# Patient Record
Sex: Female | Born: 1935 | Race: Black or African American | Hispanic: No | Smoking: Never smoker
Health system: Southern US, Community
[De-identification: ages and names within clinical notes are randomized; demographics above are authoritative.]

## PROBLEM LIST (undated history)

## (undated) DIAGNOSIS — N289 Disorder of kidney and ureter, unspecified: Secondary | ICD-10-CM

## (undated) DIAGNOSIS — Z515 Encounter for palliative care: Secondary | ICD-10-CM

## (undated) DIAGNOSIS — F0390 Unspecified dementia without behavioral disturbance: Secondary | ICD-10-CM

## (undated) DIAGNOSIS — E7800 Pure hypercholesterolemia, unspecified: Secondary | ICD-10-CM

## (undated) DIAGNOSIS — I639 Cerebral infarction, unspecified: Secondary | ICD-10-CM

## (undated) DIAGNOSIS — M1990 Unspecified osteoarthritis, unspecified site: Secondary | ICD-10-CM

## (undated) DIAGNOSIS — I10 Essential (primary) hypertension: Secondary | ICD-10-CM

## (undated) DIAGNOSIS — M1711 Unilateral primary osteoarthritis, right knee: Secondary | ICD-10-CM

## (undated) DIAGNOSIS — IMO0002 Reserved for concepts with insufficient information to code with codable children: Secondary | ICD-10-CM

## (undated) HISTORY — PX: ABDOMINAL HYSTERECTOMY: SHX81

## (undated) HISTORY — PX: TOTAL HIP ARTHROPLASTY: SHX124

---

## 2001-12-13 ENCOUNTER — Encounter: Payer: Self-pay | Admitting: Orthopedic Surgery

## 2001-12-19 ENCOUNTER — Encounter: Payer: Self-pay | Admitting: Orthopedic Surgery

## 2001-12-19 ENCOUNTER — Inpatient Hospital Stay (HOSPITAL_COMMUNITY): Admission: RE | Admit: 2001-12-19 | Discharge: 2001-12-23 | Payer: Self-pay | Admitting: Orthopedic Surgery

## 2003-02-22 ENCOUNTER — Emergency Department (HOSPITAL_COMMUNITY): Admission: AD | Admit: 2003-02-22 | Discharge: 2003-02-22 | Payer: Self-pay | Admitting: Emergency Medicine

## 2003-02-22 ENCOUNTER — Emergency Department (HOSPITAL_COMMUNITY): Admission: EM | Admit: 2003-02-22 | Discharge: 2003-02-22 | Payer: Self-pay | Admitting: *Deleted

## 2003-07-31 ENCOUNTER — Ambulatory Visit (HOSPITAL_COMMUNITY): Admission: RE | Admit: 2003-07-31 | Discharge: 2003-07-31 | Payer: Self-pay | Admitting: Gastroenterology

## 2004-02-19 ENCOUNTER — Emergency Department (HOSPITAL_COMMUNITY): Admission: EM | Admit: 2004-02-19 | Discharge: 2004-02-19 | Payer: Self-pay | Admitting: Emergency Medicine

## 2006-12-31 ENCOUNTER — Emergency Department (HOSPITAL_COMMUNITY): Admission: EM | Admit: 2006-12-31 | Discharge: 2007-01-01 | Payer: Self-pay | Admitting: Emergency Medicine

## 2007-05-23 ENCOUNTER — Encounter: Admission: RE | Admit: 2007-05-23 | Discharge: 2007-05-23 | Payer: Self-pay | Admitting: Internal Medicine

## 2009-04-15 ENCOUNTER — Encounter: Admission: RE | Admit: 2009-04-15 | Discharge: 2009-04-15 | Payer: Self-pay | Admitting: Internal Medicine

## 2009-12-24 ENCOUNTER — Emergency Department (HOSPITAL_COMMUNITY): Admission: EM | Admit: 2009-12-24 | Discharge: 2009-12-24 | Payer: Self-pay | Admitting: Emergency Medicine

## 2010-08-14 NOTE — Op Note (Signed)
NAME:  Robin Moreno, Robin Moreno                           ACCOUNT NO.:  [id number]   MEDICAL RECORD NO.:  [id number]                   PATIENT TYPE:  AMB   LOCATION:  ENDO                                 FACILITY:  [facility]   PHYSICIAN:  John C. Madilyn Fireman, M.D.                 DATE OF BIRTH:  1926-01-26   DATE OF PROCEDURE:  07/31/2003  DATE OF DISCHARGE:                                 OPERATIVE REPORT   PROCEDURE:  Colonoscopy.   INDICATION FOR PROCEDURE:  Colon cancer screening in a 77 year old patient  with no prior screening.   DESCRIPTION OF PROCEDURE:  The patient was placed in the left lateral  decubitus position and placed on the pulse monitor with continuous low-flow  oxygen delivered by nasal cannula.  She was sedated with 62.5 mcg IV  fentanyl and 6 mg IV Versed.  The Olympus video colonoscope was inserted  into the rectum and advanced to the cecum, confirmed by transillumination at  McBurney's point and visualization of the ileocecal valve and appendiceal  orifice.  The prep was excellent.  The cecum, ascending, transverse,  descending, and sigmoid colon all appeared normal with no masses, polyps,  diverticula, or other mucosal abnormalities.  The rectum likewise appeared  normal, and retroflexed view of the anus revealed no obvious internal  hemorrhoids.  The scope was then withdrawn and the patient returned to the  recovery room in stable condition.  She tolerated the procedure well, and  there were no immediate complications.   IMPRESSION:  Normal colonoscopy.   PLAN:  Next colon screening by sigmoidoscopy or Hemoccults in 5 years.                                               John C. Madilyn Fireman, M.D.    JCH/MEDQ  D:  07/31/2003  T:  07/31/2003  Job:  [id number]   cc:   Wilson Singer, M.D.  [address]  [place]  [place] [address]  Fax: [phone]

## 2010-08-14 NOTE — Discharge Summary (Signed)
NAME:  Robin Moreno, Robin Moreno NO.:  [id number]   MEDICAL RECORD NO.:  [id number]                   PATIENT TYPE:  INP   LOCATION:  5032                                 FACILITY:  [facility]   PHYSICIAN:  Lenard Galloway. Chaney Malling, M.D.           DATE OF BIRTH:  1971-08-10   DATE OF ADMISSION:  12/19/2001  DATE OF DISCHARGE:  12/23/2001                                 DISCHARGE SUMMARY   ADMISSION DIAGNOSES:  1. End-stage osteoarthritis right hip.  2. Hypertension.  3. History of hemorrhoids.  4. History of degenerative disk disease lumbar and cervical spine.  5. Osteoarthritis right knee.   DISCHARGE DIAGNOSES:  1. End-stage osteoarthritis right hip status post right hip replacement.  2. Acute blood loss anemia secondary to surgery.  3. Urinary tract infection.  4. Mild hypokalemia.  5. Constipation.  6. Hypertension.  7. History of hemorrhoids.  8. Degenerative disk disease lumbar and cervical spine.  9. Osteoarthritis right knee.   SURGICAL PROCEDURE:  On December 19, 2001 the patient underwent a right  total hip arthroplasty by Dr. Rinaldo Ratel assisted by Norlene Campbell,  M.D. and Arnoldo Morale, P.A.-C.   COMPLICATIONS:  None.   CONSULTS:  1. Rehabilitation medicine consult December 20, 2001.  2. Case management consult December 21, 2001.   HISTORY OF PRESENT ILLNESS:  This 77 year old black female patient presented  to Dr. Chaney Malling with a one year history of gradual onset of progressively  worsening right hip pain.  The pain is intermittent, located in the right  groin and trochanter area without radiation.  It increases at night and  decreases with decreasing her activity.  She does have to ambulate with a  cane.  She has failed conservative treatment and because of that she is  presenting for a right hip replacement.   HOSPITAL COURSE:  The patient tolerated her surgical procedure well without  immediate postoperative complications.  She  was transferred to 5000.  On  postoperative day one she was afebrile, vital signs stable.  Pain was well  controlled with PCA.  Hemoglobin was 9.1 with hematocrit of 27.2.  She was  noted on her admission laboratories to have a positive nitrite and leukocyte  esterase on her urinalysis and it is felt she had a UTI and we continued her  Ancef for another 24 hours.  She was started on therapy per protocol.   She continued to do well over the next several days.  She had no symptoms of  urinary tract infection after Foley was discontinued.  Her hemoglobin did  drop to 8.7 with hematocrit of 25.9 on September 25 and she did complain of  some lightheadedness when sitting or quick to move from a sitting to a  standing position.  She was subsequently transfused with 2 units of packed  red blood cells.  She also was noted to have low potassium at the time  with  3.4 and she was given p.o. supplements.   On December 22, 2001 she was doing well and her pain was well controlled  with medications.  Her hemoglobin had improved to 10.7 with a hematocrit of  31.5 and her potassium was 3.5.  She did have some difficulty with  constipation and that was treated with laxative.  It was felt she was ready  for discharge home and she was able to be discharged home subsequently on  September 27.   DISCHARGE INSTRUCTIONS:   DIET:  She can resume her regular pre hospitalization diet.   DISCHARGE MEDICATIONS:  She can resume her pre hospitalization medications  which included:  1. Atenolol/hydrochlorothiazide half tablet p.o. q.d.  2. Bextra 20 mg p.o. q.d.  3. Additional medications include Erixtra 2.5 mg subcutaneously q.d. at 8     p.m. with the last dose on September 29.  4. On September 30 she is to start one baby aspirin a day for a month.  5. OxyContin 10 mg p.o. q.12h. number 24.  6. Percocet 5/325 mg one to two p.o. q.4h. p.r.n. for pain, 50 with no     refill.  7. She is to use a laxative as  needed for constipation.   ACTIVITY:  She is to be out of bed, partial weightbearing 50% on the right  leg with the use of a walker.  She is arranged for home health PT per  [facility].   WOUND CARE:  She needs to keep her incision clean and dry.  May shower after  no drainage from the wound for two days.  She is to notify Dr. Chaney Malling of  temperature greater than or equal to 101.5 degrees Fahrenheit, chills, pain  unrelieved by pain medications, or foul smelling drainage from the wound.   FOLLOW UP:  She needs to follow up with Dr. Chaney Malling in our office on  January 01, 2002.  Needs to call [phone] for that appointment.   RADIOLOGIC FINDINGS:  X-rays taken of her pelvis on December 19, 2001  showed the right hip replacement in good position without evidence of  complication.  A repeat lateral view was recommended just to confirm  anatomic location of the prosthesis.   On September 24 hemoglobin 9.1, hematocrit 27.2.  On September 25 hemoglobin  8.7, hematocrit 25.9.  On September 26 white count 13.9, hemoglobin 10.7,  hematocrit 31.5.   On September 17 BUN 24, creatinine 1.2.  On the 24th glucose 151, calcium  8.1.  On September 25 sodium 138, potassium 3.4, glucose 134.  On September  26 sodium 138, potassium 3.5, chloride 103, CO2 29, glucose 105, BUN 10,  creatinine 1.1, and calcium 8.4.   On September 17 urinalysis showed positive nitrite, trace leukocyte  esterase, few epithelials, 0-2 white cells and red cells and too numerous to  count bacteria.  Urine culture that was obtained on September 26 showed  greater than 100,000 colonies per ml of multiple species and it was felt it  was a contaminate.     Legrand Pitts Duffy, P.A.                      Rodney A. Chaney Malling, M.D.    KED/MEDQ  D:  01/10/2002  T:  01/11/2002  Job:  [id number]

## 2010-08-14 NOTE — Op Note (Signed)
NAME:  Robin Moreno, Robin Moreno                           ACCOUNT NO.:  [id number]   MEDICAL RECORD NO.:  [id number]                   PATIENT TYPE:  INP   LOCATION:  2550                                 FACILITY:  [facility]   PHYSICIAN:  Lenard Galloway. Chaney Malling, M.D.           DATE OF BIRTH:  1925/07/03   DATE OF PROCEDURE:  12/19/2001  DATE OF DISCHARGE:                                 OPERATIVE REPORT   PREOPERATIVE DIAGNOSES:  Osteoarthritis right hip.   POSTOPERATIVE DIAGNOSES:  Osteoarthritis right hip.   OPERATION PERFORMED:  Total hip replacement on the right using an AML 3.5  diameter stem with a large proximal end; a 52 mm Pinnacle acetabular cup,  hole eliminator and +4 acetabular liner with 10 degree lip, 28 mm head.   SURGEON:  Lenard Galloway. Chaney Malling, M.D.   ASSISTANT:  Claude Manges. Cleophas Dunker, M.D. and Legrand Pitts. Duffy, P.A.   ANESTHESIA:  General.   DESCRIPTION OF PROCEDURE:  The patient was placed on the operating table.  After satisfactory oral endotracheal anesthesia, the patient was placed in  full lateral position with the right hip up.  The right hip was prepped with  DuraPrep and draped out in the usual manner.  A ViDrape was placed over the  operative site.  The posterior Southern incision was made.  Skin edges were  retracted.  Bleeders were coagulated.  Tensor fascia lata was split and the  neck was exposed.  An incision was made with the cutting cautery at the base  of the neck starting superiorly and carried inferiorly.  A T-cut was then  made in the capsule up the neck to the labrum.  The hip was then dislocated.  Using  the cutting guide, the neck was then cut with a power saw at the  appropriate length and angle.  Excellent position was achieved.  Using a  series of fully fluted reamers, the femur was reamed out to a 13 mm diameter  and there was good tight fitting of the reamer.  A series of broaches then  passed down the femoral canal until a standard size 13.5 mm diameter  seated  very nicely in the calcar.   Attention was turned to the acetabulum.  Retractors were placed about the  rim of the acetabulum.  Part of the labrum was excised.  A series of cheese  cut reamers was used and this was reamed out to a 51 mm diameter.  Trials  were placed on the hip and 52 mm diameter cup, appeared to seat very nicely.  The hip was irrigated with copious amounts of saline solution followed by  antibiotic solution.  The final pore coated acetabulum was then driven down  into the acetabulum and had a wonderful scratch fit and this was extremely  stable.  There was a large area of osteophytes which was removed with an  osteotome.  A +4 liner trial was  inserted and then trial femoral components  passed down the femoral canal.  A 1.5 mm head was put in place and the hip  was reduced.  This had excellent range of motion and nice stability. The leg  lengths were equal.  All components were then removed.  The acetabulum was  driven down to its final position with excellent stability.  Again  antibiotic solution used to irrigate the hip.  The Apex hole eliminator was  inserted.  The +4 Pinnacle acetabular liner was inserted snap fit in place.  This was tested for stability and was completely locked in position.  The  final  femoral prosthesis then driven down the femoral canal and seated very  nicely in calcar.  We elected to place a +5 ball on the neck.  The hip was  reduced.  The hip was extremely stable and leg lengths were equal.  There  was little if any bleeding.  The posterior capsule was then closed with  interrupted Ethibond suture.  The piriformis was reattached.  The tensor  fascia lata was reapproximated with interrupted Ethibond sutures, Vicryl was  used to close subcutaneous tissues.  Skin staples used to close the skin.  Dry sterile dressings applied.  The patient was then transferred to the  recovery room excellent condition.  Technically, this went extremely  well.   DRAINS:  None.   COMPLICATIONS:  None.                                                  Rodney A. Chaney Malling, M.D.    RAM/MEDQ  D:  12/19/2001  T:  12/20/2001  Job:  [id number]

## 2010-09-29 ENCOUNTER — Other Ambulatory Visit: Payer: Self-pay | Admitting: Internal Medicine

## 2010-09-29 DIAGNOSIS — Z1231 Encounter for screening mammogram for malignant neoplasm of breast: Secondary | ICD-10-CM

## 2010-10-14 ENCOUNTER — Ambulatory Visit
Admission: RE | Admit: 2010-10-14 | Discharge: 2010-10-14 | Disposition: A | Discharge status: Home / Self Care | Payer: Medicare Other | Source: Ambulatory Visit | Attending: Internal Medicine | Admitting: Internal Medicine

## 2010-10-14 DIAGNOSIS — Z1231 Encounter for screening mammogram for malignant neoplasm of breast: Secondary | ICD-10-CM

## 2011-02-20 ENCOUNTER — Encounter: Payer: Self-pay | Admitting: *Deleted

## 2011-02-20 ENCOUNTER — Emergency Department (HOSPITAL_COMMUNITY): Payer: Medicare Other

## 2011-02-20 ENCOUNTER — Emergency Department (HOSPITAL_COMMUNITY)
Admission: EM | Admit: 2011-02-20 | Discharge: 2011-02-20 | Disposition: A | Discharge status: Home / Self Care | Payer: Medicare Other | Attending: Emergency Medicine | Admitting: Emergency Medicine

## 2011-02-20 DIAGNOSIS — G8929 Other chronic pain: Secondary | ICD-10-CM | POA: Insufficient documentation

## 2011-02-20 DIAGNOSIS — M129 Arthropathy, unspecified: Secondary | ICD-10-CM | POA: Insufficient documentation

## 2011-02-20 DIAGNOSIS — Z7982 Long term (current) use of aspirin: Secondary | ICD-10-CM | POA: Insufficient documentation

## 2011-02-20 DIAGNOSIS — M25569 Pain in unspecified knee: Secondary | ICD-10-CM | POA: Insufficient documentation

## 2011-02-20 DIAGNOSIS — E7800 Pure hypercholesterolemia, unspecified: Secondary | ICD-10-CM | POA: Insufficient documentation

## 2011-02-20 DIAGNOSIS — Z79899 Other long term (current) drug therapy: Secondary | ICD-10-CM | POA: Insufficient documentation

## 2011-02-20 DIAGNOSIS — M1711 Unilateral primary osteoarthritis, right knee: Secondary | ICD-10-CM

## 2011-02-20 DIAGNOSIS — M1710 Unilateral primary osteoarthritis, unspecified knee: Secondary | ICD-10-CM | POA: Insufficient documentation

## 2011-02-20 DIAGNOSIS — W010XXA Fall on same level from slipping, tripping and stumbling without subsequent striking against object, initial encounter: Secondary | ICD-10-CM | POA: Insufficient documentation

## 2011-02-20 DIAGNOSIS — I10 Essential (primary) hypertension: Secondary | ICD-10-CM | POA: Insufficient documentation

## 2011-02-20 HISTORY — DX: Essential (primary) hypertension: I10

## 2011-02-20 HISTORY — DX: Pure hypercholesterolemia, unspecified: E78.00

## 2011-02-20 HISTORY — DX: Reserved for concepts with insufficient information to code with codable children: IMO0002

## 2011-02-20 HISTORY — DX: Unspecified osteoarthritis, unspecified site: M19.90

## 2011-02-20 HISTORY — DX: Unilateral primary osteoarthritis, right knee: M17.11

## 2011-02-20 MED ORDER — HYDROCODONE-ACETAMINOPHEN 5-325 MG PO TABS: 1.0000 | ORAL_TABLET | Freq: Four times a day (QID) | ORAL | Status: DC | PRN

## 2011-02-20 NOTE — ED Provider Notes (Signed)
History     CSN: [id number] Arrival date & time: 02/20/2011 11:16 AM    Chief Complaint  Patient presents with  . Knee Pain    HPI Pt was seen at 1150.  Per pt, c/o gradual onset and persistence of constant acute flair of her chronic right knee pain "for a while," worse over the past few weeks when she slipped and fell on it.  Has hx of arthritis and walks with a cane. Pain worsens when she "walks a lot."  Has been taking tramadol without relief.  Denies hip pain, no back pain, no fevers, no rash, no focal motor weakness, no tingling/numbness in extremities.     Ortho:  Dr. Chaney Malling Past Medical History  Diagnosis Date  . Hypertension   . Hypercholesterolemia   . Arthritis   . Osteoarthritis of right knee   . Degenerative disk disease     Past Surgical History  Procedure Date  . Abdominal hysterectomy   . Total hip arthroplasty      History  Substance Use Topics  . Smoking status: Never Smoker   . Smokeless tobacco: Not on file  . Alcohol Use: No    Review of Systems ROS: Statement: All systems negative except as marked or noted in the HPI; Constitutional: Negative for fever and chills. ; ; Eyes: Negative for eye pain, redness and discharge. ; ; ENMT: Negative for ear pain, hoarseness, nasal congestion, sinus pressure and sore throat. ; ; Cardiovascular: Negative for chest pain, palpitations, diaphoresis, dyspnea and peripheral edema. ; ; Respiratory: Negative for cough, wheezing and stridor. ; ; Gastrointestinal: Negative for nausea, vomiting, diarrhea and abdominal pain, blood in stool, hematemesis, jaundice and rectal bleeding. . ; ; Genitourinary: Negative for dysuria, flank pain and hematuria. ; ; Musculoskeletal: +right knee pain. Negative for back pain and neck pain. Negative for swelling and trauma.; ; Skin: Negative for pruritus, rash, abrasions, blisters, bruising and skin lesion.; ; Neuro: Negative for headache, lightheadedness and neck stiffness. Negative for  weakness, altered level of consciousness , altered mental status, extremity weakness, paresthesias, involuntary movement, seizure and syncope.     Allergies  Review of patient's allergies indicates not on file.  Home Medications   Current Outpatient Rx  Name Route Sig Dispense Refill  . ASPIRIN EC 81 MG PO TBEC Oral Take 81 mg by mouth daily.      Marland Kitchen VITAMIN D 2000 UNITS PO TABS Oral Take 2,000 Units by mouth daily.      Marland Kitchen DILTIAZEM HCL 60 MG PO TABS Oral Take 60 mg by mouth 2 (two) times daily.      Marland Kitchen HYDROCHLOROTHIAZIDE 25 MG PO TABS Oral Take 25 mg by mouth daily.      Marland Kitchen SIMVASTATIN 40 MG PO TABS Oral Take 40 mg by mouth at bedtime.      . TRAMADOL HCL 50 MG PO TABS Oral Take 50 mg by mouth every 8 (eight) hours as needed. Maximum dose= 8 tablets per day, for pain       BP 148/70  Pulse 83  Temp(Src) 98.3 F (36.8 C) (Oral)  Resp 16  SpO2 97%  Physical Exam 1155: Physical examination:  Nursing notes reviewed; Vital signs and O2 SAT reviewed;  Constitutional: Well developed, Well nourished, Well hydrated, In no acute distress; Head:  Normocephalic, atraumatic; Eyes: EOMI, PERRL, No scleral icterus; ENMT: Mouth and pharynx normal, Mucous membranes moist; Neck: Supple, Full range of motion, No lymphadenopathy; Cardiovascular: Regular rate and rhythm, No murmur, rub,  or gallop; Respiratory: Breath sounds clear & equal bilaterally, No rales, rhonchi, wheezes, or rub, Normal respiratory effort/excursion; Chest: Nontender, Movement normal; Extremities: Pulses normal, No edema, No calf edema or asymmetry. +FROM right knee, including able to lift extended RLE off stretcher, and extend right lower leg against resistance.  No ligamentous laxity.  No patellar or quad tendon step-offs.  NMS intact right foot, strong pedal pp.  No proximal fibular head tenderness.  No edema, erythema, warmth or ecchymosis.  No specific area of point tenderness.; Neuro: AA&Ox3, Major CN grossly intact.  No gross focal  motor or sensory deficits in extremities. Gait steady.; Skin: Color normal, Warm, Dry, no rash.   ED Course  Procedures   MDM  MDM Reviewed: nursing note and vitals Interpretation: x-ray   Dg Knee Complete 4 Views Right  02/20/2011  *RADIOLOGY REPORT*  Clinical Data: Fall.  Worsening pain for 1 day.  Trauma 02/08/2011. Pain distal and anterior of patella.  RIGHT KNEE - COMPLETE 4+ VIEW  Comparison: None.  Findings: Severe medial compartment and moderate lateral compartment joint space narrowing and subchondral sclerosis/osteophyte formation.  Severe patellofemoral osteoarthritis. No acute fracture or dislocation.  Small suprapatellar joint effusion.  IMPRESSION: Severe three compartment osteoarthritis.  Small suprapatellar joint effusion.  Original Report Authenticated By: Consuello Bossier, M.D.     1:29 PM:  XR with known OA.  Taking tramadol for pain and already using cane.  States she needs "something stronger" for pain.  Dx testing d/w pt and family.  Questions answered.  Verb understanding, agreeable to d/c home with outpt f/u.    Malonie Tatum Allison Quarry, DO 02/21/11 2356

## 2011-02-20 NOTE — ED Notes (Signed)
Patient to [facility] ED with C/O right Knee pain. States that she can stand on it but it hurts. States that she fell on her knee on Nov 12 but her knee did not bother her too much.  The pain began after Thanksgiving. States that she thinks that she was on her leg too much during the holidays.

## 2011-02-20 NOTE — ED Notes (Signed)
Patient transported to X-ray 

## 2011-02-20 NOTE — ED Notes (Signed)
Pain is worse rt. Knee. Uses a cane for walking. Takes tramadol.

## 2011-10-31 ENCOUNTER — Emergency Department (HOSPITAL_COMMUNITY)
Admission: EM | Admit: 2011-10-31 | Discharge: 2011-10-31 | Disposition: A | Discharge status: Home / Self Care | Payer: Medicare Other | Attending: Internal Medicine | Admitting: Internal Medicine

## 2011-10-31 ENCOUNTER — Encounter (HOSPITAL_COMMUNITY): Payer: Self-pay | Admitting: Nurse Practitioner

## 2011-10-31 DIAGNOSIS — K0889 Other specified disorders of teeth and supporting structures: Secondary | ICD-10-CM

## 2011-10-31 DIAGNOSIS — I10 Essential (primary) hypertension: Secondary | ICD-10-CM | POA: Insufficient documentation

## 2011-10-31 DIAGNOSIS — R6884 Jaw pain: Secondary | ICD-10-CM | POA: Insufficient documentation

## 2011-10-31 DIAGNOSIS — R209 Unspecified disturbances of skin sensation: Secondary | ICD-10-CM | POA: Insufficient documentation

## 2011-10-31 DIAGNOSIS — M1710 Unilateral primary osteoarthritis, unspecified knee: Secondary | ICD-10-CM | POA: Insufficient documentation

## 2011-10-31 DIAGNOSIS — Z96649 Presence of unspecified artificial hip joint: Secondary | ICD-10-CM | POA: Insufficient documentation

## 2011-10-31 DIAGNOSIS — K089 Disorder of teeth and supporting structures, unspecified: Secondary | ICD-10-CM | POA: Insufficient documentation

## 2011-10-31 DIAGNOSIS — E7800 Pure hypercholesterolemia, unspecified: Secondary | ICD-10-CM | POA: Insufficient documentation

## 2011-10-31 NOTE — ED Provider Notes (Signed)
Medical screening examination/treatment/procedure(s) were conducted as a shared visit with non-physician practitioner(s) and myself.  I personally evaluated the patient during the encounter 9:30 AM  Pt with funny feeling in the jaw after putting dentures in this morning. She also had some palpitations. She has a history of hypertension, she became worried, and called EMS. Exam shows her to be an elderly lady in no distress. Oropharynx is edentulous, with no evidence of oropharyngeal mass or infection. No carotid bruits. Heart and lungs negative. Neuro exam physiological. Advised to rest at home today, call dentist tomorrow to be evaluated for new dentures.       Carleene Cooper III, MD 10/31/11 1728

## 2011-10-31 NOTE — Progress Notes (Signed)
9:30 AM Pt with funny feeling in the jaw after putting dentures in this morning.  She also had some palpitations. She has a history of hypertension, she became worried, and called EMS.  Exam shows her to be an elderly lady in no distress.  Oropharynx is edentulous, with no evidence of oropharyngeal mass or infection.  No carotid bruits.  Heart and lungs negative.  Neuro exam physiological.  Advised to rest at home today, call dentist tomorrow to be evaluated for new dentures.

## 2011-10-31 NOTE — ED Provider Notes (Signed)
History     CSN: [id number]  Arrival date & time 10/31/11  0818   First MD Initiated Contact with Patient 10/31/11 0827      Chief Complaint  Patient presents with  . Numbness    (Consider location/radiation/quality/duration/timing/severity/associated sxs/prior treatment) HPI  This is an 78 year old woman with a past medical history of hypertension and hypercholesterolemia, who presents with " feeling funny on my lower jaw" which started at around 7:00am this morning. It started when she had just taken out of trash. It is not clear whether it was numbness. At the time when I interviewed in ED, she had removed her dentures and the funny feeling had completely resolved. He believes that this could be related to her dentures especially when she uses them. She reports some palpitations but no dizziness, no shortness of breath, no passing out, no falls, and no difficulty in speech. No amaurosis fugax. She denies feeling weak in any of her limbs. She has had similar episodes for the last year or two.   Her blood pressure in the ED, was 180/70. The one taken at home and arrival of ambulance was 200/100. There is no previous history of a stroke.  Past Medical History  Diagnosis Date  . Hypertension   . Hypercholesterolemia   . Arthritis   . Osteoarthritis of right knee   . Degenerative disk disease     Past Surgical History  Procedure Date  . Abdominal hysterectomy   . Total hip arthroplasty     No family history on file.  History  Substance Use Topics  . Smoking status: Never Smoker   . Smokeless tobacco: Not on file  . Alcohol Use: No    OB History    Grav Para Term Preterm Abortions TAB SAB Ect Mult Living                  Review of Systems  Constitutional: Negative.   HENT: Negative for hearing loss, drooling, trouble swallowing and voice change.   Eyes: Negative for visual disturbance.  Respiratory: Negative for apnea, chest tightness and shortness of breath.     Cardiovascular: Positive for palpitations. Negative for chest pain and leg swelling.  Gastrointestinal: Negative.   Genitourinary: Negative.   Musculoskeletal: Positive for back pain and arthralgias.  Skin: Negative.   Neurological: Negative for dizziness, tremors, seizures, syncope, facial asymmetry, speech difficulty, weakness, light-headedness and headaches.  Psychiatric/Behavioral: Negative.       Allergies  Review of patient's allergies indicates no known allergies.  Home Medications   Current Outpatient Rx  Name Route Sig Dispense Refill  . ASPIRIN EC 81 MG PO TBEC Oral Take 81 mg by mouth daily.      Marland Kitchen VITAMIN D 2000 UNITS PO TABS Oral Take 2,000 Units by mouth daily.      Marland Kitchen DILTIAZEM HCL 60 MG PO TABS Oral Take 60 mg by mouth 2 (two) times daily.      Marland Kitchen HYDROCHLOROTHIAZIDE 25 MG PO TABS Oral Take 25 mg by mouth daily.      . ADULT MULTIVITAMIN W/MINERALS CH Oral Take 1 tablet by mouth daily.    Marland Kitchen NAPROXEN SODIUM 220 MG PO TABS Oral Take 220 mg by mouth daily as needed. For pain    . SIMVASTATIN 40 MG PO TABS Oral Take 40 mg by mouth at bedtime.      . TRAMADOL HCL 50 MG PO TABS Oral Take 50 mg by mouth every 8 (eight) hours as needed. Maximum  dose= 8 tablets per day, for pain       BP 177/59  Pulse 68  Temp 98.1 F (36.7 C) (Oral)  Resp 22  SpO2 96%  Physical Exam  Constitutional: She is oriented to person, place, and time. She appears well-developed and well-nourished.  HENT:  Head: Normocephalic and atraumatic.  Mouth/Throat: Uvula is midline.       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  Eyes: Conjunctivae and EOM are normal. Pupils are equal, round, and reactive to light.  Neck: Normal range of motion. Neck supple.  Cardiovascular: Normal rate, regular rhythm, normal heart sounds and intact distal pulses.   No murmur heard. Pulmonary/Chest: No respiratory distress. She has no rales.  Abdominal: Soft. Bowel sounds are normal.  She exhibits no distension.  Musculoskeletal: She exhibits no edema and no tenderness.  Neurological: She is alert and oriented to person, place, and time. She has normal strength. She displays normal reflexes. No cranial nerve deficit or sensory deficit. She exhibits normal muscle tone. She displays a negative Romberg sign. Coordination normal. GCS eye subscore is 4. GCS verbal subscore is 5. GCS motor subscore is 6.  Skin: Skin is warm.  Psychiatric: She has a normal mood and affect.    ED Course  Procedures (including critical care time)  EKG: normal sinus rhythm, normal ST segment changes.   MDM  This patient's numbness of the lower jaw is most likely related to her dentures. There is no clear history to suggest stroke given her normal neurological exam even though she has risk factors including high blood pressure and hypercholesteremia. I believe this is a dental problem as opposed to a stroke or cardiac cause. Her BP could have raised secondary to this incident. She will follow up with her dentist to have the dentures changed. She has been encouraged to have a rest at home and relax.      Dow Adolph, MD 10/31/11 [phone]

## 2011-10-31 NOTE — ED Notes (Signed)
0725. Lower jaw numbness after taking trash out. Does wear lower dentures with hx. Of not fitting right. No cp, no sob, no syncope, neg. For stroke scale. Ambulatory on own.

## 2011-10-31 NOTE — ED Provider Notes (Signed)
Date: 10/31/2011  Rate: 64  Rhythm: normal sinus rhythm  QRS Axis: normal  Intervals: normal  ST/T Wave abnormalities: normal  Conduction Disutrbances:none  Narrative Interpretation: Normal EKG  Old EKG Reviewed: unchanged    Carleene Cooper III, MD 10/31/11 [phone]

## 2011-11-04 ENCOUNTER — Encounter (HOSPITAL_COMMUNITY): Payer: Self-pay | Admitting: Adult Health

## 2011-11-04 ENCOUNTER — Emergency Department (HOSPITAL_COMMUNITY)
Admission: EM | Admit: 2011-11-04 | Discharge: 2011-11-04 | Disposition: A | Discharge status: Home / Self Care | Payer: Medicare Other | Attending: Emergency Medicine | Admitting: Emergency Medicine

## 2011-11-04 ENCOUNTER — Emergency Department (HOSPITAL_COMMUNITY): Payer: Medicare Other

## 2011-11-04 DIAGNOSIS — E7800 Pure hypercholesterolemia, unspecified: Secondary | ICD-10-CM | POA: Insufficient documentation

## 2011-11-04 DIAGNOSIS — G8929 Other chronic pain: Secondary | ICD-10-CM

## 2011-11-04 DIAGNOSIS — M25519 Pain in unspecified shoulder: Secondary | ICD-10-CM | POA: Insufficient documentation

## 2011-11-04 DIAGNOSIS — M129 Arthropathy, unspecified: Secondary | ICD-10-CM | POA: Insufficient documentation

## 2011-11-04 DIAGNOSIS — IMO0002 Reserved for concepts with insufficient information to code with codable children: Secondary | ICD-10-CM | POA: Insufficient documentation

## 2011-11-04 DIAGNOSIS — I10 Essential (primary) hypertension: Secondary | ICD-10-CM | POA: Insufficient documentation

## 2011-11-04 LAB — CBC
HCT: 40.9 % (ref 36.0–46.0)
Hemoglobin: 13.7 g/dL (ref 12.0–15.0)
MCH: 31.1 pg (ref 26.0–34.0)
MCHC: 33.5 g/dL (ref 30.0–36.0)
MCV: 92.7 fL (ref 78.0–100.0)
Platelets: 230 10*3/uL (ref 150–400)
RBC: 4.41 MIL/uL (ref 3.87–5.11)
RDW: 13.1 % (ref 11.5–15.5)
WBC: 5.9 10*3/uL (ref 4.0–10.5)

## 2011-11-04 LAB — BASIC METABOLIC PANEL
BUN: 25 mg/dL — ABNORMAL HIGH (ref 6–23)
CO2: 27 mEq/L (ref 19–32)
Calcium: 9.8 mg/dL (ref 8.4–10.5)
Chloride: 102 mEq/L (ref 96–112)
Creatinine, Ser: 0.99 mg/dL (ref 0.50–1.10)
GFR calc Af Amer: 59 mL/min — ABNORMAL LOW (ref >90)
GFR calc non Af Amer: 51 mL/min — ABNORMAL LOW (ref >90)
Glucose, Bld: 93 mg/dL (ref 70–99)
Potassium: 4 mEq/L (ref 3.5–5.1)
Sodium: 138 mEq/L (ref 135–145)

## 2011-11-04 LAB — POCT I-STAT TROPONIN I: Troponin i, poc: 0.01 ng/mL (ref 0.00–0.08)

## 2011-11-04 MED ORDER — NAPROXEN 250 MG PO TABS
375.0000 mg | ORAL_TABLET | Freq: Once | ORAL | Status: AC
  Administered 2011-11-04: 375 mg via ORAL
  Filled 2011-11-04: qty 2

## 2011-11-04 MED ORDER — NAPROXEN 375 MG PO TABS: 375.0000 mg | ORAL_TABLET | Freq: Two times a day (BID) | ORAL | Status: DC

## 2011-11-04 NOTE — ED Provider Notes (Signed)
  Physical Exam  BP 131/67  Pulse 71  Temp 98.8 F (37.1 C) (Oral)  Resp 16  SpO2 98%  Physical Exam Awaiting troponin results if negative patient can be DC home with DX chronic shoulder pain  ED Course  Procedures  MDM Troponin negative  Joslyn Devon states she is still slightly, uncomfortable, stating that only tends to help her discomfort, more than the tramadol and she would like a prescription for the same      Arman Filter, NP 11/04/11 2102

## 2011-11-04 NOTE — ED Notes (Signed)
C/o left shoulder pain that has been ongoing for 4-5 years. PT wants her shoulder checked and c/o left sided pain. Pt has a ruptured disc in back and wants that checked as well. Alert, oriented, answers all questions. Denies chest pain, denies SOB, denies nausea. No neuro deficits.

## 2011-11-04 NOTE — ED Provider Notes (Addendum)
History  Scribed for Robin Kras, MD, the patient was seen in room TR06C/TR06C. This chart was scribed by Candelaria Stagers. The patient's care started at 6:37 PM   CSN: [id number]  Arrival date & time 11/04/11  1707   First MD Initiated Contact with Patient 11/04/11 1826      Chief Complaint  Patient presents with  . Shoulder Pain     The history is provided by the patient.   Robin Moreno is a 78 y.o. female who presents to the Emergency Department complaining of chronic left shoulder pain that has gotten worse today.  The pain is worse with movement.  She is experiencing no other sx.    Patient denies any chest pain or shortness of breath. She has not had any nausea or vomiting. Sometimes movements make the pain a little bit worse.  Ultram helps sometimes.  Past Medical History  Diagnosis Date  . Hypertension   . Hypercholesterolemia   . Arthritis   . Osteoarthritis of right knee   . Degenerative disk disease     Past Surgical History  Procedure Date  . Abdominal hysterectomy   . Total hip arthroplasty     History reviewed. No pertinent family history.  History  Substance Use Topics  . Smoking status: Never Smoker   . Smokeless tobacco: Not on file  . Alcohol Use: No    OB History    Grav Para Term Preterm Abortions TAB SAB Ect Mult Living                  Review of Systems  Constitutional: Negative for fever.       10 Systems reviewed and are negative for acute change except as noted in the HPI.  HENT: Negative for congestion.   Eyes: Negative for discharge and redness.  Respiratory: Negative for cough and shortness of breath.   Cardiovascular: Negative for chest pain.  Gastrointestinal: Negative for vomiting and abdominal pain.  Musculoskeletal: Positive for arthralgias (left shoulder). Negative for back pain.  Skin: Negative for rash.  Neurological: Negative for syncope, numbness and headaches.  Psychiatric/Behavioral:       No behavior change.     Allergies  Review of patient's allergies indicates no known allergies.  Home Medications   Current Outpatient Rx  Name Route Sig Dispense Refill  . ASPIRIN EC 81 MG PO TBEC Oral Take 81 mg by mouth daily.      Marland Kitchen VITAMIN D 2000 UNITS PO TABS Oral Take 2,000 Units by mouth daily.      Marland Kitchen DILTIAZEM HCL 60 MG PO TABS Oral Take 60 mg by mouth 2 (two) times daily.      Marland Kitchen HYDROCHLOROTHIAZIDE 25 MG PO TABS Oral Take 25 mg by mouth daily.      . ADULT MULTIVITAMIN W/MINERALS CH Oral Take 1 tablet by mouth daily.    Marland Kitchen NAPROXEN SODIUM 220 MG PO TABS Oral Take 220 mg by mouth daily as needed. For pain    . SIMVASTATIN 40 MG PO TABS Oral Take 40 mg by mouth at bedtime.      . TRAMADOL HCL 50 MG PO TABS Oral Take 50 mg by mouth every 8 (eight) hours as needed. Maximum dose= 8 tablets per day, for pain       BP 131/67  Pulse 71  Temp 98.8 F (37.1 C) (Oral)  Resp 16  SpO2 98%  Physical Exam  Nursing note and vitals reviewed. Constitutional: She appears well-developed and  well-nourished. No distress.  HENT:  Head: Normocephalic and atraumatic.  Right Ear: External ear normal.  Left Ear: External ear normal.  Eyes: Conjunctivae are normal. Right eye exhibits no discharge. Left eye exhibits no discharge. No scleral icterus.  Neck: Neck supple. No tracheal deviation present.  Cardiovascular: Normal rate, regular rhythm and intact distal pulses.   Pulmonary/Chest: Effort normal and breath sounds normal. No stridor. No respiratory distress. She has no wheezes. She has no rales.  Abdominal: Soft. Bowel sounds are normal. She exhibits no distension. There is no tenderness. There is no rebound and no guarding.  Musculoskeletal: She exhibits no edema and no tenderness.       Left shoulder: She exhibits no swelling, no effusion and no crepitus.       Cervical back: She exhibits no swelling and no edema.  Neurological: She is alert. She has normal strength. No sensory deficit. Cranial nerve deficit:   no gross defecits noted. She exhibits normal muscle tone. She displays no seizure activity. Coordination normal.  Skin: Skin is warm and dry. No rash noted.  Psychiatric: She has a normal mood and affect.    ED Course  Procedures   Rate: 56  Rhythm: Sinus bradycardia with first degree AV block  QRS Axis: normal  Intervals: normal  ST/T Wave abnormalities: normal  Conduction Disutrbances:none  Narrative Interpretation:   Old EKG Reviewed: none available  DIAGNOSTIC STUDIES: Oxygen Saturation is 98% on room air, normal by my interpretation.    COORDINATION OF CARE:  18:43 Ordered: DG Shoulder Left: Basic metabolic panel; CBC; i-Stat troponin; ED EKG   Labs Reviewed - No data to display Dg Shoulder Left  11/04/2011  *RADIOLOGY REPORT*  Clinical Data: Worsening shoulder pain.  LEFT SHOULDER - 2+ VIEW  Comparison: 12/31/2006  Findings: No evidence of fracture or dislocation.  Spurring is again seen along the greater tuberosity at the greater cuff insertion and acromioclavicular joint. There is increased size of the spurring along the undersurface of acromion and proximal ossification within the rotator cuff tendon.  Rotator cuff calcific tendonitis cannot be excluded.  These are also predisposing factors for shoulder impingement syndrome.  IMPRESSION:  1.  No acute findings. 2.  Predisposing factors for shoulder impingement syndrome, as described above.  Calcific tendonitis of rotator cuff cannot be excluded.  Consider shoulder MRI for further evaluation.  Original Report Authenticated By: Danae Orleans, M.D.        MDM  Patient has had off-and-on pain in her left shoulder for about 4-5 years. She has history of having trouble with ruptured discs as well. Patient has been taking Ultram for the pain but today was the worst.  Doubt cardiac, pulmonary etiology. I personally performed the services described in this documentation, which was scribed in my presence.  The recorded information  has been reviewed and considered.         Robin Kras, MD 11/04/11 Barry Brunner  Robin Kras, MD 11/04/11 2007

## 2011-11-05 ENCOUNTER — Emergency Department (HOSPITAL_COMMUNITY)
Admission: EM | Admit: 2011-11-05 | Discharge: 2011-11-05 | Disposition: A | Discharge status: Home / Self Care | Payer: Medicare Other | Attending: Emergency Medicine | Admitting: Emergency Medicine

## 2011-11-05 ENCOUNTER — Emergency Department (HOSPITAL_COMMUNITY): Payer: Medicare Other

## 2011-11-05 ENCOUNTER — Encounter (HOSPITAL_COMMUNITY): Payer: Self-pay | Admitting: Emergency Medicine

## 2011-11-05 DIAGNOSIS — Z7982 Long term (current) use of aspirin: Secondary | ICD-10-CM | POA: Insufficient documentation

## 2011-11-05 DIAGNOSIS — M1990 Unspecified osteoarthritis, unspecified site: Secondary | ICD-10-CM

## 2011-11-05 DIAGNOSIS — I10 Essential (primary) hypertension: Secondary | ICD-10-CM | POA: Insufficient documentation

## 2011-11-05 DIAGNOSIS — M169 Osteoarthritis of hip, unspecified: Secondary | ICD-10-CM | POA: Insufficient documentation

## 2011-11-05 DIAGNOSIS — E7800 Pure hypercholesterolemia, unspecified: Secondary | ICD-10-CM | POA: Insufficient documentation

## 2011-11-05 DIAGNOSIS — M25519 Pain in unspecified shoulder: Secondary | ICD-10-CM | POA: Insufficient documentation

## 2011-11-05 DIAGNOSIS — Z79899 Other long term (current) drug therapy: Secondary | ICD-10-CM | POA: Insufficient documentation

## 2011-11-05 DIAGNOSIS — Z96649 Presence of unspecified artificial hip joint: Secondary | ICD-10-CM | POA: Insufficient documentation

## 2011-11-05 DIAGNOSIS — M1610 Unilateral primary osteoarthritis, unspecified hip: Secondary | ICD-10-CM | POA: Insufficient documentation

## 2011-11-05 NOTE — ED Notes (Addendum)
Patient here with continuing left shoulder pain and left hip pain.  Patient was seen here 6 hours ago for same, did not fill her pain medication prescription after being discharged.  Patient states she had an xray of shoulder but did not have one of her hip.  Patient was ambulatory at home and at discharge from this facility.  Patient came by ambulance due to not wanting son driving her vehicle.

## 2011-11-05 NOTE — ED Notes (Signed)
Went to check for patient again; patient not present in room.  According to tech, patient left and did not wait on discharge paperwork.  Discharge paperwork given to secretary and attempted to call patient to come and pick up discharge paperwork.

## 2011-11-05 NOTE — ED Notes (Signed)
Patient left with discharge paperwork from an earlier visit. Patient didn't wait for the new discharge papers as well as had NO noticeable distress and commenced to leave with the assistance of her son.

## 2011-11-05 NOTE — ED Notes (Signed)
Patient here for left hip pain and continued left shoulder pain.  Patient seen here earlier in the evening for same.

## 2011-11-05 NOTE — ED Notes (Signed)
Received discharge paperwork from EDP; went to discharge patient.  Patient not present in room.  Will check for patient again.

## 2011-11-05 NOTE — ED Provider Notes (Addendum)
History     CSN: [id number]  Arrival date & time 11/05/11  0251   First MD Initiated Contact with Patient 11/05/11 0455      Chief Complaint  Patient presents with  . Hip Pain  . Shoulder Pain    (Consider location/radiation/quality/duration/timing/severity/associated sxs/prior treatment) HPI Comments: Patient with Hx arthritis seen by PCP but unsatisfied with diagnosis wants a second opinion   The history is provided by the patient.    Past Medical History  Diagnosis Date  . Hypertension   . Hypercholesterolemia   . Arthritis   . Osteoarthritis of right knee   . Degenerative disk disease     Past Surgical History  Procedure Date  . Abdominal hysterectomy   . Total hip arthroplasty     No family history on file.  History  Substance Use Topics  . Smoking status: Never Smoker   . Smokeless tobacco: Not on file  . Alcohol Use: No    OB History    Grav Para Term Preterm Abortions TAB SAB Ect Mult Living                  Review of Systems  Constitutional: Negative.   HENT: Negative.   Eyes: Negative.   Respiratory: Negative for shortness of breath.   Cardiovascular: Negative for chest pain and leg swelling.  Musculoskeletal: Positive for joint swelling and gait problem. Negative for back pain.  Skin: Negative for rash and wound.    Allergies  Review of patient's allergies indicates no known allergies.  Home Medications   Current Outpatient Rx  Name Route Sig Dispense Refill  . ASPIRIN EC 81 MG PO TBEC Oral Take 81 mg by mouth every morning.     Marland Kitchen VITAMIN D 2000 UNITS PO TABS Oral Take 2,000 Units by mouth daily.      Marland Kitchen DILTIAZEM HCL 60 MG PO TABS Oral Take 60 mg by mouth 2 (two) times daily.      Marland Kitchen HYDROCHLOROTHIAZIDE 25 MG PO TABS Oral Take 25 mg by mouth daily.      . ADULT MULTIVITAMIN W/MINERALS CH Oral Take 1 tablet by mouth daily.    Frazier Butt OP Both Eyes Place 1 drop into both eyes every morning.    Marland Kitchen SIMVASTATIN 40 MG PO TABS Oral Take 40  mg by mouth at bedtime.      . TRAMADOL HCL 50 MG PO TABS Oral Take 50 mg by mouth every 8 (eight) hours as needed. Maximum dose= 8 tablets per day, for pain     . NAPROXEN 375 MG PO TABS Oral Take 1 tablet (375 mg total) by mouth 2 (two) times daily. 30 tablet 0    BP 152/69  Pulse 67  Temp 97.4 F (36.3 C) (Oral)  Resp 20  SpO2 97%  Physical Exam  Constitutional: She appears well-developed and well-nourished.  HENT:  Head: Normocephalic.  Eyes: Pupils are equal, round, and reactive to light.  Neck: Normal range of motion.  Cardiovascular: Normal rate.   Pulmonary/Chest: Effort normal.  Musculoskeletal: Normal range of motion.       Pain in L shoulder with movement as well as L hip but has full ROM without decrease pulse or sensatation   Neurological: She is alert.  Skin: Skin is warm.    ED Course  Procedures (including critical care time)  Labs Reviewed - No data to display Dg Hip 1 View Left  11/05/2011  *RADIOLOGY REPORT*  Clinical Data: Left hip pain  extending into the SI joint.  LEFT HIP - 1 VIEW:  Comparison: 12/31/2006  Findings: Single AP view of the hip is obtained.  There are degenerative changes with near complete loss of joint space, sclerosis, and hypertrophic changes present.  Degenerative changes have progressed since the previous study.  No evidence of acute fracture or subluxation on single view.  No focal bone lesion or bone destruction appreciated.  IMPRESSION: Progressive degenerative changes in the right hip.  Original Report Authenticated By: Marlon Pel, M.D.   Dg Shoulder Left  11/04/2011  *RADIOLOGY REPORT*  Clinical Data: Worsening shoulder pain.  LEFT SHOULDER - 2+ VIEW  Comparison: 12/31/2006  Findings: No evidence of fracture or dislocation.  Spurring is again seen along the greater tuberosity at the greater cuff insertion and acromioclavicular joint. There is increased size of the spurring along the undersurface of acromion and proximal  ossification within the rotator cuff tendon.  Rotator cuff calcific tendonitis cannot be excluded.  These are also predisposing factors for shoulder impingement syndrome.  IMPRESSION:  1.  No acute findings. 2.  Predisposing factors for shoulder impingement syndrome, as described above.  Calcific tendonitis of rotator cuff cannot be excluded.  Consider shoulder MRI for further evaluation.  Original Report Authenticated By: Danae Orleans, M.D.     1. Osteoarthritis       MDM   After further examination of the patient.  She has discomfort in the knee joints, knees, ankles, both hips shoulders       Arman Filter, NP 11/05/11 0541  Arman Filter, NP 12/03/11 4098

## 2011-11-05 NOTE — ED Provider Notes (Signed)
Medical screening examination/treatment/procedure(s) were conducted as a shared visit with non-physician practitioner(s) and myself.  I personally evaluated the patient during the encounter   [name], MD 11/05/11 1104

## 2011-11-05 NOTE — ED Provider Notes (Signed)
Medical screening examination/treatment/procedure(s) were performed by non-physician practitioner and as supervising physician I was immediately available for consultation/collaboration.  Rmani Kapusta K Allanna Bresee-Rasch, MD 11/05/11 0612 

## 2011-12-03 NOTE — ED Provider Notes (Signed)
Medical screening examination/treatment/procedure(s) were performed by non-physician practitioner and as supervising physician I was immediately available for consultation/collaboration.  Jasmine Awe, MD 12/03/11 [phone]

## 2012-02-22 ENCOUNTER — Other Ambulatory Visit: Payer: Self-pay | Admitting: Internal Medicine

## 2012-02-22 DIAGNOSIS — Z1231 Encounter for screening mammogram for malignant neoplasm of breast: Secondary | ICD-10-CM

## 2012-04-03 ENCOUNTER — Ambulatory Visit
Admission: RE | Admit: 2012-04-03 | Discharge: 2012-04-03 | Disposition: A | Discharge status: Home / Self Care | Payer: Medicare Other | Source: Ambulatory Visit | Attending: Internal Medicine | Admitting: Internal Medicine

## 2012-04-03 DIAGNOSIS — Z1231 Encounter for screening mammogram for malignant neoplasm of breast: Secondary | ICD-10-CM

## 2012-09-24 ENCOUNTER — Emergency Department (HOSPITAL_COMMUNITY)
Admission: EM | Admit: 2012-09-24 | Discharge: 2012-09-25 | Disposition: A | Discharge status: Home / Self Care | Payer: Medicare Other | Attending: Emergency Medicine | Admitting: Emergency Medicine

## 2012-09-24 ENCOUNTER — Encounter (HOSPITAL_COMMUNITY): Payer: Self-pay | Admitting: Emergency Medicine

## 2012-09-24 ENCOUNTER — Emergency Department (HOSPITAL_COMMUNITY): Payer: Medicare Other

## 2012-09-24 ENCOUNTER — Other Ambulatory Visit: Payer: Self-pay

## 2012-09-24 DIAGNOSIS — M129 Arthropathy, unspecified: Secondary | ICD-10-CM | POA: Insufficient documentation

## 2012-09-24 DIAGNOSIS — E7800 Pure hypercholesterolemia, unspecified: Secondary | ICD-10-CM | POA: Insufficient documentation

## 2012-09-24 DIAGNOSIS — IMO0002 Reserved for concepts with insufficient information to code with codable children: Secondary | ICD-10-CM | POA: Insufficient documentation

## 2012-09-24 DIAGNOSIS — Z79899 Other long term (current) drug therapy: Secondary | ICD-10-CM | POA: Insufficient documentation

## 2012-09-24 DIAGNOSIS — M1710 Unilateral primary osteoarthritis, unspecified knee: Secondary | ICD-10-CM | POA: Insufficient documentation

## 2012-09-24 DIAGNOSIS — Z7982 Long term (current) use of aspirin: Secondary | ICD-10-CM | POA: Insufficient documentation

## 2012-09-24 DIAGNOSIS — I10 Essential (primary) hypertension: Secondary | ICD-10-CM | POA: Insufficient documentation

## 2012-09-24 DIAGNOSIS — R0789 Other chest pain: Secondary | ICD-10-CM

## 2012-09-24 LAB — POCT I-STAT TROPONIN I: Troponin i, poc: 0.01 ng/mL (ref 0.00–0.08)

## 2012-09-24 NOTE — ED Provider Notes (Signed)
History    CSN: [id number] Arrival date & time 09/24/12  2127  First MD Initiated Contact with Patient 09/24/12 2213     Chief Complaint  Patient presents with  . Chest Pain   (Consider location/radiation/quality/duration/timing/severity/associated sxs/prior Treatment) HPI Comments: Pt reports she has had CP's intermittently for months.  She attended a funeral this afternoon, did some walking with her cane like usual and had no CP or tightness.  Shortly after getting home, she developed about 20 minutes of left side anterior CP that was non radiating.  No dizziness, SOB, nausea, diaphoresis.  Pain is resolved at present and hasn't re-occurred.  No prior h/o CHF, CAD, arrythmia.  She never smoked, has a h/o hyperlipidemia and HTN, on meds.  She has had a stress test many years ago and it was ok.  No fever, cough, heart burn, abd pain.  No back pain.  No syncope.    Patient is a 79 y.o. female presenting with chest pain. The history is provided by the patient and a relative.  Chest Pain Associated symptoms: no abdominal pain, no back pain, no cough, no diaphoresis, no dizziness, no fever, no nausea, no palpitations, no shortness of breath and not vomiting    Past Medical History  Diagnosis Date  . Hypertension   . Hypercholesterolemia   . Arthritis   . Osteoarthritis of right knee   . Degenerative disk disease    Past Surgical History  Procedure Laterality Date  . Abdominal hysterectomy    . Total hip arthroplasty     History reviewed. No pertinent family history. History  Substance Use Topics  . Smoking status: Never Smoker   . Smokeless tobacco: Not on file  . Alcohol Use: No   OB History   Grav Para Term Preterm Abortions TAB SAB Ect Mult Living                 Review of Systems  Constitutional: Negative for fever, chills and diaphoresis.  HENT: Negative for congestion.   Respiratory: Negative for cough and shortness of breath.   Cardiovascular: Positive for chest  pain. Negative for palpitations.  Gastrointestinal: Negative for nausea, vomiting and abdominal pain.  Musculoskeletal: Negative for back pain.  Neurological: Negative for dizziness and light-headedness.  All other systems reviewed and are negative.    Allergies  Review of patient's allergies indicates no known allergies.  Home Medications   Current Outpatient Rx  Name  Route  Sig  Dispense  Refill  . aspirin EC 81 MG tablet   Oral   Take 81 mg by mouth every morning.          . Cholecalciferol (VITAMIN D) 2000 UNITS tablet   Oral   Take 2,000 Units by mouth daily.           Marland Kitchen diltiazem (CARDIZEM) 60 MG tablet   Oral   Take 60 mg by mouth 2 (two) times daily.           . fesoterodine (TOVIAZ) 4 MG TB24   Oral   Take 4 mg by mouth every morning.         . hydrochlorothiazide (HYDRODIURIL) 25 MG tablet   Oral   Take 25 mg by mouth daily.           . Menthol-Methyl Salicylate (MUSCLE RUB) 10-15 % CREA   Topical   Apply 1 application topically as needed (for sore muscle).         . Multiple Vitamin (  MULTIVITAMIN WITH MINERALS) TABS   Oral   Take 1 tablet by mouth daily.         Bertram Gala Glycol-Propyl Glycol (SYSTANE OP)   Both Eyes   Place 1 drop into both eyes every morning.         . simvastatin (ZOCOR) 40 MG tablet   Oral   Take 40 mg by mouth at bedtime.           . traMADol (ULTRAM) 50 MG tablet   Oral   Take 50 mg by mouth every 8 (eight) hours as needed for pain. Maximum dose= 8 tablets per day, for pain          BP 150/64  Pulse 74  Temp(Src) 98.5 F (36.9 C) (Oral)  Ht 5\' 7"  (1.702 m)  Wt 165 lb (74.844 kg)  BMI 25.84 kg/m2  SpO2 95% Physical Exam  Nursing note and vitals reviewed. Constitutional: She is oriented to person, place, and time. She appears well-developed and well-nourished. No distress.  HENT:  Head: Normocephalic and atraumatic.  Eyes: Conjunctivae and EOM are normal.  Neck: Neck supple.  Cardiovascular:  Normal rate, regular rhythm and intact distal pulses.   No murmur heard. Pulmonary/Chest: Effort normal. No respiratory distress. She has no wheezes. She has no rales.  Abdominal: Soft. She exhibits no distension. There is no tenderness. There is no rebound.  Musculoskeletal: She exhibits no edema.  Neurological: She is alert and oriented to person, place, and time. She exhibits normal muscle tone. Coordination normal.  Skin: Skin is warm and dry. She is not diaphoretic.  Psychiatric: She has a normal mood and affect.    ED Course  Procedures (including critical care time) Labs Reviewed  POCT I-STAT TROPONIN I   Dg Chest 2 View  09/24/2012   *RADIOLOGY REPORT*  Clinical Data: Left-sided chest pain.  CHEST - 2 VIEW  Comparison: None.  Findings:  Cardiopericardial silhouette within normal limits. Mediastinal contours normal. Trachea midline.  No airspace disease or effusion.  Bosselation of the right hemidiaphragm.  Thoracic spondylosis noted on the lateral view.  Partially radiopaque monitoring leads project over the chest.  IMPRESSION: No acute cardiopulmonary disease.   Original Report Authenticated By: Andreas Newport, M.D.   1. Chest pain, atypical     RA sat is 95% and I interpret to be adequate  ECG at time 21:46 shows SR at rate 68, normal intervals, non specific ST changes, not significantly changed compared to ECG on 11/04/11.    11:32 PM Pt continues to feel well, no CP, SOB.  Pt is offered inpt observation given age, slight cardiac risk factors, but pt and family are comfortable going home, will return if worse, will call her PCP and arrange for close outpt follow up.  MDM  Pt with 20 minutes of CP, occurred while laying down, did not occur with physical activity earlier in the day, no associated symptoms such as sweats or SOB to suggest ACS.  No other symptoms to suggest PE, pericarditis, URI.  Will get CXR, ECG, 1 troponin since pain began in late afternoon which was about 5  hours ago.  If neg, pt is comfortable going home and following up with PCP.  Pt knows to return immediately for any worsening symptoms.    Gavin Pound. Scheryl Sanborn, MD 09/24/12 2332

## 2012-09-24 NOTE — Discharge Instructions (Signed)
Chest Pain (Nonspecific) °It is often hard to give a specific diagnosis for the cause of chest pain. There is always a chance that your pain could be related to something serious, such as a heart attack or a blood clot in the lungs. You need to follow up with your caregiver for further evaluation. °CAUSES  °· Heartburn. °· Pneumonia or bronchitis. °· Anxiety or stress. °· Inflammation around your heart (pericarditis) or lung (pleuritis or pleurisy). °· A blood clot in the lung. °· A collapsed lung (pneumothorax). It can develop suddenly on its own (spontaneous pneumothorax) or from injury (trauma) to the chest. °· Shingles infection (herpes zoster virus). °The chest wall is composed of bones, muscles, and cartilage. Any of these can be the source of the pain. °· The bones can be bruised by injury. °· The muscles or cartilage can be strained by coughing or overwork. °· The cartilage can be affected by inflammation and become sore (costochondritis). °DIAGNOSIS  °Lab tests or other studies, such as X-rays, electrocardiography, stress testing, or cardiac imaging, may be needed to find the cause of your pain.  °TREATMENT  °· Treatment depends on what may be causing your chest pain. Treatment may include: °· Acid blockers for heartburn. °· Anti-inflammatory medicine. °· Pain medicine for inflammatory conditions. °· Antibiotics if an infection is present. °· You may be advised to change lifestyle habits. This includes stopping smoking and avoiding alcohol, caffeine, and chocolate. °· You may be advised to keep your head raised (elevated) when sleeping. This reduces the chance of acid going backward from your stomach into your esophagus. °· Most of the time, nonspecific chest pain will improve within 2 to 3 days with rest and mild pain medicine. °HOME CARE INSTRUCTIONS  °· If antibiotics were prescribed, take your antibiotics as directed. Finish them even if you start to feel better. °· For the next few days, avoid physical  activities that bring on chest pain. Continue physical activities as directed. °· Do not smoke. °· Avoid drinking alcohol. °· Only take over-the-counter or prescription medicine for pain, discomfort, or fever as directed by your caregiver. °· Follow your caregiver's suggestions for further testing if your chest pain does not go away. °· Keep any follow-up appointments you made. If you do not go to an appointment, you could develop lasting (chronic) problems with pain. If there is any problem keeping an appointment, you must call to reschedule. °SEEK MEDICAL CARE IF:  °· You think you are having problems from the medicine you are taking. Read your medicine instructions carefully. °· Your chest pain does not go away, even after treatment. °· You develop a rash with blisters on your chest. °SEEK IMMEDIATE MEDICAL CARE IF:  °· You have increased chest pain or pain that spreads to your arm, neck, jaw, back, or abdomen. °· You develop shortness of breath, an increasing cough, or you are coughing up blood. °· You have severe back or abdominal pain, feel nauseous, or vomit. °· You develop severe weakness, fainting, or chills. °· You have a fever. °THIS IS AN EMERGENCY. Do not wait to see if the pain will go away. Get medical help at once. Call your local emergency services (911 in [place] Do not drive yourself to the hospital. °MAKE SURE YOU:  °· Understand these instructions. °· Will watch your condition. °· Will get help right away if you are not doing well or get worse. °Document Released: 12/23/2004 Document Revised: 06/07/2011 Document Reviewed: 10/19/2007 °[employer]® Patient Information ©2014 [employer]   [employer]. ° °

## 2012-09-24 NOTE — ED Notes (Signed)
Arrived by EMS patient is alert and oriented denies pain currently, patient has been having chest pain off and on for the past 6 months and tonight at 7:30pm she went and got her son and told him that she needed to go to the hospital because the pain felt worse

## 2012-10-06 ENCOUNTER — Other Ambulatory Visit (HOSPITAL_COMMUNITY): Payer: Medicare Other

## 2012-10-10 ENCOUNTER — Ambulatory Visit (HOSPITAL_COMMUNITY): Payer: Medicare Other | Attending: Cardiovascular Disease | Admitting: Radiology

## 2012-10-10 ENCOUNTER — Other Ambulatory Visit (HOSPITAL_COMMUNITY): Payer: Self-pay | Admitting: Internal Medicine

## 2012-10-10 DIAGNOSIS — I10 Essential (primary) hypertension: Secondary | ICD-10-CM | POA: Insufficient documentation

## 2012-10-10 DIAGNOSIS — R072 Precordial pain: Secondary | ICD-10-CM

## 2012-10-10 DIAGNOSIS — R079 Chest pain, unspecified: Secondary | ICD-10-CM | POA: Insufficient documentation

## 2012-10-10 DIAGNOSIS — I059 Rheumatic mitral valve disease, unspecified: Secondary | ICD-10-CM | POA: Insufficient documentation

## 2012-10-10 DIAGNOSIS — E7800 Pure hypercholesterolemia, unspecified: Secondary | ICD-10-CM | POA: Insufficient documentation

## 2012-10-10 NOTE — Progress Notes (Signed)
Echocardiogram performed.  

## 2013-12-18 ENCOUNTER — Other Ambulatory Visit: Payer: Self-pay | Admitting: Internal Medicine

## 2013-12-18 DIAGNOSIS — Z1231 Encounter for screening mammogram for malignant neoplasm of breast: Secondary | ICD-10-CM

## 2013-12-26 ENCOUNTER — Ambulatory Visit: Payer: Medicare Other

## 2013-12-28 ENCOUNTER — Ambulatory Visit
Admission: RE | Admit: 2013-12-28 | Discharge: 2013-12-28 | Disposition: A | Discharge status: Home / Self Care | Payer: Medicare Other | Source: Ambulatory Visit | Attending: Internal Medicine | Admitting: Internal Medicine

## 2013-12-28 DIAGNOSIS — Z1231 Encounter for screening mammogram for malignant neoplasm of breast: Secondary | ICD-10-CM

## 2014-05-12 ENCOUNTER — Emergency Department (HOSPITAL_COMMUNITY)
Admission: EM | Admit: 2014-05-12 | Discharge: 2014-05-12 | Disposition: A | Discharge status: Home / Self Care | Payer: Medicare Other | Attending: Emergency Medicine | Admitting: Emergency Medicine

## 2014-05-12 ENCOUNTER — Encounter (HOSPITAL_COMMUNITY): Payer: Self-pay | Admitting: Emergency Medicine

## 2014-05-12 ENCOUNTER — Emergency Department (HOSPITAL_COMMUNITY): Payer: Medicare Other

## 2014-05-12 DIAGNOSIS — I6529 Occlusion and stenosis of unspecified carotid artery: Secondary | ICD-10-CM | POA: Diagnosis not present

## 2014-05-12 DIAGNOSIS — I10 Essential (primary) hypertension: Secondary | ICD-10-CM | POA: Diagnosis not present

## 2014-05-12 DIAGNOSIS — M179 Osteoarthritis of knee, unspecified: Secondary | ICD-10-CM | POA: Diagnosis not present

## 2014-05-12 DIAGNOSIS — Z79899 Other long term (current) drug therapy: Secondary | ICD-10-CM | POA: Insufficient documentation

## 2014-05-12 DIAGNOSIS — R51 Headache: Secondary | ICD-10-CM | POA: Diagnosis present

## 2014-05-12 DIAGNOSIS — M1990 Unspecified osteoarthritis, unspecified site: Secondary | ICD-10-CM | POA: Insufficient documentation

## 2014-05-12 DIAGNOSIS — G4489 Other headache syndrome: Secondary | ICD-10-CM | POA: Insufficient documentation

## 2014-05-12 DIAGNOSIS — E780 Pure hypercholesterolemia: Secondary | ICD-10-CM | POA: Insufficient documentation

## 2014-05-12 DIAGNOSIS — Z7982 Long term (current) use of aspirin: Secondary | ICD-10-CM | POA: Insufficient documentation

## 2014-05-12 DIAGNOSIS — R531 Weakness: Secondary | ICD-10-CM

## 2014-05-12 DIAGNOSIS — I6523 Occlusion and stenosis of bilateral carotid arteries: Secondary | ICD-10-CM

## 2014-05-12 LAB — I-STAT CHEM 8, ED
BUN: 32 mg/dL — ABNORMAL HIGH (ref 6–23)
CALCIUM ION: 1.24 mmol/L (ref 1.13–1.30)
CHLORIDE: 103 mmol/L (ref 96–112)
Creatinine, Ser: 1.2 mg/dL — ABNORMAL HIGH (ref 0.50–1.10)
GLUCOSE: 118 mg/dL — AB (ref 70–99)
HEMATOCRIT: 41 % (ref 36.0–46.0)
Hemoglobin: 13.9 g/dL (ref 12.0–15.0)
Potassium: 4.1 mmol/L (ref 3.5–5.1)
SODIUM: 139 mmol/L (ref 135–145)
TCO2: 23 mmol/L (ref 0–100)

## 2014-05-12 NOTE — ED Provider Notes (Signed)
CSN: [id number]     Arrival date & time 05/12/14  1258 History   First MD Initiated Contact with Patient 05/12/14 1315     Chief Complaint  Patient presents with  . Headache   HPI Pt has been having pain in the back of her head for the last week.  The pain has been off and on.  When she turns her head certain ways it bothers her.  She thought it may have been a crick in her neck.  The pain seems to be getting worse when it occurs.  No fevers.  No vomiting.  No numbness.  She is having some trouble with weakness in her arm.  That has been going on for a long time.  (maybe for months) Past Medical History  Diagnosis Date  . Hypertension   . Hypercholesterolemia   . Arthritis   . Osteoarthritis of right knee   . Degenerative disk disease    Past Surgical History  Procedure Laterality Date  . Abdominal hysterectomy    . Total hip arthroplasty     No family history on file. History  Substance Use Topics  . Smoking status: Never Smoker   . Smokeless tobacco: Not on file  . Alcohol Use: No   OB History    No data available     Review of Systems  All other systems reviewed and are negative.     Allergies  Review of patient's allergies indicates no known allergies.  Home Medications   Prior to Admission medications   Medication Sig Start Date End Date Taking? Authorizing Provider  aspirin EC 81 MG tablet Take 81 mg by mouth every morning.    Yes Historical Provider, MD  Cholecalciferol (VITAMIN D) 2000 UNITS tablet Take 2,000 Units by mouth daily.     Yes Historical Provider, MD  diltiazem (CARDIZEM) 60 MG tablet Take 60 mg by mouth 2 (two) times daily.     Yes Historical Provider, MD  enalapril (VASOTEC) 10 MG tablet Take 10 mg by mouth daily. 04/08/14  Yes Historical Provider, MD  fesoterodine (TOVIAZ) 4 MG TB24 Take 4 mg by mouth every morning.   Yes Historical Provider, MD  hydrochlorothiazide (HYDRODIURIL) 25 MG tablet Take 25 mg by mouth daily.     Yes Historical  Provider, MD  Menthol-Methyl Salicylate (MUSCLE RUB) 10-15 % CREA Apply 1 application topically as needed (for sore muscle).   Yes Historical Provider, MD  Multiple Vitamin (MULTIVITAMIN WITH MINERALS) TABS Take 1 tablet by mouth daily.   Yes Historical Provider, MD  Polyethyl Glycol-Propyl Glycol (SYSTANE OP) Place 1 drop into both eyes every morning.   Yes Historical Provider, MD  simvastatin (ZOCOR) 40 MG tablet Take 40 mg by mouth at bedtime.     Yes Historical Provider, MD  solifenacin (VESICARE) 5 MG tablet Take 5 mg by mouth daily.   Yes Historical Provider, MD  traMADol (ULTRAM) 50 MG tablet Take 50 mg by mouth every 8 (eight) hours as needed for pain. Maximum dose= 8 tablets per day, for pain   Yes Historical Provider, MD   BP 130/62 mmHg  Pulse 60  Temp(Src) 97.9 F (36.6 C) (Oral)  Resp 15  Ht 5\' 7"  (1.702 m)  Wt 155 lb (70.308 kg)  BMI 24.27 kg/m2  SpO2 100% Physical Exam  Constitutional: She is oriented to person, place, and time. She appears well-developed and well-nourished. No distress.  HENT:  Head: Normocephalic and atraumatic.  Right Ear: External ear normal.  Left Ear: External ear normal.  Mouth/Throat: Oropharynx is clear and moist.  Eyes: Conjunctivae are normal. Right eye exhibits no discharge. Left eye exhibits no discharge. No scleral icterus.  Neck: Neck supple. No tracheal deviation present.  Cardiovascular: Normal rate, regular rhythm and intact distal pulses.   Pulmonary/Chest: Effort normal and breath sounds normal. No stridor. No respiratory distress. She has no wheezes. She has no rales.  Abdominal: Soft. Bowel sounds are normal. She exhibits no distension. There is no tenderness. There is no rebound and no guarding.  Musculoskeletal: She exhibits no edema or tenderness.  Neurological: She is alert and oriented to person, place, and time. She has normal strength. No cranial nerve deficit (no facial droop, extraocular movements intact, no slurred speech)  or sensory deficit. She exhibits normal muscle tone. She displays no seizure activity. Coordination normal.  No pronator drift bilateral upper extrem, able to hold both legs off bed for 5 seconds, sensation intact in all extremities, no visual field cuts, no left or right sided neglect, normal finger-nose exam bilaterally, no nystagmus noted   Skin: Skin is warm and dry. No rash noted.  Psychiatric: She has a normal mood and affect.  Nursing note and vitals reviewed.   ED Course  Procedures (including critical care time) Labs Review Labs Reviewed  I-STAT CHEM 8, ED - Abnormal; Notable for the following:    BUN 32 (*)    Creatinine, Ser 1.20 (*)    Glucose, Bld 118 (*)    All other components within normal limits    Imaging Review Ct Head Wo Contrast  05/12/2014   CLINICAL DATA:  One week history of posterior headache and right upper extremity weakness  EXAM: CT HEAD WITHOUT CONTRAST  CT CERVICAL SPINE WITHOUT CONTRAST  TECHNIQUE: Multidetector CT imaging of the head and cervical spine was performed following the standard protocol without intravenous contrast. Multiplanar CT image reconstructions of the cervical spine were also generated.  COMPARISON:  None.  In the  FINDINGS: CT HEAD FINDINGS  There is mild diffuse atrophy. There is no intracranial mass, hemorrhage, extra-axial fluid collection, or midline shift. There is mild small vessel disease in the centra semiovale bilaterally. Elsewhere, gray-white compartments appear normal. No acute infarct apparent. The bony calvarium appears intact. The mastoid air cells are clear. There is debris in each external auditory canal.  CT CERVICAL SPINE FINDINGS  There is no fracture or spondylolisthesis. Prevertebral soft tissues and predental space regions are normal.  There is moderate disc space narrowing at C3-4 and C6-7.  At C2-3, there is moderate facet hypertrophy bilaterally. There is exit foraminal narrowing due to bony hypertrophy on the right  at this level. No disc extrusion or stenosis is appreciable.  At C3-4, there is moderate facet hypertrophy bilaterally. There is exit foraminal narrowing bilaterally at this level due to bony hypertrophy. No disc extrusion or stenosis.  At C4-5, there is moderately severe facet osteoarthritic change bilaterally. There is exit foraminal narrowing bilaterally due to bony hypertrophy. No disc extrusion or stenosis.  At C5-6, there is extensive facet osteoarthritic change bilaterally. There is exit foraminal narrowing bilaterally at this level due to bony hypertrophy. No disc extrusion or stenosis.  At C6-7, there is severe facet osteoarthritic change bilaterally, slightly more severe on the left than on the right. There is exit foraminal narrowing bilaterally due to bony overgrowth. No disc extrusion or stenosis.  At C7-T1, there is moderate facet hypertrophy bilaterally. No nerve root edema or effacement. No disc  extrusion or stenosis.  There is carotid artery calcification bilaterally which appears fairly extensive at the respective carotid bifurcation levels bilaterally.  IMPRESSION: CT head: Mild atrophy with mild periventricular small vessel disease. No intracranial mass, hemorrhage, or acute appearing infarct. Probable cerumen in each external auditory canal.  CT cervical spine: Multifocal osteoarthritic change. No fracture or spondylolisthesis. Focal carotid artery calcification bilaterally which may be causing hemodynamically significant obstruction near the carotid bifurcations bilaterally.   Electronically Signed   By: Bretta Bang III M.D.   On: 05/12/2014 15:04   Ct Cervical Spine Wo Contrast  05/12/2014   CLINICAL DATA:  One week history of posterior headache and right upper extremity weakness  EXAM: CT HEAD WITHOUT CONTRAST  CT CERVICAL SPINE WITHOUT CONTRAST  TECHNIQUE: Multidetector CT imaging of the head and cervical spine was performed following the standard protocol without intravenous  contrast. Multiplanar CT image reconstructions of the cervical spine were also generated.  COMPARISON:  None.  In the  FINDINGS: CT HEAD FINDINGS  There is mild diffuse atrophy. There is no intracranial mass, hemorrhage, extra-axial fluid collection, or midline shift. There is mild small vessel disease in the centra semiovale bilaterally. Elsewhere, gray-white compartments appear normal. No acute infarct apparent. The bony calvarium appears intact. The mastoid air cells are clear. There is debris in each external auditory canal.  CT CERVICAL SPINE FINDINGS  There is no fracture or spondylolisthesis. Prevertebral soft tissues and predental space regions are normal.  There is moderate disc space narrowing at C3-4 and C6-7.  At C2-3, there is moderate facet hypertrophy bilaterally. There is exit foraminal narrowing due to bony hypertrophy on the right at this level. No disc extrusion or stenosis is appreciable.  At C3-4, there is moderate facet hypertrophy bilaterally. There is exit foraminal narrowing bilaterally at this level due to bony hypertrophy. No disc extrusion or stenosis.  At C4-5, there is moderately severe facet osteoarthritic change bilaterally. There is exit foraminal narrowing bilaterally due to bony hypertrophy. No disc extrusion or stenosis.  At C5-6, there is extensive facet osteoarthritic change bilaterally. There is exit foraminal narrowing bilaterally at this level due to bony hypertrophy. No disc extrusion or stenosis.  At C6-7, there is severe facet osteoarthritic change bilaterally, slightly more severe on the left than on the right. There is exit foraminal narrowing bilaterally due to bony overgrowth. No disc extrusion or stenosis.  At C7-T1, there is moderate facet hypertrophy bilaterally. No nerve root edema or effacement. No disc extrusion or stenosis.  There is carotid artery calcification bilaterally which appears fairly extensive at the respective carotid bifurcation levels bilaterally.   IMPRESSION: CT head: Mild atrophy with mild periventricular small vessel disease. No intracranial mass, hemorrhage, or acute appearing infarct. Probable cerumen in each external auditory canal.  CT cervical spine: Multifocal osteoarthritic change. No fracture or spondylolisthesis. Focal carotid artery calcification bilaterally which may be causing hemodynamically significant obstruction near the carotid bifurcations bilaterally.   Electronically Signed   By: Bretta Bang III M.D.   On: 05/12/2014 15:04     MDM   Final diagnoses:  Other headache syndrome  Carotid artery calcification, bilateral    No deficits noted on exam.  Doubt acute stroke.  Pt may be having some radicular type symptoms with the pain she has noticed.  Ultram has helped.  Continue that for pain  Discussed the carotid calcifications.  Outpatient follow up with carotid dopplers discussed    Linwood Dibbles, MD 05/12/14 1539

## 2014-05-12 NOTE — ED Notes (Signed)
Patient transported to CT 

## 2014-05-12 NOTE — ED Notes (Signed)
Pt c/o right sided posterior head pain x 1 week. Pt denies n/v or dizziness.

## 2014-05-12 NOTE — Discharge Instructions (Signed)
Atherosclerosis Atherosclerosis, or hardening of the arteries, is the buildup of plaque within the major arteries in the body. Plaque is made up of fats (lipids), cholesterol, calcium, and fibrous tissue. Plaque can narrow or block blood flow within an artery. Plaque can break off and cause damage to the affected organ. Plaque can also "rupture." When plaque ruptures within an artery, a clot can form, causing a sudden (acute) blockage of the artery. Untreated atherosclerosis can cause serious health problems or death.  RISK FACTORS  High cholesterol levels.  Smoking.  Obesity.  Lack of activity or exercise.  Eating a diet high in saturated fat.  Family history.  Diabetes. SIGNS AND SYMPTOMS  Symptoms of atherosclerosis can occur when blood flow to an artery is slowed or blocked. Severity and onset of symptoms depends on how extensive the narrowing or blockage is. A sudden plaque rupture can bring immediate, life-threatening symptoms. Atherosclerosis can affect different arteries in the body, for example:  Coronary arteries. The coronary arteries supply the heart with blood. When the coronary arteries are narrowed or blocked from atherosclerosis, this is known as coronary artery disease (CAD). CAD can cause a heart attack. Common heart attack symptoms include:  Chest pain or pain that radiates to the neck, arm, jaw, or in the upper, middle back (mid-scapular pain).  Shortness of breath without cause.  Profuse sweating while at rest.  Irregular heartbeats.  Nausea or gastrointestinal upset.  Carotid arteries. The carotid arteries supply the brain with blood. They are located on each side of your neck. When blood flow to these arteries is slowed or blocked, a transient ischemic attack (TIA) or stroke can occur. A TIA is considered a "mini-stroke" or "warning stroke." TIA symptoms are the same as stroke symptoms, but they are temporary and last less than 24 hours. A stroke can cause  permanent damage or death. Common TIA and stroke symptoms include:  Sudden numbness or weakness to one side of your body, such as the face, arm, or leg.  Sudden confusion or trouble speaking or understanding.  Sudden trouble seeing out of one or both eyes.  Sudden trouble walking, loss of balance, or dizziness.  Sudden, severe headache with no known cause.  Arteries in the legs. When arteries in the lower legs become narrowed or blocked, this is known as peripheral vascular disease (PVD). PVD can cause a symptom called claudication. Claudication is pain or a burning feeling in your legs when walking or exercising and usually goes away with rest. Very severe PVD can cause pain in your legs while at rest.  Renal arteries. The renal arteries supply the kidneys with blood. Blockage of the renal arteries can cause a decline in kidney function or high blood pressure (hypertension).  Gastrointestinal arteries (mesenteric circulation). Abdominal pain may occur after eating. DIAGNOSIS  Your health care provider may perform the following tests to diagnose atherosclerosis:  Blood tests.  Stress test.  Echocardiogram.  Nuclear scan.  Ankle/brachial index.  Ultrasonography.  Computed tomography (CT) scan.  Angiography. TREATMENT  Atherosclerosis treatment includes the following:  Lifestyle changes such as:  Quitting smoking. Your health care provider can help you with smoking cessation.  Eating a diet low in saturated fat. A registered dietitian can educate you on healthy food options, such as helping you understand the difference between good fat and bad fat.  Following an exercise program approved by your health care provider.  Maintaining a healthy weight. Lose weight as approved by your health care provider.  Have  your cholesterol levels checked as directed by your health care provider.  Medicines. Cholesterol medicines can help slow or stop the progression of  atherosclerosis.  Different procedural or surgical interventions to treat atherosclerosis include:  Balloon angioplasty. The technical name for balloon angioplasty is percutaneous transluminal angioplasty (PTA). In this procedure, a catheter with a small balloon at the tip is inserted through the blocked or narrowed artery. The balloon is then inflated. When the balloon is inflated, the fatty plaque is compressed against the artery wall, allowing better blood flow within the artery.  Balloon angioplasty and stenting. In this procedure, balloon angioplasty is combined with a stenting procedure. A stent is a small, metal mesh tube that keeps the artery open. After the artery is opened up by the balloon technique, the stent is then deployed. The stent is permanent.  Open heart surgery or bypass surgery. To perform this type of surgery, a healthy vessel is first "harvested" from either the leg or arm. The harvested vessel is then used to "bypass" the blocked atherosclerotic vessel so new blood flow can be established.  Atherectomy. Atherectomy is a procedure that uses a catheter with a sharp blade to remove plaque from an artery. A chamber in the catheter collects the plaque.  Endarterectomy. An endarterectomy is a surgical procedure where a surgeon removes plaque from an artery.  Amputation. When blockages in the lower legs are very severe and circulation cannot be restored, amputation may be required. SEEK IMMEDIATE MEDICAL CARE IF:  You are having heart attack symptoms, such as:  Chest pain or pain that radiates to the neck, arm, jaw, or in the upper, middle back (mid-scapular pain).  Shortness of breath without cause.  Profuse sweating while at rest.  Irregular heartbeats.  Nausea or gastrointestinal upset.  You are having stroke symptoms, such as sudden:  Numbness or weakness to one side of your body, such as the face, arm, or leg.  Confusion or trouble speaking or  understanding.  Trouble seeing out of one or both eyes.  Trouble walking, loss of balance, or dizziness.  Severe headache with no known cause.  Your hands or feet are bluish, cold, or you have pain in them.  You have bad abdominal pain after eating. Symptoms of heart attack or stroke may represent a serious problem that is an emergency. Do not wait to see if the symptoms will go away. Get medical help right away. Call your local emergency services (911 in the [place] Do not drive yourself to the hospital. Document Released: 06/05/2003 Document Revised: 07/30/2013 Document Reviewed: 05/18/2011 [facility] Patient Information 2015 [place], [place]. This information is not intended to replace advice given to you by your health care provider. Make sure you discuss any questions you have with your health care provider. Degenerative Disk Disease Degenerative disk disease is a condition caused by the changes that occur in the cushions of the backbone (spinal disks) as you grow older. Spinal disks are soft and compressible disks located between the bones of the spine (vertebrae). They act like shock absorbers. Degenerative disk disease can affect the whole spine. However, the neck and lower back are most commonly affected. Many changes can occur in the spinal disks with aging, such as:  The spinal disks may dry and shrink.  Small tears may occur in the tough, outer covering of the disk (annulus).  The disk space may become smaller due to loss of water.  Abnormal growths in the bone (spurs) may occur. This can put pressure  on the nerve roots exiting the spinal canal, causing pain.  The spinal canal may become narrowed. CAUSES  Degenerative disk disease is a condition caused by the changes that occur in the spinal disks with aging. The exact cause is not known, but there is a genetic basis for many patients. Degenerative changes can occur due to loss of fluid in the disk. This makes the disk thinner and  reduces the space between the backbones. Small cracks can develop in the outer layer of the disk. This can lead to the breakdown of the disk. You are more likely to get degenerative disk disease if you are overweight. Smoking cigarettes and doing heavy work such as weightlifting can also increase your risk of this condition. Degenerative changes can start after a sudden injury. Growth of bone spurs can compress the nerve roots and cause pain.  SYMPTOMS  The symptoms vary from person to person. Some people may have no pain, while others have severe pain. The pain may be so severe that it can limit your activities. The location of the pain depends on the part of your backbone that is affected. You will have neck or arm pain if a disk in the neck area is affected. You will have pain in your back, buttocks, or legs if a disk in the lower back is affected. The pain becomes worse while bending, reaching up, or with twisting movements. The pain may start gradually and then get worse as time passes. It may also start after a major or minor injury. You may feel numbness or tingling in the arms or legs.  DIAGNOSIS  Your caregiver will ask you about your symptoms and about activities or habits that may cause the pain. He or she may also ask about any injuries, diseases, or treatments you have had earlier. Your caregiver will examine you to check for the range of movement that is possible in the affected area, to check for strength in your extremities, and to check for sensation in the areas of the arms and legs supplied by different nerve roots. An X-ray of the spine may be taken. Your caregiver may suggest other imaging tests, such as magnetic resonance imaging (MRI), if needed.  TREATMENT  Treatment includes rest, modifying your activities, and applying ice and heat. Your caregiver may prescribe medicines to reduce your pain and may ask you to do some exercises to strengthen your back. In some cases, you may need  surgery. You and your caregiver will decide on the treatment that is best for you. HOME CARE INSTRUCTIONS   Follow proper lifting and walking techniques as advised by your caregiver.  Maintain good posture.  Exercise regularly as advised.  Perform relaxation exercises.  Change your sitting, standing, and sleeping habits as advised. Change positions frequently.  Lose weight as advised.  Stop smoking if you smoke.  Wear supportive footwear. SEEK MEDICAL CARE IF:  Your pain does not go away within 1 to 4 weeks. SEEK IMMEDIATE MEDICAL CARE IF:   Your pain is severe.  You notice weakness in your arms, hands, or legs.  You begin to lose control of your bladder or bowel movements. MAKE SURE YOU:   Understand these instructions.  Will watch your condition.  Will get help right away if you are not doing well or get worse. Document Released: 01/10/2007 Document Revised: 06/07/2011 Document Reviewed: 07/17/2013 [facility] Patient Information 2015 [place], [place]. This information is not intended to replace advice given to you by your health care  provider. Make sure you discuss any questions you have with your health care provider. ° °

## 2014-10-11 ENCOUNTER — Other Ambulatory Visit: Payer: Self-pay | Admitting: Internal Medicine

## 2014-10-11 DIAGNOSIS — R52 Pain, unspecified: Secondary | ICD-10-CM

## 2014-10-11 DIAGNOSIS — M79604 Pain in right leg: Secondary | ICD-10-CM

## 2014-10-11 DIAGNOSIS — M79605 Pain in left leg: Secondary | ICD-10-CM

## 2014-10-11 DIAGNOSIS — R609 Edema, unspecified: Secondary | ICD-10-CM

## 2014-10-15 ENCOUNTER — Other Ambulatory Visit: Payer: PRIVATE HEALTH INSURANCE

## 2014-10-18 ENCOUNTER — Other Ambulatory Visit: Payer: PRIVATE HEALTH INSURANCE

## 2014-10-28 ENCOUNTER — Other Ambulatory Visit: Payer: PRIVATE HEALTH INSURANCE

## 2014-10-30 ENCOUNTER — Ambulatory Visit
Admission: RE | Admit: 2014-10-30 | Discharge: 2014-10-30 | Disposition: A | Discharge status: Home / Self Care | Payer: Medicaid Other | Source: Ambulatory Visit | Attending: Internal Medicine | Admitting: Internal Medicine

## 2014-10-30 DIAGNOSIS — R609 Edema, unspecified: Secondary | ICD-10-CM

## 2014-10-30 DIAGNOSIS — M79604 Pain in right leg: Secondary | ICD-10-CM

## 2014-10-30 DIAGNOSIS — M79605 Pain in left leg: Secondary | ICD-10-CM

## 2014-10-30 DIAGNOSIS — R52 Pain, unspecified: Secondary | ICD-10-CM

## 2015-04-08 ENCOUNTER — Other Ambulatory Visit: Payer: Self-pay | Admitting: Internal Medicine

## 2015-04-08 DIAGNOSIS — Z1231 Encounter for screening mammogram for malignant neoplasm of breast: Secondary | ICD-10-CM

## 2015-04-08 DIAGNOSIS — E2839 Other primary ovarian failure: Secondary | ICD-10-CM

## 2015-05-07 ENCOUNTER — Ambulatory Visit: Payer: Medicare Other

## 2015-05-07 ENCOUNTER — Other Ambulatory Visit: Payer: Medicare Other

## 2015-05-28 ENCOUNTER — Ambulatory Visit
Admission: RE | Admit: 2015-05-28 | Discharge: 2015-05-28 | Disposition: A | Discharge status: Home / Self Care | Payer: Medicare Other | Source: Ambulatory Visit | Attending: Internal Medicine | Admitting: Internal Medicine

## 2015-05-28 DIAGNOSIS — E2839 Other primary ovarian failure: Secondary | ICD-10-CM

## 2015-05-28 DIAGNOSIS — Z1231 Encounter for screening mammogram for malignant neoplasm of breast: Secondary | ICD-10-CM

## 2017-02-07 ENCOUNTER — Other Ambulatory Visit: Payer: Self-pay | Admitting: Internal Medicine

## 2017-02-07 DIAGNOSIS — Z1231 Encounter for screening mammogram for malignant neoplasm of breast: Secondary | ICD-10-CM

## 2017-02-11 ENCOUNTER — Other Ambulatory Visit: Payer: Self-pay | Admitting: Internal Medicine

## 2017-02-11 DIAGNOSIS — N644 Mastodynia: Secondary | ICD-10-CM

## 2017-02-25 ENCOUNTER — Other Ambulatory Visit: Payer: Medicare Other

## 2017-03-24 ENCOUNTER — Other Ambulatory Visit: Payer: Medicare Other

## 2017-03-31 ENCOUNTER — Other Ambulatory Visit: Payer: Medicare Other

## 2017-04-07 ENCOUNTER — Ambulatory Visit: Payer: Medicare Other

## 2017-04-07 ENCOUNTER — Ambulatory Visit
Admission: RE | Admit: 2017-04-07 | Discharge: 2017-04-07 | Disposition: A | Discharge status: Home / Self Care | Payer: Medicare Other | Source: Ambulatory Visit | Attending: Internal Medicine | Admitting: Internal Medicine

## 2017-04-07 DIAGNOSIS — N644 Mastodynia: Secondary | ICD-10-CM

## 2017-05-18 ENCOUNTER — Other Ambulatory Visit: Payer: Self-pay | Admitting: Internal Medicine

## 2017-05-18 DIAGNOSIS — M8580 Other specified disorders of bone density and structure, unspecified site: Secondary | ICD-10-CM

## 2017-05-30 ENCOUNTER — Other Ambulatory Visit: Payer: Medicare Other

## 2017-06-13 ENCOUNTER — Ambulatory Visit
Admission: RE | Admit: 2017-06-13 | Discharge: 2017-06-13 | Disposition: A | Discharge status: Home / Self Care | Payer: Medicare Other | Source: Ambulatory Visit | Attending: Internal Medicine | Admitting: Internal Medicine

## 2017-06-13 ENCOUNTER — Other Ambulatory Visit: Payer: Medicare Other

## 2017-06-13 DIAGNOSIS — M8580 Other specified disorders of bone density and structure, unspecified site: Secondary | ICD-10-CM

## 2017-11-14 ENCOUNTER — Encounter (HOSPITAL_COMMUNITY): Payer: Self-pay | Admitting: Oncology

## 2017-11-14 ENCOUNTER — Emergency Department (HOSPITAL_COMMUNITY): Payer: Medicare Other

## 2017-11-14 ENCOUNTER — Emergency Department (HOSPITAL_COMMUNITY)
Admission: EM | Admit: 2017-11-14 | Discharge: 2017-11-14 | Disposition: A | Discharge status: Home / Self Care | Payer: Medicare Other | Attending: Emergency Medicine | Admitting: Emergency Medicine

## 2017-11-14 DIAGNOSIS — Z7982 Long term (current) use of aspirin: Secondary | ICD-10-CM | POA: Insufficient documentation

## 2017-11-14 DIAGNOSIS — M25512 Pain in left shoulder: Secondary | ICD-10-CM | POA: Diagnosis present

## 2017-11-14 DIAGNOSIS — Z79899 Other long term (current) drug therapy: Secondary | ICD-10-CM | POA: Insufficient documentation

## 2017-11-14 DIAGNOSIS — I10 Essential (primary) hypertension: Secondary | ICD-10-CM | POA: Diagnosis not present

## 2017-11-14 DIAGNOSIS — M19012 Primary osteoarthritis, left shoulder: Secondary | ICD-10-CM | POA: Insufficient documentation

## 2017-11-14 LAB — I-STAT CHEM 8, ED
BUN: 22 mg/dL (ref 8–23)
CHLORIDE: 104 mmol/L (ref 98–111)
Calcium, Ion: 1.2 mmol/L (ref 1.15–1.40)
Creatinine, Ser: 1.2 mg/dL — ABNORMAL HIGH (ref 0.44–1.00)
Glucose, Bld: 96 mg/dL (ref 70–99)
HEMATOCRIT: 41 % (ref 36.0–46.0)
Hemoglobin: 13.9 g/dL (ref 12.0–15.0)
Potassium: 4.5 mmol/L (ref 3.5–5.1)
SODIUM: 141 mmol/L (ref 135–145)
TCO2: 25 mmol/L (ref 22–32)

## 2017-11-14 LAB — I-STAT CG4 LACTIC ACID, ED: Lactic Acid, Venous: 1.47 mmol/L (ref 0.5–1.9)

## 2017-11-14 LAB — I-STAT TROPONIN, ED: Troponin i, poc: 0 ng/mL (ref 0.00–0.08)

## 2017-11-14 MED ORDER — DICLOFENAC SODIUM 1 % TD GEL: 4.0000 g | Freq: Four times a day (QID) | TRANSDERMAL | 0 refills | Status: DC

## 2017-11-14 MED ORDER — OXYCODONE HCL 5 MG PO TABS
2.5000 mg | ORAL_TABLET | Freq: Once | ORAL | Status: AC
  Administered 2017-11-14: 2.5 mg via ORAL
  Filled 2017-11-14: qty 1

## 2017-11-14 MED ORDER — OXYCODONE HCL 5 MG PO TABS: 2.5000 mg | ORAL_TABLET | Freq: Four times a day (QID) | ORAL | 0 refills | Status: DC | PRN

## 2017-11-14 MED ORDER — DICLOFENAC SODIUM 1 % TD GEL
4.0000 g | Freq: Once | TRANSDERMAL | Status: AC
  Administered 2017-11-14: 4 g via TOPICAL
  Filled 2017-11-14: qty 100

## 2017-11-14 NOTE — ED Notes (Signed)
Patient transported to X-ray 

## 2017-11-14 NOTE — ED Provider Notes (Signed)
[facility] EMERGENCY DEPARTMENT Provider Note   CSN: [id number] Arrival date & time: 11/14/17  0445     History   Chief Complaint Chief Complaint  Patient presents with  . Shoulder Pain    HPI Robin Moreno is a 84 y.o. female.  HPI 84 year old female with past medical history of osteoarthritis here with left shoulder pain.  The patient states that several days ago, her son accidentally hit a plate from her left hand and her arm fell.  She reports that she immediately felt increasing acute on chronic left shoulder pain.  The pain is aching, throbbing, worse with movement and palpation.  She has tried her home pain meds without relief.  She denies any associated numbness or weakness.  No associated chest pain or shortness of breath.  Pain is worse with movement and palpation.  No alleviating factors.  No other complaints.  No joint swelling.  No fevers or chills.  Past Medical History:  Diagnosis Date  . Arthritis   . Degenerative disk disease   . Hypercholesterolemia   . Hypertension   . Osteoarthritis of right knee     There are no active problems to display for this patient.   Past Surgical History:  Procedure Laterality Date  . ABDOMINAL HYSTERECTOMY    . TOTAL HIP ARTHROPLASTY       OB History   None      Home Medications    Prior to Admission medications   Medication Sig Start Date End Date Taking? Authorizing Provider  aspirin EC 81 MG tablet Take 81 mg by mouth every morning.     [provider]  Cholecalciferol (VITAMIN D) 2000 UNITS tablet Take 2,000 Units by mouth daily.      [provider]  diclofenac sodium (VOLTAREN) 1 % GEL Apply 4 g topically 4 (four) times daily. Apply to your left shoulder 11/14/17   Shaune Pollack, MD  diltiazem (CARDIZEM) 60 MG tablet Take 60 mg by mouth 2 (two) times daily.      [provider]  enalapril (VASOTEC) 10 MG tablet Take 10 mg by mouth daily. 04/08/14   [provider]  fesoterodine (TOVIAZ) 4 MG TB24 Take 4 mg by mouth every morning.    [provider]  hydrochlorothiazide (HYDRODIURIL) 25 MG tablet Take 25 mg by mouth daily.      [provider]  Menthol-Methyl Salicylate (MUSCLE RUB) 10-15 % CREA Apply 1 application topically as needed (for sore muscle).    [provider]  Multiple Vitamin (MULTIVITAMIN WITH MINERALS) TABS Take 1 tablet by mouth daily.    [provider]  oxyCODONE (ROXICODONE) 5 MG immediate release tablet Take 0.5 tablets (2.5 mg total) by mouth every 6 (six) hours as needed for severe pain. 11/14/17   Shaune Pollack, MD  Polyethyl Glycol-Propyl Glycol (SYSTANE OP) Place 1 drop into both eyes every morning.    [provider]  simvastatin (ZOCOR) 40 MG tablet Take 40 mg by mouth at bedtime.      [provider]  solifenacin (VESICARE) 5 MG tablet Take 5 mg by mouth daily.    [provider]  traMADol (ULTRAM) 50 MG tablet Take 50 mg by mouth every 8 (eight) hours as needed for pain. Maximum dose= 8 tablets per day, for pain    [provider]    Family History No family history on file.  Social History Social History   Tobacco Use  . Smoking  status: Never Smoker  . Smokeless tobacco: Never Used  Substance Use Topics  . Alcohol use: No  . Drug use: No     Allergies   Patient has no known allergies.   Review of Systems Review of Systems  Constitutional: Negative for chills, fatigue and fever.  HENT: Negative for congestion and rhinorrhea.   Eyes: Negative for visual disturbance.  Respiratory: Negative for cough, shortness of breath and wheezing.   Cardiovascular: Negative for chest pain and leg swelling.  Gastrointestinal: Negative for abdominal pain, diarrhea, nausea and vomiting.  Genitourinary: Negative for dysuria and flank pain.  Musculoskeletal: Positive for arthralgias. Negative for neck pain and neck stiffness.  Skin: Negative  for rash and wound.  Allergic/Immunologic: Negative for immunocompromised state.  Neurological: Negative for syncope, weakness and headaches.  All other systems reviewed and are negative.    Physical Exam Updated Vital Signs BP (!) 143/77   Pulse 75   Resp 14   Ht 5\' 6"  (1.676 m)   Wt 68 kg   SpO2 100%   BMI 24.21 kg/m   Physical Exam  Constitutional: She is oriented to person, place, and time. She appears well-developed and well-nourished. No distress.  HENT:  Head: Normocephalic and atraumatic.  Mouth/Throat: Oropharynx is clear and moist.  Eyes: Conjunctivae are normal.  Neck: Neck supple.  Cardiovascular: Normal rate, regular rhythm and normal heart sounds. Exam reveals no friction rub.  No murmur heard. Pulmonary/Chest: Effort normal and breath sounds normal. No respiratory distress. She has no wheezes. She has no rales.  Abdominal: She exhibits no distension.  Musculoskeletal: She exhibits no edema.  Neurological: She is alert and oriented to person, place, and time. She exhibits normal muscle tone.  Skin: Skin is warm. Capillary refill takes less than 2 seconds.  Psychiatric: She has a normal mood and affect.  Nursing note and vitals reviewed.   UPPER EXTREMITY EXAM: Left  INSPECTION & PALPATION: Moderate tenderness over left anterior and superior shoulder.  Palpable bone clicking on passive range of motion.  No joint effusion or warmth.  No open wounds.  SENSORY: Sensation is intact to light touch in:  Superficial radial nerve distribution (dorsal first web space) Median nerve distribution (tip of index finger)   Ulnar nerve distribution (tip of small finger)     MOTOR:  + Motor posterior interosseous nerve (thumb IP extension) + Anterior interosseous nerve (thumb IP flexion, index finger DIP flexion) + Radial nerve (wrist extension) + Median nerve (palpable firing thenar mass) + Ulnar nerve (palpable firing of first dorsal interosseous  muscle)  VASCULAR: 2+ radial pulse Brisk capillary refill < 2 sec, fingers warm and well-perfused   ED Treatments / Results  Labs (all labs ordered are listed, but only abnormal results are displayed) Labs Reviewed  I-STAT CHEM 8, ED - Abnormal; Notable for the following components:      Result Value   Creatinine, Ser 1.20 (*)    All other components within normal limits  I-STAT CHEM 8, ED  I-STAT TROPONIN, ED    EKG EKG Interpretation  Date/Time:  Monday November 14 2017 04:58:49 EDT Ventricular Rate:  74 PR Interval:    QRS Duration: 88 QT Interval:  374 QTC Calculation: 415 R Axis:   16 Text Interpretation:  Sinus rhythm Abnormal R-wave progression, early transition LVH with secondary repolarization abnormality Since last EKG, LVH is now present  Non-specific ST-t changes Confirmed by Shaune Pollack [phone]) on 11/14/2017 5:02:23 AM   Radiology Dg Chest 2  View  Result Date: 11/14/2017 CLINICAL DATA:  Left shoulder pain after injury last Friday. EXAM: CHEST - 2 VIEW COMPARISON:  09/24/2012 FINDINGS: Shallow inspiration. Heart size and pulmonary vascularity are normal. Lungs appear clear and expanded. No blunting of costophrenic angles. No pneumothorax. Mediastinal contours appear intact. Calcification of the aorta. Degenerative changes in the spine and shoulders. IMPRESSION: No active cardiopulmonary disease. Electronically Signed   By: Burman Nieves M.D.   On: 11/14/2017 05:25   Dg Shoulder Left  Result Date: 11/14/2017 CLINICAL DATA:  Shoulder pain after injury last Friday. EXAM: LEFT SHOULDER - 2+ VIEW COMPARISON:  11/04/2011 FINDINGS: Degenerative changes in the left shoulder involving the acromioclavicular and glenohumeral joints. Prominent subacromial spur formation. Degenerative changes are similar to previous study. No evidence of acute fracture or dislocation. No focal bone lesion or bone destruction. Bone cortex appears intact. Soft tissues are unremarkable.  IMPRESSION: Degenerative changes in the left shoulder with prominent subacromial spur formation. No acute bony abnormalities. Electronically Signed   By: Burman Nieves M.D.   On: 11/14/2017 05:26    Procedures Procedures (including critical care time)  Medications Ordered in ED Medications  diclofenac sodium (VOLTAREN) 1 % transdermal gel 4 g (4 g Topical Given 11/14/17 0553)  oxyCODONE (Oxy IR/ROXICODONE) immediate release tablet 2.5 mg (2.5 mg Oral Given 11/14/17 0552)     Initial Impression / Assessment and Plan / ED Course  I have reviewed the triage vital signs and the nursing notes.  Pertinent labs & imaging results that were available during my care of the patient were reviewed by me and considered in my medical decision making (see chart for details).     84 year old female with chronic left shoulder pain here with acute on chronic pain.  Imaging shows severe degenerative changes with subacromial spur formation.  Will add topical Voltaren gel.  Patient has been taking high doses of Ultram for her pain.  Given her age and comorbidities, will have her stop this and do a very low dose of oxycodone as needed, less frequently.  She tolerated the dose in the ED without difficulty.  Otherwise, I do not suspect this is any kind of referred pain from other pulmonary or cardiac source.  Her chest x-ray is clear.  EKG shows LVH but no acute changes and troponin is negative.  Discharged with continued supportive care at home.  Final Clinical Impressions(s) / ED Diagnoses   Final diagnoses:  Arthritis of left shoulder region    ED Discharge Orders         Ordered    diclofenac sodium (VOLTAREN) 1 % GEL  4 times daily     11/14/17 0612    oxyCODONE (ROXICODONE) 5 MG immediate release tablet  Every 6 hours PRN     11/14/17 4132           Shaune Pollack, MD 11/14/17 [phone]

## 2017-11-14 NOTE — ED Triage Notes (Signed)
Pt bib [facility] from home d/t left shoulder pain.  Per EMS pt reports having something knocked out of her left hand on Friday and has had left shoulder pain since that time.  No deformity.

## 2017-11-14 NOTE — Discharge Instructions (Signed)
As we discussed, do not use a sling for more than 3 days.  Make sure you continue to move your shoulder, to prevent frozen shoulder.  You can take Tylenol 500 mg every 4 hours for mild pain.  Use the gel 3-4 times a day.  I have given you a stronger pain medication as needed, but be sure to only take this as needed and carefully.  Take it on a full stomach.  Do not take this in addition to the Ultram that you are previously taking at home.

## 2018-07-23 ENCOUNTER — Other Ambulatory Visit: Payer: Self-pay

## 2018-07-23 ENCOUNTER — Emergency Department (HOSPITAL_COMMUNITY)
Admission: EM | Admit: 2018-07-23 | Discharge: 2018-07-23 | Disposition: A | Discharge status: Home / Self Care | Payer: Medicare Other | Attending: Emergency Medicine | Admitting: Emergency Medicine

## 2018-07-23 ENCOUNTER — Emergency Department (HOSPITAL_COMMUNITY): Payer: Medicare Other

## 2018-07-23 ENCOUNTER — Encounter (HOSPITAL_COMMUNITY): Payer: Self-pay | Admitting: Emergency Medicine

## 2018-07-23 DIAGNOSIS — I639 Cerebral infarction, unspecified: Secondary | ICD-10-CM | POA: Insufficient documentation

## 2018-07-23 DIAGNOSIS — R42 Dizziness and giddiness: Secondary | ICD-10-CM | POA: Diagnosis present

## 2018-07-23 DIAGNOSIS — Z79899 Other long term (current) drug therapy: Secondary | ICD-10-CM | POA: Insufficient documentation

## 2018-07-23 DIAGNOSIS — Z7982 Long term (current) use of aspirin: Secondary | ICD-10-CM | POA: Diagnosis not present

## 2018-07-23 DIAGNOSIS — I6381 Other cerebral infarction due to occlusion or stenosis of small artery: Secondary | ICD-10-CM

## 2018-07-23 LAB — COMPREHENSIVE METABOLIC PANEL
ALT: 51 U/L — ABNORMAL HIGH (ref 0–44)
AST: 56 U/L — ABNORMAL HIGH (ref 15–41)
Albumin: 3.1 g/dL — ABNORMAL LOW (ref 3.5–5.0)
Alkaline Phosphatase: 78 U/L (ref 38–126)
Anion gap: 12 (ref 5–15)
BUN: 31 mg/dL — ABNORMAL HIGH (ref 8–23)
CO2: 24 mmol/L (ref 22–32)
Calcium: 9 mg/dL (ref 8.9–10.3)
Chloride: 100 mmol/L (ref 98–111)
Creatinine, Ser: 1.22 mg/dL — ABNORMAL HIGH (ref 0.44–1.00)
GFR calc Af Amer: 45 mL/min — ABNORMAL LOW (ref >60)
GFR calc non Af Amer: 39 mL/min — ABNORMAL LOW (ref >60)
Glucose, Bld: 167 mg/dL — ABNORMAL HIGH (ref 70–99)
Potassium: 5.7 mmol/L — ABNORMAL HIGH (ref 3.5–5.1)
Sodium: 136 mmol/L (ref 135–145)
Total Bilirubin: 1.2 mg/dL (ref 0.3–1.2)
Total Protein: 6.5 g/dL (ref 6.5–8.1)

## 2018-07-23 LAB — DIFFERENTIAL
Abs Immature Granulocytes: 0.8 10*3/uL — ABNORMAL HIGH (ref 0.00–0.07)
Basophils Absolute: 0.1 10*3/uL (ref 0.0–0.1)
Basophils Relative: 1 %
Eosinophils Absolute: 0.2 10*3/uL (ref 0.0–0.5)
Eosinophils Relative: 1 %
Immature Granulocytes: 5 %
Lymphocytes Relative: 29 %
Lymphs Abs: 4.2 10*3/uL — ABNORMAL HIGH (ref 0.7–4.0)
Monocytes Absolute: 1.3 10*3/uL — ABNORMAL HIGH (ref 0.1–1.0)
Monocytes Relative: 9 %
Neutro Abs: 8.1 10*3/uL — ABNORMAL HIGH (ref 1.7–7.7)
Neutrophils Relative %: 55 %

## 2018-07-23 LAB — URINALYSIS, ROUTINE W REFLEX MICROSCOPIC
Bilirubin Urine: NEGATIVE
Glucose, UA: NEGATIVE mg/dL
Hgb urine dipstick: NEGATIVE
Ketones, ur: NEGATIVE mg/dL
Leukocytes,Ua: NEGATIVE
Nitrite: NEGATIVE
Protein, ur: NEGATIVE mg/dL
Specific Gravity, Urine: 1.015 (ref 1.005–1.030)
pH: 7 (ref 5.0–8.0)

## 2018-07-23 LAB — CBC
HCT: 38.6 % (ref 36.0–46.0)
Hemoglobin: 12.5 g/dL (ref 12.0–15.0)
MCH: 30.5 pg (ref 26.0–34.0)
MCHC: 32.4 g/dL (ref 30.0–36.0)
MCV: 94.1 fL (ref 80.0–100.0)
Platelets: 243 10*3/uL (ref 150–400)
RBC: 4.1 MIL/uL (ref 3.87–5.11)
RDW: 13.2 % (ref 11.5–15.5)
WBC: 14.7 10*3/uL — ABNORMAL HIGH (ref 4.0–10.5)
nRBC: 0.1 % (ref 0.0–0.2)

## 2018-07-23 LAB — CBG MONITORING, ED: Glucose-Capillary: 143 mg/dL — ABNORMAL HIGH (ref 70–99)

## 2018-07-23 LAB — I-STAT CREATININE, ED: Creatinine, Ser: 1.2 mg/dL — ABNORMAL HIGH (ref 0.44–1.00)

## 2018-07-23 LAB — PROTIME-INR
INR: 1.1 (ref 0.8–1.2)
Prothrombin Time: 14.4 seconds (ref 11.4–15.2)

## 2018-07-23 LAB — APTT: aPTT: 25 seconds (ref 24–36)

## 2018-07-23 MED ORDER — METOCLOPRAMIDE HCL 5 MG/ML IJ SOLN
10.0000 mg | Freq: Once | INTRAMUSCULAR | Status: AC
  Administered 2018-07-23: 13:00:00 10 mg via INTRAVENOUS
  Filled 2018-07-23: qty 2

## 2018-07-23 MED ORDER — MECLIZINE HCL 12.5 MG PO TABS: 12.5000 mg | ORAL_TABLET | Freq: Three times a day (TID) | ORAL | 0 refills | Status: AC | PRN

## 2018-07-23 MED ORDER — HYDROCHLOROTHIAZIDE 25 MG PO TABS: 25.0000 mg | ORAL_TABLET | Freq: Once | ORAL | Status: DC

## 2018-07-23 MED ORDER — DIAZEPAM 2 MG PO TABS
2.0000 mg | ORAL_TABLET | Freq: Once | ORAL | Status: AC
  Administered 2018-07-23: 2 mg via ORAL
  Filled 2018-07-23: qty 1

## 2018-07-23 MED ORDER — DILTIAZEM HCL 60 MG PO TABS
60.0000 mg | ORAL_TABLET | Freq: Once | ORAL | Status: DC
  Filled 2018-07-23: qty 1

## 2018-07-23 MED ORDER — ENALAPRIL MALEATE 10 MG PO TABS
10.0000 mg | ORAL_TABLET | Freq: Once | ORAL | Status: DC
  Filled 2018-07-23: qty 1

## 2018-07-23 MED ORDER — ASPIRIN EC 325 MG PO TBEC: 325.0000 mg | DELAYED_RELEASE_TABLET | Freq: Every day | ORAL | 1 refills | Status: DC

## 2018-07-23 MED ORDER — SODIUM CHLORIDE 0.9% FLUSH
3.0000 mL | Freq: Once | INTRAVENOUS | Status: AC
  Administered 2018-07-23: 09:00:00 3 mL via INTRAVENOUS

## 2018-07-23 MED ORDER — ONDANSETRON HCL 4 MG/2ML IJ SOLN
4.0000 mg | Freq: Once | INTRAMUSCULAR | Status: AC
  Administered 2018-07-23: 09:00:00 4 mg via INTRAVENOUS
  Filled 2018-07-23: qty 2

## 2018-07-23 MED ORDER — MECLIZINE HCL 25 MG PO TABS
25.0000 mg | ORAL_TABLET | Freq: Once | ORAL | Status: AC
  Administered 2018-07-23: 25 mg via ORAL
  Filled 2018-07-23: qty 1

## 2018-07-23 MED ORDER — DIAZEPAM 2 MG PO TABS
2.0000 mg | ORAL_TABLET | Freq: Once | ORAL | Status: AC
  Administered 2018-07-23: 13:00:00 2 mg via ORAL
  Filled 2018-07-23: qty 1

## 2018-07-23 MED ORDER — ASPIRIN 81 MG PO CHEW: 324.0000 mg | CHEWABLE_TABLET | Freq: Once | ORAL | Status: DC

## 2018-07-23 NOTE — ED Notes (Signed)
To mri 

## 2018-07-23 NOTE — ED Triage Notes (Signed)
Pt reports she woke up and was watching television. Pt reports that at 0700 she went out of her room to get a cup of coffee and a slice of bread when she felt dizzy. Pt reports a hx of vertigo and medication usage to calm same however the medication is not working.

## 2018-07-23 NOTE — ED Provider Notes (Signed)
[facility] EMERGENCY DEPARTMENT Provider Note   CSN: [id number] Arrival date & time: 07/23/18  4163    History   Chief Complaint Chief Complaint  Patient presents with  . Dizziness    HPI Robin Moreno is a 85 y.o. female.     HPI 85 year old female who presents the emergency department with acute onset "dizziness" this morning.  She developed nausea and vomiting as well.  She feels like things are moving.  She denies focal weakness of her arms or legs.  Denies headache at this time.  She has had a history of vertigo before.  No meds attempted prior to arrival.  No diarrhea.  No fevers or chills.  No chest pain or shortness of breath.  No recent cough.  No other complaints.  Symptoms are severe in severity.  Vomiting in the room.     Past Medical History:  Diagnosis Date  . Arthritis   . Degenerative disk disease   . Hypercholesterolemia   . Hypertension   . Osteoarthritis of right knee     There are no active problems to display for this patient.   Past Surgical History:  Procedure Laterality Date  . ABDOMINAL HYSTERECTOMY    . TOTAL HIP ARTHROPLASTY       OB History   No obstetric history on file.      Home Medications    Prior to Admission medications   Medication Sig Start Date End Date Taking? Authorizing Provider  aspirin EC 81 MG tablet Take 81 mg by mouth every morning.     [provider]  Cholecalciferol (VITAMIN D) 2000 UNITS tablet Take 2,000 Units by mouth daily.      [provider]  diclofenac sodium (VOLTAREN) 1 % GEL Apply 4 g topically 4 (four) times daily. Apply to your left shoulder 11/14/17   Shaune Pollack, MD  diltiazem (CARDIZEM) 60 MG tablet Take 60 mg by mouth 2 (two) times daily.      [provider]  enalapril (VASOTEC) 10 MG tablet Take 10 mg by mouth daily. 04/08/14   [provider]  fesoterodine (TOVIAZ) 4 MG TB24 Take 4 mg by mouth every morning.    [provider]  hydrochlorothiazide (HYDRODIURIL) 25 MG tablet Take 25 mg by mouth daily.      [provider]  Menthol-Methyl Salicylate (MUSCLE RUB) 10-15 % CREA Apply 1 application topically as needed (for sore muscle).    [provider]  Multiple Vitamin (MULTIVITAMIN WITH MINERALS) TABS Take 1 tablet by mouth daily.    [provider]  oxyCODONE (ROXICODONE) 5 MG immediate release tablet Take 0.5 tablets (2.5 mg total) by mouth every 6 (six) hours as needed for severe pain. 11/14/17   Shaune Pollack, MD  Polyethyl Glycol-Propyl Glycol (SYSTANE OP) Place 1 drop into both eyes every morning.    [provider]  simvastatin (ZOCOR) 40 MG tablet Take 40 mg by mouth at bedtime.      [provider]  solifenacin (VESICARE) 5 MG tablet Take 5 mg by mouth daily.    [provider]  traMADol (ULTRAM) 50 MG tablet Take 50 mg by mouth every 8 (eight) hours as needed for pain. Maximum dose= 8 tablets per day, for pain    [provider]    Family History No family history on file.  Social History Social History   Tobacco Use  . Smoking status: Never Smoker  . Smokeless tobacco: Never Used  Substance Use Topics  . Alcohol use: No  . Drug use: No     Allergies   Patient has no known allergies.   Review of Systems Review of Systems  All other systems reviewed and are negative.    Physical Exam Updated Vital Signs BP (!) 115/102   Pulse 71   Temp 97.8 F (36.6 C) (Oral)   Resp (!) 21   Ht 5\' 7"  (1.702 m)   Wt 72.6 kg   LMP  (Exact Date)   SpO2 96%   BMI 25.06 kg/m   Physical Exam Vitals signs and nursing note reviewed.  Constitutional:      General: She is not in acute distress.    Appearance: She is well-developed.  HENT:     Head: Normocephalic and atraumatic.  Eyes:     Pupils: Pupils are equal, round, and reactive to light.  Neck:     Musculoskeletal: Normal range of motion.  Cardiovascular:     Rate and  Rhythm: Normal rate and regular rhythm.     Heart sounds: Normal heart sounds.  Pulmonary:     Effort: Pulmonary effort is normal.     Breath sounds: Normal breath sounds.  Abdominal:     General: There is no distension.     Palpations: Abdomen is soft.     Tenderness: There is no abdominal tenderness.  Musculoskeletal: Normal range of motion.  Skin:    General: Skin is warm and dry.  Neurological:     Mental Status: She is alert and oriented to person, place, and time.     Comments: 5/5 strength in major muscle groups of  bilateral upper and lower extremities. Speech normal. No facial asymetry.   Psychiatric:        Judgment: Judgment normal.      ED Treatments / Results  Labs (all labs ordered are listed, but only abnormal results are displayed) Labs Reviewed  CBC - Abnormal; Notable for the following components:      Result Value   WBC 14.7 (*)    All other components within normal limits  DIFFERENTIAL - Abnormal; Notable for the following components:   Neutro Abs 8.1 (*)    Lymphs Abs 4.2 (*)    Monocytes Absolute 1.3 (*)    Abs Immature Granulocytes 0.80 (*)    All other components within normal limits  COMPREHENSIVE METABOLIC PANEL - Abnormal; Notable for the following components:   Potassium 5.7 (*)    Glucose, Bld 167 (*)    BUN 31 (*)    Creatinine, Ser 1.22 (*)    Albumin 3.1 (*)    AST 56 (*)    ALT 51 (*)    GFR calc non Af Amer 39 (*)    GFR calc Af Amer 45 (*)    All other components within normal limits  URINALYSIS, ROUTINE W REFLEX MICROSCOPIC - Abnormal; Notable for the following components:   APPearance HAZY (*)    All other components within normal limits  I-STAT CREATININE, ED - Abnormal; Notable for the following components:   Creatinine, Ser 1.20 (*)    All other components within normal limits  CBG MONITORING, ED - Abnormal; Notable for the following components:   Glucose-Capillary 143 (*)    All other components within normal limits   PROTIME-INR  APTT    EKG EKG Interpretation  Date/Time:  Sunday July 23 2018 08:34:18 EDT Ventricular Rate:  75 PR Interval:    QRS Duration: 94 QT  Interval:  398 QTC Calculation: 445 R Axis:   25 Text Interpretation:  Accelerated junctional rhythm Borderline T abnormalities, diffuse leads Minimal ST elevation, anterior leads No significant change was found Confirmed by Azalia Bilis ([id number]) on 07/23/2018 4:22:12 PM   Radiology Ct Head Wo Contrast  Result Date: 07/23/2018 CLINICAL DATA:  Dizziness.  History of vertigo. EXAM: CT HEAD WITHOUT CONTRAST TECHNIQUE: Contiguous axial images were obtained from the base of the skull through the vertex without intravenous contrast. COMPARISON:  May 12, 2014 FINDINGS: Brain: No subdural, epidural, or subarachnoid hemorrhage. Ventricles and sulci are mildly prominent but stable. Cerebellum, brainstem, and basal cisterns are normal. No mass effect or midline shift. Mild white matter changes. No acute cortical ischemia or infarct. Vascular: No hyperdense vessel or unexpected calcification. Skull: Normal. Negative for fracture or focal lesion. Sinuses/Orbits: No acute finding. Other: None. IMPRESSION: No acute intracranial abnormalities. Electronically Signed   By: Gerome Sam III M.D   On: 07/23/2018 09:13    Procedures Procedures (including critical care time)  Medications Ordered in ED Medications  sodium chloride flush (NS) 0.9 % injection 3 mL (3 mLs Intravenous Given 07/23/18 0853)  ondansetron (ZOFRAN) injection 4 mg (4 mg Intravenous Given 07/23/18 0852)  diazepam (VALIUM) tablet 2 mg (2 mg Oral Given 07/23/18 0953)  diazepam (VALIUM) tablet 2 mg (2 mg Oral Given 07/23/18 1303)  meclizine (ANTIVERT) tablet 25 mg (25 mg Oral Given 07/23/18 1303)  metoCLOPramide (REGLAN) injection 10 mg (10 mg Intravenous Given 07/23/18 1319)     Initial Impression / Assessment and Plan / ED Course  I have reviewed the triage vital signs and the  nursing notes.  Pertinent labs & imaging results that were available during my care of the patient were reviewed by me and considered in my medical decision making (see chart for details).        Despite several attempts at managing her vertiginous symptoms she continues to be symptomatic.  CT imaging without acute abnormality.  Patient will undergo MRI imaging and additional attempts at symptom control.  MRI to evaluate for possible central cause.  If MRI is negative and patient is able to ambulate she is safe for discharge from the emergency department likely discharged home with a scription for meclizine.  If continues to be symptomatic she may benefit from overnight observational management with the hospitalist service.  Care transferred to Dr. Criss Alvine for follow-up of MRI and disposition planning  Final Clinical Impressions(s) / ED Diagnoses   Final diagnoses:  None    ED Discharge Orders    None       Azalia Bilis, MD 07/23/18 1624

## 2018-07-23 NOTE — ED Notes (Signed)
The edp dr Criss Alvine reports that the Robin Moreno  Can take her own meds she has  With her

## 2018-07-23 NOTE — ED Notes (Signed)
Steady gait at discharge

## 2018-07-23 NOTE — ED Notes (Signed)
Called MRI in regards to pt getting her scan and they advised they were only running 1 scanner at this time and are backed up.

## 2018-07-23 NOTE — ED Notes (Signed)
Pt son Robin Moreno [phone]

## 2018-07-23 NOTE — Discharge Instructions (Signed)
Your MRI shows a very small stroke in your left thalamus.  This does not appear to be causing her symptoms today but is concerning and the fact that we need to better control your blood pressure and other risk factors.  If you develop weakness, numbness in your extremities, trouble speaking or swallowing, severe headache or any other new/concerning symptoms then return to the ER for evaluation.  Otherwise, you need to follow-up with your primary doctor and neurologist.  We are increasing your aspirin from a baby aspirin to 325 mg tablet.  You have been given your morning doses of blood pressure medicine here today.

## 2018-07-23 NOTE — ED Provider Notes (Signed)
Care transferred to me. Patient ambulated after MRI, did well with walker (her baseline). MRI shows possible thalamic infarct. Discussed with Dr. Wilford Corner, who viewed this and we discussed case.  He feels this is probably real and probably related to hypertension but not embolic and not causing her symptoms today.  Given this, he would increase her aspirin to a full dose aspirin.  Close outpatient follow-up with neurology but given current pandemic he would prefer for her to be treated as an outpatient.  She lives at home with her son Daryle, who I have spoken to.  He feels comfortable taking her home.  She has been given her morning medicines here this afternoon because she did not take them this morning.  She will take her home medicine she brought in.  Otherwise, given the vertigo is resolved and she feels well and has someone that she lives with, I think is reasonable to discharge her home with strict return precautions.  She does not have any complaints that would correlate to the thalamus such as sensory changes or even motor changes.  Discharged home with strict return precautions.  She is noted to have mild hyperkalemia but this appears to be hemolyzed and I doubt is clinically significant.   Results for orders placed or performed during the hospital encounter of 07/23/18  Protime-INR  Result Value Ref Range   Prothrombin Time 14.4 11.4 - 15.2 seconds   INR 1.1 0.8 - 1.2  APTT  Result Value Ref Range   aPTT 25 24 - 36 seconds  CBC  Result Value Ref Range   WBC 14.7 (H) 4.0 - 10.5 K/uL   RBC 4.10 3.87 - 5.11 MIL/uL   Hemoglobin 12.5 12.0 - 15.0 g/dL   HCT 63.8 75.6 - 43.3 %   MCV 94.1 80.0 - 100.0 fL   MCH 30.5 26.0 - 34.0 pg   MCHC 32.4 30.0 - 36.0 g/dL   RDW 29.5 18.8 - 41.6 %   Platelets 243 150 - 400 K/uL   nRBC 0.1 0.0 - 0.2 %  Differential  Result Value Ref Range   Neutrophils Relative % 55 %   Neutro Abs 8.1 (H) 1.7 - 7.7 K/uL   Lymphocytes Relative 29 %   Lymphs Abs 4.2 (H)  0.7 - 4.0 K/uL   Monocytes Relative 9 %   Monocytes Absolute 1.3 (H) 0.1 - 1.0 K/uL   Eosinophils Relative 1 %   Eosinophils Absolute 0.2 0.0 - 0.5 K/uL   Basophils Relative 1 %   Basophils Absolute 0.1 0.0 - 0.1 K/uL   Immature Granulocytes 5 %   Abs Immature Granulocytes 0.80 (H) 0.00 - 0.07 K/uL  Comprehensive metabolic panel  Result Value Ref Range   Sodium 136 135 - 145 mmol/L   Potassium 5.7 (H) 3.5 - 5.1 mmol/L   Chloride 100 98 - 111 mmol/L   CO2 24 22 - 32 mmol/L   Glucose, Bld 167 (H) 70 - 99 mg/dL   BUN 31 (H) 8 - 23 mg/dL   Creatinine, Ser 6.06 (H) 0.44 - 1.00 mg/dL   Calcium 9.0 8.9 - 30.1 mg/dL   Total Protein 6.5 6.5 - 8.1 g/dL   Albumin 3.1 (L) 3.5 - 5.0 g/dL   AST 56 (H) 15 - 41 U/L   ALT 51 (H) 0 - 44 U/L   Alkaline Phosphatase 78 38 - 126 U/L   Total Bilirubin 1.2 0.3 - 1.2 mg/dL   GFR calc non Af Amer 39 (L) >60  mL/min   GFR calc Af Amer 45 (L) >60 mL/min   Anion gap 12 5 - 15  Urinalysis, Routine w reflex microscopic  Result Value Ref Range   Color, Urine YELLOW YELLOW   APPearance HAZY (A) CLEAR   Specific Gravity, Urine 1.015 1.005 - 1.030   pH 7.0 5.0 - 8.0   Glucose, UA NEGATIVE NEGATIVE mg/dL   Hgb urine dipstick NEGATIVE NEGATIVE   Bilirubin Urine NEGATIVE NEGATIVE   Ketones, ur NEGATIVE NEGATIVE mg/dL   Protein, ur NEGATIVE NEGATIVE mg/dL   Nitrite NEGATIVE NEGATIVE   Leukocytes,Ua NEGATIVE NEGATIVE  I-stat Creatinine, ED  Result Value Ref Range   Creatinine, Ser 1.20 (H) 0.44 - 1.00 mg/dL  CBG monitoring, ED  Result Value Ref Range   Glucose-Capillary 143 (H) 70 - 99 mg/dL   Ct Head Wo Contrast  Result Date: 07/23/2018 CLINICAL DATA:  Dizziness.  History of vertigo. EXAM: CT HEAD WITHOUT CONTRAST TECHNIQUE: Contiguous axial images were obtained from the base of the skull through the vertex without intravenous contrast. COMPARISON:  May 12, 2014 FINDINGS: Brain: No subdural, epidural, or subarachnoid hemorrhage. Ventricles and sulci  are mildly prominent but stable. Cerebellum, brainstem, and basal cisterns are normal. No mass effect or midline shift. Mild white matter changes. No acute cortical ischemia or infarct. Vascular: No hyperdense vessel or unexpected calcification. Skull: Normal. Negative for fracture or focal lesion. Sinuses/Orbits: No acute finding. Other: None. IMPRESSION: No acute intracranial abnormalities. Electronically Signed   By: Gerome Sam III M.D   On: 07/23/2018 09:13   Mr Brain Wo Contrast  Result Date: 07/23/2018 CLINICAL DATA:  Vertigo, persistent central. EXAM: MRI HEAD WITHOUT CONTRAST TECHNIQUE: Multiplanar, multiecho pulse sequences of the brain and surrounding structures were obtained without intravenous contrast. COMPARISON:  CT head 07/23/2018 FINDINGS: Brain: Mild atrophy and mild chronic microvascular ischemic changes in the white matter and pons. Possible punctate acute infarct left thalamus measuring approximately 2 mm. Otherwise no acute infarct or hemorrhage. No mass lesion or midline shift. Vascular: Normal arterial flow voids Skull and upper cervical spine: Negative Sinuses/Orbits: Paranasal sinuses clear.  Bilateral cataract surgery Other: None IMPRESSION: Possible tiny acute infarct left thalamus Atrophy and chronic microvascular ischemic change. Electronically Signed   By: Marlan Palau M.D.   On: 07/23/2018 16:46      Pricilla Loveless, MD 07/23/18 1719

## 2018-07-23 NOTE — ED Notes (Signed)
Pt was cleaned up walked in hallway with wqalker tolerated well

## 2018-07-23 NOTE — ED Notes (Signed)
Patient sitting on side of bed. She C/O being dizzy. Patient movied to standing beside bed, unsteady gate. Patient returned to bed.

## 2018-08-02 ENCOUNTER — Telehealth: Payer: Self-pay

## 2018-08-02 NOTE — Telephone Encounter (Signed)
I calle pts son Daryle and stated could the appt be ar 0100 or 130pm. The son stated he will not be at home at that time and can only do visit at 230pm. He has a smart phone and can access emails from his phone.Also he knows to click the link in email 10 minutes prior to meeting and type in pts first and last name. ALso to start video meeting with DR. Sethi.He verbalized understanding.

## 2018-08-03 ENCOUNTER — Encounter: Payer: Self-pay | Admitting: Neurology

## 2018-08-03 ENCOUNTER — Other Ambulatory Visit: Payer: Self-pay

## 2018-08-03 ENCOUNTER — Ambulatory Visit (INDEPENDENT_AMBULATORY_CARE_PROVIDER_SITE_OTHER): Payer: Medicare Other | Admitting: Neurology

## 2018-08-03 DIAGNOSIS — R42 Dizziness and giddiness: Secondary | ICD-10-CM | POA: Diagnosis not present

## 2018-08-03 DIAGNOSIS — G459 Transient cerebral ischemic attack, unspecified: Secondary | ICD-10-CM

## 2018-08-03 NOTE — Progress Notes (Signed)
Virtual Visit via Video Note  I connected with Robin Moreno on 08/03/18 at  2:30 PM EDT by a video enabled telemedicine application and verified that I am speaking with the correct person using two identifiers.  This visit was started using [email or web address] app for video and audio but due to severe technical glitches with the audio test it was completed with the audio portion being done via telephone  Location: Patient: At her home with her son Provider: At [facility] office Referring physician : Ralene Ok Reason for referral : dizzy spell   I discussed the limitations of evaluation and management by telemedicine and the availability of in person appointments. The patient expressed understanding and agreed to proceed.  History of Present Illness: [name] is a pleasant 85 year old African-American lady who is accompanied for today's visit by her son Robin Moreno who provides most of the history.  I have reviewed electronic medical records as well as imaging films in PACS.  Patient developed sudden onset of dizziness, vertigo, nausea and vomiting on 07/23/2018.  She needed assistance to walk.  She denied headache, blurred vision, double vision, focal extremity weakness or numbness.  She was off balance and needed help to walk.  Her symptoms persisted and she was brought to the emergency room where she was evaluated.  White count was slightly elevated at 14.7 and creatinine at 1.2 otherwise labs were unremarkable.  An MRI scan of the brain was obtained which showed a questionable tiny punctate diffusion hyperintensity in the left thalamus which could not be appreciated on the coronal sequences or an ADC map.  There is no definite brainstem or cerebellar infarct noted.  Patient's symptoms improved rapidly and she was discharged home.  She is started on aspirin as well as medications for blood pressure and cholesterol.  Patient has done well since discharge.  Her dizziness has not returned.  She does however complain of  chronic tinnitus in the right ear as well as decreased hearing.  Patient and son deny to me any prior history of positional vertigo but I find it documented in the ER notes.  She does have a chronic peripheral neuropathy.  But states that this is mostly stable and not bothersome.  She does ambulate short distances independently and denies falls or injuries.  She has no known prior history of strokes, TIAs, seizures.  Past medical history hypertension, hyperlipidemia, osteoarthritis, degenerative disc disease Past surgical history abdominal hysterectomy, total hip arthroplasty. Medication list aspirin 325 mg daily vitamin D 2000 units tablet daily Voltaren gel apply 4 g topically 4 times daily to left shoulder.  Cardizem 60 mg twice daily.  Vasotec 10 mg daily.  Toviaz 4 mg every morning.  Hydro-chlorothiazide 25 mg daily.  Meclizine 12.5 mg 3 times daily as needed.  Multivitamin daily.  Zocor 40 mg at bedtime.  Vesicare 5 mg daily.  Tramadol 50 mg every 8 hourly as needed for pain. Allergies patient has no known allergies. 14 system review of systems is negative except for decreased hearing and those symptoms documented above in history of present illness. Social history patient does not smoke or drink.  She is living at home with her son.  She is mostly independent with most activities of daily living. Observations/Objective: Physical and neurological exam is limited secondary to constraints from virtual video visit.  Pleasant elderly African-American lady who appears comfortable and not in distress.  She appears extremely hard of hearing and her son has to intervene and communicate with her.  Awake alert oriented to time place and person.  Diminished attention, registration.  Recall 0/3.  Able to name 9 animals which can walk on 4 legs.  Follows commands well.  Face appears symmetric.  Eye movements full range.  Tongue midline.  Motor system exam limited exam but no obvious focal weakness.  Gait not  tested.  Assessment 85 year old lady with transient episode of dizziness, vertigo, nausea and vomiting and gait ataxia in April 2020 possibly posterior circulation TIA versus peripheral labyrinthine dysfunction.  Abnormal MRI scan showing tiny punctate left thalamic hyperintensity possibly a silent lacunar infarct from small vessel disease.  Vascular risk factor of age, hypertension hyperlipidemia and cerebrovascular disease. Plan:  I had a long discussion with the patient and her son regarding her episode of dizziness, discussed differential diagnosis, evaluation and treatment plan and answered questions.  Fortunately she seems to have recovered well and has no lasting deficits.  Her MRI scan shows a questionable tiny silent lacunar infarct and she may be at risk for further strokes.  I recommend she continue aspirin 325 mg daily for stroke prevention and maintain strict control of hypertension with blood pressure goal below 130/90, lipids with LDL cholesterol goal below 70 mg percent and diabetes with hemoglobin A1c goal below 6.5%.  I also encouraged her to keep herself well-hydrated and be compliant with her medicines.  Check CT angiogram of the brain and neck to evaluate posterior circulation vessels as well as lipid profile and hemoglobin A1c.  We also briefly discussed fall and safety precautions.  She will return for a in person follow-up visit in a month or call earlier if necessary.   Follow Up Instructions:    I discussed the assessment and treatment plan with the patient. The patient was provided an opportunity to ask questions and all were answered. The patient agreed with the plan and demonstrated an understanding of the instructions.   The patient was advised to call back or seek an in-person evaluation if the symptoms worsen or if the condition fails to improve as anticipated.  I provided 32 minutes of non-face-to-face time during this encounter.   Delia Heady, MD

## 2018-08-07 ENCOUNTER — Telehealth: Payer: Self-pay | Admitting: Neurology

## 2018-08-07 NOTE — Telephone Encounter (Signed)
[employer] medicare/medicaid order sent to [facility]. No auth they will reach out to the pt to schedule.  °

## 2018-09-07 ENCOUNTER — Ambulatory Visit
Admission: RE | Admit: 2018-09-07 | Discharge: 2018-09-07 | Disposition: A | Discharge status: Home / Self Care | Payer: Medicare Other | Source: Ambulatory Visit | Attending: Neurology | Admitting: Neurology

## 2018-09-07 ENCOUNTER — Other Ambulatory Visit: Payer: Self-pay

## 2018-09-07 DIAGNOSIS — G459 Transient cerebral ischemic attack, unspecified: Secondary | ICD-10-CM

## 2018-09-07 MED ORDER — IOPAMIDOL (ISOVUE-370) INJECTION 76%
75.0000 mL | Freq: Once | INTRAVENOUS | Status: AC | PRN
  Administered 2018-09-07: 12:00:00 75 mL via INTRAVENOUS

## 2018-09-12 ENCOUNTER — Emergency Department (HOSPITAL_COMMUNITY): Payer: Medicare Other

## 2018-09-12 ENCOUNTER — Other Ambulatory Visit: Payer: Self-pay

## 2018-09-12 ENCOUNTER — Emergency Department (HOSPITAL_COMMUNITY)
Admission: EM | Admit: 2018-09-12 | Discharge: 2018-09-12 | Disposition: A | Discharge status: Home / Self Care | Payer: Medicare Other | Attending: Emergency Medicine | Admitting: Emergency Medicine

## 2018-09-12 DIAGNOSIS — Z7982 Long term (current) use of aspirin: Secondary | ICD-10-CM | POA: Diagnosis not present

## 2018-09-12 DIAGNOSIS — G8929 Other chronic pain: Secondary | ICD-10-CM | POA: Diagnosis not present

## 2018-09-12 DIAGNOSIS — Z79899 Other long term (current) drug therapy: Secondary | ICD-10-CM | POA: Diagnosis not present

## 2018-09-12 DIAGNOSIS — R05 Cough: Secondary | ICD-10-CM | POA: Insufficient documentation

## 2018-09-12 DIAGNOSIS — M7918 Myalgia, other site: Secondary | ICD-10-CM | POA: Diagnosis present

## 2018-09-12 DIAGNOSIS — M2550 Pain in unspecified joint: Secondary | ICD-10-CM | POA: Diagnosis not present

## 2018-09-12 DIAGNOSIS — I10 Essential (primary) hypertension: Secondary | ICD-10-CM | POA: Diagnosis not present

## 2018-09-12 LAB — CBC WITH DIFFERENTIAL/PLATELET
Abs Immature Granulocytes: 0.13 10*3/uL — ABNORMAL HIGH (ref 0.00–0.07)
Basophils Absolute: 0.1 10*3/uL (ref 0.0–0.1)
Basophils Relative: 1 %
Eosinophils Absolute: 0.2 10*3/uL (ref 0.0–0.5)
Eosinophils Relative: 2 %
HCT: 38.5 % (ref 36.0–46.0)
Hemoglobin: 11.7 g/dL — ABNORMAL LOW (ref 12.0–15.0)
Immature Granulocytes: 2 %
Lymphocytes Relative: 20 %
Lymphs Abs: 1.8 10*3/uL (ref 0.7–4.0)
MCH: 29.3 pg (ref 26.0–34.0)
MCHC: 30.4 g/dL (ref 30.0–36.0)
MCV: 96.3 fL (ref 80.0–100.0)
Monocytes Absolute: 0.9 10*3/uL (ref 0.1–1.0)
Monocytes Relative: 10 %
Neutro Abs: 5.9 10*3/uL (ref 1.7–7.7)
Neutrophils Relative %: 65 %
Platelets: 314 10*3/uL (ref 150–400)
RBC: 4 MIL/uL (ref 3.87–5.11)
RDW: 13.3 % (ref 11.5–15.5)
WBC: 9 10*3/uL (ref 4.0–10.5)
nRBC: 0 % (ref 0.0–0.2)

## 2018-09-12 LAB — URINALYSIS, ROUTINE W REFLEX MICROSCOPIC
Bilirubin Urine: NEGATIVE
Glucose, UA: NEGATIVE mg/dL
Hgb urine dipstick: NEGATIVE
Ketones, ur: NEGATIVE mg/dL
Leukocytes,Ua: NEGATIVE
Nitrite: POSITIVE — AB
Protein, ur: NEGATIVE mg/dL
Specific Gravity, Urine: 1.015 (ref 1.005–1.030)
pH: 5 (ref 5.0–8.0)

## 2018-09-12 LAB — BASIC METABOLIC PANEL
Anion gap: 10 (ref 5–15)
BUN: 19 mg/dL (ref 8–23)
CO2: 24 mmol/L (ref 22–32)
Calcium: 9.1 mg/dL (ref 8.9–10.3)
Chloride: 104 mmol/L (ref 98–111)
Creatinine, Ser: 1.12 mg/dL — ABNORMAL HIGH (ref 0.44–1.00)
GFR calc Af Amer: 50 mL/min — ABNORMAL LOW (ref >60)
GFR calc non Af Amer: 43 mL/min — ABNORMAL LOW (ref >60)
Glucose, Bld: 91 mg/dL (ref 70–99)
Potassium: 5 mmol/L (ref 3.5–5.1)
Sodium: 138 mmol/L (ref 135–145)

## 2018-09-12 MED ORDER — GABAPENTIN 300 MG PO CAPS
300.0000 mg | ORAL_CAPSULE | Freq: Once | ORAL | Status: AC
  Administered 2018-09-12: 300 mg via ORAL
  Filled 2018-09-12: qty 1

## 2018-09-12 MED ORDER — FOSFOMYCIN TROMETHAMINE 3 G PO PACK
3.0000 g | PACK | Freq: Once | ORAL | Status: AC
  Administered 2018-09-12: 3 g via ORAL
  Filled 2018-09-12: qty 3

## 2018-09-12 MED ORDER — GABAPENTIN 300 MG PO CAPS: ORAL_CAPSULE | ORAL | 0 refills | Status: DC

## 2018-09-12 NOTE — ED Triage Notes (Signed)
Pt c/o generalized pain with arthritis, wants treatment to improve her arthritis.  Pain localizes to posterior shoulders and neck.  No new injury.  Pt very HOH, in NAD.   Denies any other sx.

## 2018-09-12 NOTE — ED Provider Notes (Signed)
[facility] EMERGENCY DEPARTMENT Provider Note   CSN: [id number] Arrival date & time: 09/12/18  0753    History   Chief Complaint No chief complaint on file.   HPI INDICA Robin Moreno is a 85 y.o. female with history of hypertension, generalized osteoarthritis, degenerative disc disease who presents with progressively worsening joint pain.  She reports it is been going on for a while, but seems to be worsening despite taking tramadol.  Patient denies any injury.  She reports her shoulders are the worst of the pain when she moves, especially when she puts her arms down to get up from the bed.  She also reports bilateral knee pain.  She denies any known fevers at home, but is found to have low-grade temperature in the ED.  She reports she has been coughing for 2 years related to allergies, but no new cough, shortness of breath, chest pain.  She also denies any abdominal pain, nausea, vomiting, urinary symptoms.  Patient states this is the first time she is left her house in 3 months.  She has not been around any sick contacts.     HPI  Past Medical History:  Diagnosis Date  . Arthritis   . Degenerative disk disease   . Hypercholesterolemia   . Hypertension   . Osteoarthritis of right knee     There are no active problems to display for this patient.   Past Surgical History:  Procedure Laterality Date  . ABDOMINAL HYSTERECTOMY    . TOTAL HIP ARTHROPLASTY       OB History   No obstetric history on file.      Home Medications    Prior to Admission medications   Medication Sig Start Date End Date Taking? Authorizing Provider  aspirin EC 325 MG tablet Take 1 tablet (325 mg total) by mouth daily. 07/23/18   Pricilla Loveless, MD  Cholecalciferol (VITAMIN D) 2000 UNITS tablet Take 2,000 Units by mouth daily.      [provider]  diclofenac sodium (VOLTAREN) 1 % GEL Apply 4 g topically 4 (four) times daily. Apply to your left shoulder 11/14/17   Shaune Pollack, MD  diltiazem (CARDIZEM) 60 MG tablet Take 60 mg by mouth 2 (two) times daily.      [provider]  enalapril (VASOTEC) 10 MG tablet Take 10 mg by mouth daily. 04/08/14   [provider]  fesoterodine (TOVIAZ) 4 MG TB24 Take 4 mg by mouth every morning.    [provider]  gabapentin (NEURONTIN) 300 MG capsule Take 300 mg (1 capsule) twice daily the first day and then begin 300 mg (1 capsule) 3 times daily thereafter. 09/12/18   Kaniesha Barile, Waylan Boga, PA-C  hydrochlorothiazide (HYDRODIURIL) 25 MG tablet Take 25 mg by mouth daily.      [provider]  meclizine (ANTIVERT) 12.5 MG tablet Take 1 tablet (12.5 mg total) by mouth 3 (three) times daily as needed for dizziness. 07/23/18   Pricilla Loveless, MD  Menthol-Methyl Salicylate (MUSCLE RUB) 10-15 % CREA Apply 1 application topically as needed (for sore muscle).    [provider]  Multiple Vitamin (MULTIVITAMIN WITH MINERALS) TABS Take 1 tablet by mouth daily.    [provider]  oxyCODONE (ROXICODONE) 5 MG immediate release tablet Take 0.5 tablets (2.5 mg total) by mouth every 6 (six) hours as needed for severe pain. 11/14/17   Shaune Pollack, MD  Polyethyl Glycol-Propyl Glycol (SYSTANE OP) Place 1 drop into both eyes every  morning.    [provider]  simvastatin (ZOCOR) 40 MG tablet Take 40 mg by mouth at bedtime.      [provider]  solifenacin (VESICARE) 5 MG tablet Take 5 mg by mouth daily.    [provider]  traMADol (ULTRAM) 50 MG tablet Take 50 mg by mouth every 8 (eight) hours as needed for pain. Maximum dose= 8 tablets per day, for pain    [provider]    Family History No family history on file.  Social History Social History   Tobacco Use  . Smoking status: Never Smoker  . Smokeless tobacco: Never Used  Substance Use Topics  . Alcohol use: No  . Drug use: No     Allergies   Patient has no known allergies.   Review of  Systems Review of Systems  Constitutional: Negative for chills and fever.  HENT: Negative for facial swelling and sore throat.   Respiratory: Positive for cough (for 2 years). Negative for shortness of breath.   Cardiovascular: Negative for chest pain.  Gastrointestinal: Negative for abdominal pain, nausea and vomiting.  Genitourinary: Negative for dysuria.  Musculoskeletal: Positive for arthralgias. Negative for back pain and joint swelling.  Skin: Negative for rash and wound.  Neurological: Negative for headaches.  Psychiatric/Behavioral: The patient is not nervous/anxious.      Physical Exam Updated Vital Signs BP (!) 147/78   Pulse 84   Temp 100.1 F (37.8 C) (Rectal)   Resp 18   Ht 5' (1.524 m)   Wt 64.9 kg   SpO2 99%   BMI 27.93 kg/m   Physical Exam Vitals signs and nursing note reviewed.  Constitutional:      General: She is not in acute distress.    Appearance: She is well-developed. She is not diaphoretic.  HENT:     Head: Normocephalic and atraumatic.     Mouth/Throat:     Pharynx: No oropharyngeal exudate or posterior oropharyngeal erythema.  Eyes:     General: No scleral icterus.       Right eye: No discharge.        Left eye: No discharge.     Conjunctiva/sclera: Conjunctivae normal.     Pupils: Pupils are equal, round, and reactive to light.  Neck:     Musculoskeletal: Normal range of motion and neck supple.     Thyroid: No thyromegaly.  Cardiovascular:     Rate and Rhythm: Normal rate and regular rhythm.     Heart sounds: Normal heart sounds. No murmur. No friction rub. No gallop.   Pulmonary:     Effort: Pulmonary effort is normal. No respiratory distress.     Breath sounds: Normal breath sounds. No stridor. No wheezing or rales.  Abdominal:     General: Bowel sounds are normal. There is no distension.     Palpations: Abdomen is soft.     Tenderness: There is no abdominal tenderness. There is no guarding or rebound.  Musculoskeletal:      Comments: No midline cervical, thoracic, lumbar tenderness No significant tenderness on palpation of bilateral shoulders, hips, knees; no notable edema or erythema noted to any of the joints No warmth or erythema  Lymphadenopathy:     Cervical: No cervical adenopathy.  Skin:    General: Skin is warm and dry.     Coloration: Skin is not pale.     Findings: No rash.  Neurological:     Mental Status: She is alert.  Coordination: Coordination normal.      ED Treatments / Results  Labs (all labs ordered are listed, but only abnormal results are displayed) Labs Reviewed  BASIC METABOLIC PANEL - Abnormal; Notable for the following components:      Result Value   Creatinine, Ser 1.12 (*)    GFR calc non Af Amer 43 (*)    GFR calc Af Amer 50 (*)    All other components within normal limits  CBC WITH DIFFERENTIAL/PLATELET - Abnormal; Notable for the following components:   Hemoglobin 11.7 (*)    Abs Immature Granulocytes 0.13 (*)    All other components within normal limits  URINALYSIS, ROUTINE W REFLEX MICROSCOPIC - Abnormal; Notable for the following components:   APPearance HAZY (*)    Nitrite POSITIVE (*)    Bacteria, UA RARE (*)    All other components within normal limits  URINE CULTURE    EKG None  Radiology Dg Chest Portable 1 View  Result Date: 09/12/2018 CLINICAL DATA:  Pt states cough for 2 years with Fever today EXAM: PORTABLE CHEST 1 VIEW COMPARISON:  Chest x-ray dated 11/14/2017. FINDINGS: Heart size and mediastinal contours are stable. Lungs are clear. No acute appearing osseous abnormality. IMPRESSION: No active disease. No evidence of pneumonia or pulmonary edema. Electronically Signed   By: Franki Cabot M.D.   On: 09/12/2018 08:50    Procedures Procedures (including critical care time)  Medications Ordered in ED Medications  gabapentin (NEURONTIN) capsule 300 mg (300 mg Oral Given 09/12/18 1114)  fosfomycin (MONUROL) packet 3 g (3 g Oral Given 09/12/18  1331)     Initial Impression / Assessment and Plan / ED Course  I have reviewed the triage vital signs and the nursing notes.  Pertinent labs & imaging results that were available during my care of the patient were reviewed by me and considered in my medical decision making (see chart for details).        Patient presenting with acute on chronic pain.  There is no focal pain or tenderness.  Low suspicion of septic joint(s).  patient has been taking tramadol for a long time and states it has not really been working.  Labs are stable for the patient.  UA does show positive nitrites.  She denies any notable urinary symptoms, however she does have a low-grade temperature today at 100.2.  Urine culture sent.  Patient given single dose of fosfomycin.  Will add gabapentin and patient advised to follow-up with PCP for better pain management.  Gabapentin titration discussed, however short course prescribed for PCP follow-up.  Return precautions discussed.  Patient understands and agrees with plan.  Patient vital stable throughout ED course and discharged in satisfactory condition.  Patient also evaluated by my attending, Dr. Vanita Panda, who guided the patient's management and agrees with plan.  Final Clinical Impressions(s) / ED Diagnoses   Final diagnoses:  Polyarthralgia  Other chronic pain    ED Discharge Orders         Ordered    gabapentin (NEURONTIN) 300 MG capsule     09/12/18 1303           Frederica Kuster, PA-C 09/12/18 1446    Carmin Muskrat, MD 09/12/18 1559

## 2018-09-12 NOTE — ED Notes (Signed)
States takes Tramadol for pain, denies new cough or feeling like she has a fever.  Pleasant and alert.

## 2018-09-12 NOTE — Discharge Instructions (Addendum)
Begin taking gabapentin once daily.  Your doctor will need to titrate this medication up to the most effective dose.  Take the medication twice daily tomorrow (09/13/2018), and then 3 times daily thereafter.  Hey may also want to change your tramadol to a different medication.  Take Tylenol as prescribed to the counter, as needed for pain.  You have also been treated for your urinary tract infection today.  If your urine culture requires an additional antibiotic, you will be called.  Please return to the emergency department if you develop any new or worsening symptoms.

## 2018-09-14 LAB — URINE CULTURE: Culture: >=100000 — AB

## 2018-09-15 ENCOUNTER — Telehealth: Payer: Self-pay | Admitting: Emergency Medicine

## 2018-09-15 NOTE — Telephone Encounter (Signed)
Post ED Visit - Positive Culture Follow-up  Culture report reviewed by antimicrobial stewardship pharmacist: [place] Team []  Elenor Quinones, Pharm.D. []  Heide Guile, Pharm.D., BCPS AQ-ID []  Parks Neptune, Pharm.D., BCPS []  Alycia Rossetti, Pharm.D., BCPS []  Russell, Pharm.D., BCPS, AAHIVP []  Legrand Como, Pharm.D., BCPS, AAHIVP []  Salome Arnt, PharmD, BCPS []  Johnnette Gourd, PharmD, BCPS [x]  Hughes Better, PharmD, BCPS []  Leeroy Cha, PharmD []  Laqueta Linden, PharmD, BCPS []  Albertina Parr, PharmD  [place] Team []  Leodis Sias, PharmD []  Lindell Spar, PharmD []  Royetta Asal, PharmD []  Graylin Shiver, Rph []  Rema Fendt) Glennon Mac, PharmD []  Arlyn Dunning, PharmD []  Netta Cedars, PharmD []  Dia Sitter, PharmD []  Leone Haven, PharmD []  Gretta Arab, PharmD []  Theodis Shove, PharmD []  Peggyann Juba, PharmD []  Reuel Boom, PharmD   Positive urine culture Treated with Fosfomycin, organism sensitive to the same and no further patient follow-up is required at this time.  Larene Beach Shadee Rathod 09/15/2018, 9:02 AM

## 2018-09-18 ENCOUNTER — Other Ambulatory Visit: Payer: Self-pay

## 2018-09-18 ENCOUNTER — Observation Stay (HOSPITAL_COMMUNITY): Payer: Medicare Other

## 2018-09-18 ENCOUNTER — Observation Stay (HOSPITAL_COMMUNITY)
Admission: EM | Admit: 2018-09-18 | Discharge: 2018-09-19 | Disposition: A | Discharge status: Home Health | Payer: Medicare Other | Attending: Family Medicine | Admitting: Family Medicine

## 2018-09-18 DIAGNOSIS — M6281 Muscle weakness (generalized): Secondary | ICD-10-CM | POA: Insufficient documentation

## 2018-09-18 DIAGNOSIS — A0811 Acute gastroenteropathy due to Norwalk agent: Secondary | ICD-10-CM | POA: Diagnosis present

## 2018-09-18 DIAGNOSIS — A058 Other specified bacterial foodborne intoxications: Secondary | ICD-10-CM | POA: Insufficient documentation

## 2018-09-18 DIAGNOSIS — N179 Acute kidney failure, unspecified: Secondary | ICD-10-CM | POA: Insufficient documentation

## 2018-09-18 DIAGNOSIS — E7800 Pure hypercholesterolemia, unspecified: Secondary | ICD-10-CM | POA: Diagnosis not present

## 2018-09-18 DIAGNOSIS — R531 Weakness: Secondary | ICD-10-CM

## 2018-09-18 DIAGNOSIS — M79642 Pain in left hand: Secondary | ICD-10-CM | POA: Insufficient documentation

## 2018-09-18 DIAGNOSIS — N390 Urinary tract infection, site not specified: Secondary | ICD-10-CM | POA: Insufficient documentation

## 2018-09-18 DIAGNOSIS — Z1159 Encounter for screening for other viral diseases: Secondary | ICD-10-CM | POA: Insufficient documentation

## 2018-09-18 DIAGNOSIS — M2550 Pain in unspecified joint: Secondary | ICD-10-CM

## 2018-09-18 DIAGNOSIS — I129 Hypertensive chronic kidney disease with stage 1 through stage 4 chronic kidney disease, or unspecified chronic kidney disease: Secondary | ICD-10-CM | POA: Insufficient documentation

## 2018-09-18 DIAGNOSIS — N189 Chronic kidney disease, unspecified: Secondary | ICD-10-CM | POA: Diagnosis present

## 2018-09-18 DIAGNOSIS — G8929 Other chronic pain: Secondary | ICD-10-CM | POA: Diagnosis not present

## 2018-09-18 DIAGNOSIS — M1990 Unspecified osteoarthritis, unspecified site: Secondary | ICD-10-CM | POA: Diagnosis not present

## 2018-09-18 DIAGNOSIS — Z791 Long term (current) use of non-steroidal anti-inflammatories (NSAID): Secondary | ICD-10-CM | POA: Diagnosis not present

## 2018-09-18 DIAGNOSIS — R197 Diarrhea, unspecified: Secondary | ICD-10-CM | POA: Diagnosis present

## 2018-09-18 DIAGNOSIS — M25562 Pain in left knee: Secondary | ICD-10-CM | POA: Insufficient documentation

## 2018-09-18 DIAGNOSIS — E860 Dehydration: Secondary | ICD-10-CM | POA: Diagnosis present

## 2018-09-18 DIAGNOSIS — A084 Viral intestinal infection, unspecified: Principal | ICD-10-CM | POA: Insufficient documentation

## 2018-09-18 DIAGNOSIS — Z79899 Other long term (current) drug therapy: Secondary | ICD-10-CM | POA: Diagnosis not present

## 2018-09-18 DIAGNOSIS — B998 Other infectious disease: Secondary | ICD-10-CM | POA: Diagnosis not present

## 2018-09-18 DIAGNOSIS — Z7982 Long term (current) use of aspirin: Secondary | ICD-10-CM | POA: Insufficient documentation

## 2018-09-18 DIAGNOSIS — M069 Rheumatoid arthritis, unspecified: Secondary | ICD-10-CM | POA: Insufficient documentation

## 2018-09-18 DIAGNOSIS — M1711 Unilateral primary osteoarthritis, right knee: Secondary | ICD-10-CM | POA: Diagnosis not present

## 2018-09-18 DIAGNOSIS — M79641 Pain in right hand: Secondary | ICD-10-CM | POA: Diagnosis not present

## 2018-09-18 DIAGNOSIS — N183 Chronic kidney disease, stage 3 (moderate): Secondary | ICD-10-CM | POA: Diagnosis not present

## 2018-09-18 DIAGNOSIS — E785 Hyperlipidemia, unspecified: Secondary | ICD-10-CM | POA: Diagnosis not present

## 2018-09-18 LAB — COMPREHENSIVE METABOLIC PANEL
ALT: 31 U/L (ref 0–44)
AST: 33 U/L (ref 15–41)
Albumin: 2.8 g/dL — ABNORMAL LOW (ref 3.5–5.0)
Alkaline Phosphatase: 76 U/L (ref 38–126)
Anion gap: 11 (ref 5–15)
BUN: 29 mg/dL — ABNORMAL HIGH (ref 8–23)
CO2: 22 mmol/L (ref 22–32)
Calcium: 8.5 mg/dL — ABNORMAL LOW (ref 8.9–10.3)
Chloride: 103 mmol/L (ref 98–111)
Creatinine, Ser: 1.42 mg/dL — ABNORMAL HIGH (ref 0.44–1.00)
GFR calc Af Amer: 37 mL/min — ABNORMAL LOW (ref >60)
GFR calc non Af Amer: 32 mL/min — ABNORMAL LOW (ref >60)
Glucose, Bld: 104 mg/dL — ABNORMAL HIGH (ref 70–99)
Potassium: 4.3 mmol/L (ref 3.5–5.1)
Sodium: 136 mmol/L (ref 135–145)
Total Bilirubin: 0.4 mg/dL (ref 0.3–1.2)
Total Protein: 7 g/dL (ref 6.5–8.1)

## 2018-09-18 LAB — CBC WITH DIFFERENTIAL/PLATELET
Abs Immature Granulocytes: 0.07 10*3/uL (ref 0.00–0.07)
Basophils Absolute: 0 10*3/uL (ref 0.0–0.1)
Basophils Relative: 0 %
Eosinophils Absolute: 0.2 10*3/uL (ref 0.0–0.5)
Eosinophils Relative: 3 %
HCT: 38.8 % (ref 36.0–46.0)
Hemoglobin: 11.8 g/dL — ABNORMAL LOW (ref 12.0–15.0)
Immature Granulocytes: 1 %
Lymphocytes Relative: 28 %
Lymphs Abs: 1.6 10*3/uL (ref 0.7–4.0)
MCH: 29.6 pg (ref 26.0–34.0)
MCHC: 30.4 g/dL (ref 30.0–36.0)
MCV: 97.2 fL (ref 80.0–100.0)
Monocytes Absolute: 0.6 10*3/uL (ref 0.1–1.0)
Monocytes Relative: 10 %
Neutro Abs: 3.3 10*3/uL (ref 1.7–7.7)
Neutrophils Relative %: 58 %
Platelets: 337 10*3/uL (ref 150–400)
RBC: 3.99 MIL/uL (ref 3.87–5.11)
RDW: 13.8 % (ref 11.5–15.5)
WBC: 5.7 10*3/uL (ref 4.0–10.5)
nRBC: 0 % (ref 0.0–0.2)

## 2018-09-18 LAB — C-REACTIVE PROTEIN: CRP: 7.1 mg/dL — ABNORMAL HIGH (ref <1.0)

## 2018-09-18 LAB — C DIFFICILE QUICK SCREEN W PCR REFLEX
C Diff antigen: NEGATIVE
C Diff interpretation: NOT DETECTED
C Diff toxin: NEGATIVE

## 2018-09-18 LAB — SARS CORONAVIRUS 2 BY RT PCR (HOSPITAL ORDER, PERFORMED IN ~~LOC~~ HOSPITAL LAB): SARS Coronavirus 2: NEGATIVE

## 2018-09-18 LAB — SEDIMENTATION RATE: Sed Rate: 75 mm/hr — ABNORMAL HIGH (ref 0–22)

## 2018-09-18 MED ORDER — ACETAMINOPHEN 325 MG PO TABS: 650.0000 mg | ORAL_TABLET | Freq: Four times a day (QID) | ORAL | Status: DC | PRN

## 2018-09-18 MED ORDER — SODIUM CHLORIDE 0.9 % IV SOLN
INTRAVENOUS | Status: DC
  Administered 2018-09-18: 20:00:00 via INTRAVENOUS

## 2018-09-18 MED ORDER — ENOXAPARIN SODIUM 30 MG/0.3ML ~~LOC~~ SOLN
30.0000 mg | SUBCUTANEOUS | Status: DC
  Administered 2018-09-18: 12:00:00 30 mg via SUBCUTANEOUS
  Filled 2018-09-18 (×2): qty 0.3

## 2018-09-18 MED ORDER — SODIUM CHLORIDE 0.9 % IV SOLN
Freq: Once | INTRAVENOUS | Status: AC
  Administered 2018-09-18: 09:00:00 via INTRAVENOUS

## 2018-09-18 MED ORDER — SODIUM CHLORIDE 0.9% FLUSH
3.0000 mL | Freq: Two times a day (BID) | INTRAVENOUS | Status: DC
  Administered 2018-09-18 – 2018-09-19 (×2): 3 mL via INTRAVENOUS

## 2018-09-18 MED ORDER — DICLOFENAC SODIUM 1 % TD GEL
2.0000 g | Freq: Four times a day (QID) | TRANSDERMAL | Status: DC
  Administered 2018-09-18 – 2018-09-19 (×3): 2 g via TOPICAL
  Filled 2018-09-18: qty 100

## 2018-09-18 MED ORDER — ONDANSETRON HCL 4 MG PO TABS: 4.0000 mg | ORAL_TABLET | Freq: Four times a day (QID) | ORAL | Status: DC | PRN

## 2018-09-18 MED ORDER — ONDANSETRON HCL 4 MG/2ML IJ SOLN: 4.0000 mg | Freq: Four times a day (QID) | INTRAMUSCULAR | Status: DC | PRN

## 2018-09-18 MED ORDER — ALBUTEROL SULFATE (2.5 MG/3ML) 0.083% IN NEBU: 2.5000 mg | INHALATION_SOLUTION | Freq: Four times a day (QID) | RESPIRATORY_TRACT | Status: DC | PRN

## 2018-09-18 MED ORDER — ACETAMINOPHEN 650 MG RE SUPP: 650.0000 mg | Freq: Four times a day (QID) | RECTAL | Status: DC | PRN

## 2018-09-18 NOTE — ED Provider Notes (Addendum)
[facility]   CSN: [id number] Arrival date & time: 09/18/18  0548    History   Chief Complaint Chief Complaint  Patient presents with  . Generalized Body Aches  . Pain Control    HPI Robin Moreno is a 85 y.o. female.     HPI Patient returns after being seen 6 days ago with generalized aching and treated for UTI with fosfomycin.  Urine culture was a pansensitive E. coli.  Patient reports that she continues to have a lot of pain in all of her joints, both shoulders, wrists, legs.  She reports she has history of rheumatoid arthritis.  Patient also is having diarrhea.  Reports that it had improved but returned again several days ago.  She is having voluminous watery brown stool.  She denies abdominal pain or chest pain.  She denies vomiting.  Phone conversation with the patient's son.  He lives with the patient in her home and helps her with all of her activities.  He takes care of her on a daily basis.  Reports over the past 2 days she is gotten significantly increased weakness for basic walking and activities.  Usually, she can use her walker and coming to the kitchen and sit down and fix herself a plate of food or go to the bathroom independently.  Over the past 2 days, she is required full assistance to be able to move about.  He reports that she has been having some diarrhea intermittently.  First episode started about over a week ago and he thought it was due to some food she had eaten.  She was up all night with recurrent diarrheal stool.  Got her some OTC diarrheal medication and it seemed to help.  He reports however it seemed to come back and she had copious stool this morning.  She had called EMS on her alert button because of pain. Past Medical History:  Diagnosis Date  . Arthritis   . Degenerative disk disease   . Hypercholesterolemia   . Hypertension   . Osteoarthritis of right knee     There are no active problems to  display for this patient.   Past Surgical History:  Procedure Laterality Date  . ABDOMINAL HYSTERECTOMY    . TOTAL HIP ARTHROPLASTY       OB History   No obstetric history on file.      Home Medications    Prior to Admission medications   Medication Sig Start Date End Date Taking? Authorizing Provider  aspirin EC 325 MG tablet Take 1 tablet (325 mg total) by mouth daily. 07/23/18   Pricilla LovelessGoldston, Scott, MD  Cholecalciferol (VITAMIN D) 2000 UNITS tablet Take 2,000 Units by mouth daily.      [provider]  diclofenac sodium (VOLTAREN) 1 % GEL Apply 4 g topically 4 (four) times daily. Apply to your left shoulder 11/14/17   Shaune PollackIsaacs, Cameron, MD  diltiazem (CARDIZEM) 60 MG tablet Take 60 mg by mouth 2 (two) times daily.      [provider]  enalapril (VASOTEC) 10 MG tablet Take 10 mg by mouth daily. 04/08/14   [provider]  fesoterodine (TOVIAZ) 4 MG TB24 Take 4 mg by mouth every morning.    [provider]  gabapentin (NEURONTIN) 300 MG capsule Take 300 mg (1 capsule) twice daily the first day and then begin 300 mg (1 capsule) 3 times daily thereafter. 09/12/18   Law, Waylan BogaAlexandra M, PA-C  hydrochlorothiazide (  HYDRODIURIL) 25 MG tablet Take 25 mg by mouth daily.      [provider]  meclizine (ANTIVERT) 12.5 MG tablet Take 1 tablet (12.5 mg total) by mouth 3 (three) times daily as needed for dizziness. 07/23/18   Sherwood Gambler, MD  Menthol-Methyl Salicylate (MUSCLE RUB) 10-15 % CREA Apply 1 application topically as needed (for sore muscle).    [provider]  Multiple Vitamin (MULTIVITAMIN WITH MINERALS) TABS Take 1 tablet by mouth daily.    [provider]  oxyCODONE (ROXICODONE) 5 MG immediate release tablet Take 0.5 tablets (2.5 mg total) by mouth every 6 (six) hours as needed for severe pain. 11/14/17   Duffy Bruce, MD  Polyethyl Glycol-Propyl Glycol (SYSTANE OP) Place 1 drop into both eyes every morning.    [provider]  simvastatin (ZOCOR) 40 MG tablet Take 40 mg by mouth at bedtime.      [provider]  solifenacin (VESICARE) 5 MG tablet Take 5 mg by mouth daily.    [provider]  traMADol (ULTRAM) 50 MG tablet Take 50 mg by mouth every 8 (eight) hours as needed for pain. Maximum dose= 8 tablets per day, for pain    [provider]    Family History No family history on file.  Social History Social History   Tobacco Use  . Smoking status: Never Smoker  . Smokeless tobacco: Never Used  Substance Use Topics  . Alcohol use: No  . Drug use: No     Allergies   Patient has no known allergies.   Review of Systems Review of Systems 10 Systems reviewed and are negative for acute change except as noted in the HPI.   Physical Exam Updated Vital Signs BP 124/75   Pulse 82   Temp 98.3 F (36.8 C) (Oral)   Resp (!) 22   SpO2 97%   Physical Exam Constitutional:      Comments: Patient is alert.  She shows no signs of respiratory distress.  She is brought to the room in a wheelchair.  Patient has central obesity and decreased mobility.  She can assist in a stand transfer to the bed but requires much assistance.  She has had voluminous liquid brown stool that have soaked her clothes.  HENT:     Head: Normocephalic and atraumatic.     Mouth/Throat:     Mouth: Mucous membranes are moist.     Pharynx: Oropharynx is clear.  Eyes:     Extraocular Movements: Extraocular movements intact.  Cardiovascular:     Pulses: Normal pulses.     Comments: Heart is regular.  2\6 systolic ejection murmur. Pulmonary:     Effort: Pulmonary effort is normal.     Breath sounds: Normal breath sounds.  Abdominal:     General: There is no distension.     Palpations: Abdomen is soft.     Tenderness: There is no abdominal tenderness. There is no guarding.  Musculoskeletal:     Comments: All extremities show arthritic changes.  Patient's hands have changes that suggest  osteoarthritis however she reports history of rheumatoid.  None of the joints are warm or appear to have soft tissue swelling.  She complains of pain at the shoulders bilaterally.  He has significant muscle loss over the shoulders and upper arms.  There is no asymmetry or apparent deformity.  She can use both arms to help in repositioning.  No hip deformities and patient is able to weight-bear for transfer without  expression of any significant pain of the lower extremities.  Neurological:     General: No focal deficit present.     Mental Status: She is oriented to person, place, and time.     Coordination: Coordination normal.     Comments: Patient is alert.  Her cognitive function appears to be intact.  Is situationally oriented.  speach is appropriate to the situation.  She appears to have mobility issues due to arthritis and deconditioning but no evident focal neurologic deficits.  Psychiatric:        Mood and Affect: Mood normal.      ED Treatments / Results  Labs (all labs ordered are listed, but only abnormal results are displayed) Labs Reviewed  COMPREHENSIVE METABOLIC PANEL - Abnormal; Notable for the following components:      Result Value   Glucose, Bld 104 (*)    BUN 29 (*)    Creatinine, Ser 1.42 (*)    Calcium 8.5 (*)    Albumin 2.8 (*)    GFR calc non Af Amer 32 (*)    GFR calc Af Amer 37 (*)    All other components within normal limits  CBC WITH DIFFERENTIAL/PLATELET - Abnormal; Notable for the following components:   Hemoglobin 11.8 (*)    All other components within normal limits  C DIFFICILE QUICK SCREEN W PCR REFLEX  SARS CORONAVIRUS 2 (HOSPITAL ORDER, PERFORMED IN [facility])  URINE CULTURE  GASTROINTESTINAL PANEL BY PCR, STOOL (REPLACES STOOL CULTURE)  URINALYSIS, ROUTINE W REFLEX MICROSCOPIC  SEDIMENTATION RATE  C-REACTIVE PROTEIN    EKG EKG Interpretation  Date/Time:  Monday September 18 2018 11:58:09 EDT Ventricular Rate:  71 PR  Interval:    QRS Duration: 115 QT Interval:  402 QTC Calculation: 437 R Axis:   16 Text Interpretation:  Sinus rhythm Prolonged PR interval Nonspecific intraventricular conduction delay Borderline T abnormalities, inferior leads no sig change from previous Confirmed by Arby Barrette [phone]) on 09/18/2018 12:16:25 PM   Radiology No results found.  Procedures Procedures (including critical care time)  Medications Ordered in ED Medications  0.9 %  sodium chloride infusion ( Intravenous New Bag/Given 09/18/18 0914)     Initial Impression / Assessment and Plan / ED Course  I have reviewed the triage vital signs and the nursing notes.  Pertinent labs & imaging results that were available during my care of the patient were reviewed by me and considered in my medical decision making (see chart for details).    Patient presents with increasing weakness and failure of mobility.  He is having copious liquid brown stool.  Possibly C. difficile.  This is been occurring intermittently for a week.  Her son reports that seemed to respond somewhat to OTC antidiarrheal agent.  Patient is mildly dehydrated on basic metabolic panel.  A has been treated for a pansensitive E. coli UTI with fosfomycin about 6 days ago.  Need a repeat urine will have to get a cath specimen.  Plan to admit for weakness with dehydration and diarrhea.  Stool studies have been obtained.  1 of patient's primary concerns is pain which seems to be at multiple joints.  This seems to be chronic but is significantly bothering the patient.    Consult: Dr. Katrinka Blazing for admission.  Final Clinical Impressions(s) / ED Diagnoses   Final diagnoses:  Diarrhea of presumed infectious origin  Dehydration  Arthralgia, unspecified joint    ED Discharge Orders    None  Arby Barrette, MD 09/18/18 1119    Arby Barrette, MD 09/18/18 1216

## 2018-09-18 NOTE — ED Notes (Signed)
Pure Wick was placed on patient. 

## 2018-09-18 NOTE — H&P (Signed)
History and Physical    Robin Moreno OTL:[id number] DOB: Jul 30, 1925 DOA: 09/18/2018  Referring MD/NP/PA: Tomasa Rand, MD PCP: Jilda Panda, MD  Patient coming from: Home  Chief Complaint: Joint pain  I have personally briefly reviewed patient's old medical records in [place]   HPI: Robin Moreno is a 85 y.o. female with medical history significant of HTN, HLD, DDD, osteoarthritis, and per patient rheumatoid arthritis; who presents with complaints of joint pain.  She reports her joints are painful all over and it makes it hard for her to get up out of bed.  Patient was just seen in the emergency department 6 days ago for the same where she was found to have a pansensitive E. coli urinary tract infection treated with fosfomycin.  Patient is an adequate historian.  She reports that after eating [employer] D's she had nausea, vomiting, and diarrhea that she attributes to the coleslaw.  She reports only having one episode of emesis, but diarrhea has persisted.  Son it given her over-the-counter [facility] medicine to help stop diarrhea.  The medication helped initially, but symptoms returned this morning.  At baseline the patient lives at home with the son and is normally able to ambulate with use of a rolling walker.  However since his onset of symptoms has had difficulty even getting out of bed without assistance.  ED Course: Upon admission into the emergency department patient was noted to be afebrile, respirations 17-23, and all other vital signs maintained.  Labs revealed WBC 5.7, hemoglobin 11.8, BUN 29, creatinine 1.42, calcium 8.5, and albumin 2.8.  C. Diff negative. COVID-19 screening negative.  Patient was given 250 mL of normal saline IV fluids in the ED.  TRH called to admit.  Review of Systems  Constitutional: Negative for chills and fever.  HENT: Negative for nosebleeds and sinus pain.   Eyes: Negative for photophobia and pain.  Respiratory: Negative for cough and shortness of  breath.   Cardiovascular: Negative for chest pain, claudication and leg swelling.  Gastrointestinal: Positive for diarrhea. Negative for abdominal pain.  Genitourinary: Negative for dysuria and hematuria.  Musculoskeletal: Positive for joint pain and myalgias. Negative for falls.  Skin: Negative for rash.  Neurological: Positive for weakness. Negative for focal weakness.  Psychiatric/Behavioral: Negative for memory loss and substance abuse.    Past Medical History:  Diagnosis Date  . Arthritis   . Degenerative disk disease   . Hypercholesterolemia   . Hypertension   . Osteoarthritis of right knee     Past Surgical History:  Procedure Laterality Date  . ABDOMINAL HYSTERECTOMY    . TOTAL HIP ARTHROPLASTY       reports that she has never smoked. She has never used smokeless tobacco. She reports that she does not drink alcohol or use drugs.  No Known Allergies  Family history significant for elevated blood pressure  Prior to Admission medications   Medication Sig Start Date End Date Taking? Authorizing Provider  aspirin EC 325 MG tablet Take 1 tablet (325 mg total) by mouth daily. 07/23/18   Sherwood Gambler, MD  Cholecalciferol (VITAMIN D) 2000 UNITS tablet Take 2,000 Units by mouth daily.      [provider]  diclofenac sodium (VOLTAREN) 1 % GEL Apply 4 g topically 4 (four) times daily. Apply to your left shoulder 11/14/17   Duffy Bruce, MD  diltiazem (CARDIZEM) 60 MG tablet Take 60 mg by mouth 2 (two) times daily.      [provider]  enalapril (VASOTEC) 10 MG tablet Take 10 mg by mouth daily. 04/08/14   [provider]  fesoterodine (TOVIAZ) 4 MG TB24 Take 4 mg by mouth every morning.    [provider]  gabapentin (NEURONTIN) 300 MG capsule Take 300 mg (1 capsule) twice daily the first day and then begin 300 mg (1 capsule) 3 times daily thereafter. 09/12/18   Law, Bea Graff, PA-C  hydrochlorothiazide (HYDRODIURIL) 25 MG tablet Take 25 mg  by mouth daily.      [provider]  meclizine (ANTIVERT) 12.5 MG tablet Take 1 tablet (12.5 mg total) by mouth 3 (three) times daily as needed for dizziness. 07/23/18   Sherwood Gambler, MD  Menthol-Methyl Salicylate (MUSCLE RUB) 10-15 % CREA Apply 1 application topically as needed (for sore muscle).    [provider]  Multiple Vitamin (MULTIVITAMIN WITH MINERALS) TABS Take 1 tablet by mouth daily.    [provider]  oxyCODONE (ROXICODONE) 5 MG immediate release tablet Take 0.5 tablets (2.5 mg total) by mouth every 6 (six) hours as needed for severe pain. 11/14/17   Duffy Bruce, MD  Polyethyl Glycol-Propyl Glycol (SYSTANE OP) Place 1 drop into both eyes every morning.    [provider]  simvastatin (ZOCOR) 40 MG tablet Take 40 mg by mouth at bedtime.      [provider]  solifenacin (VESICARE) 5 MG tablet Take 5 mg by mouth daily.    [provider]  traMADol (ULTRAM) 50 MG tablet Take 50 mg by mouth every 8 (eight) hours as needed for pain. Maximum dose= 8 tablets per day, for pain    [provider]    Physical Exam:  Constitutional: Elderly female in NAD, calm, comfortable Vitals:   09/18/18 0915 09/18/18 0930 09/18/18 0945 09/18/18 1000  BP: (!) 142/85 (!) 142/84 (!) 125/93 124/75  Pulse: 70 71  82  Resp: 17 (!) 23 17 (!) 22  Temp:      TempSrc:      SpO2: 99% 96% 98% 97%   Eyes: PERRL, lids and conjunctivae normal ENMT: Mucous membranes are  dry. Posterior pharynx clear of any exudate or lesions.  Neck: normal, supple, no masses, no thyromegaly Respiratory: clear to auscultation bilaterally, no wheezing, no crackles. Normal respiratory effort. No accessory muscle use.  Cardiovascular: Regular rate and rhythm, no murmurs / rubs / gallops. No extremity edema. 2+ pedal pulses. No carotid bruits.  Abdomen: no tenderness, no masses palpated. No hepatosplenomegaly. Bowel sounds positive.  Musculoskeletal: no clubbing /  cyanosis.  Joint changes noted of the upper and lower extremities. Skin: no rashes, lesions, ulcers. No induration Neurologic: CN 2-12 grossly intact. Sensation intact, DTR normal. Strength 5/5 in all 4.  Psychiatric: Normal judgment and insight. Alert and oriented x 3. Normal mood.     Labs on Admission: I have personally reviewed following labs and imaging studies  CBC: Recent Labs  Lab 09/12/18 0812 09/18/18 0555  WBC 9.0 5.7  NEUTROABS 5.9 3.3  HGB 11.7* 11.8*  HCT 38.5 38.8  MCV 96.3 97.2  PLT 314 037   Basic Metabolic Panel: Recent Labs  Lab 09/12/18 0812 09/18/18 0555  NA 138 136  K 5.0 4.3  CL 104 103  CO2 24 22  GLUCOSE 91 104*  BUN 19 29*  CREATININE 1.12* 1.42*  CALCIUM 9.1 8.5*   GFR: Estimated Creatinine Clearance: 21.7 mL/min (A) (by C-G formula based on SCr of 1.42 mg/dL (H)). Liver Function Tests: Recent Labs  Lab  09/18/18 0555  AST 33  ALT 31  ALKPHOS 76  BILITOT 0.4  PROT 7.0  ALBUMIN 2.8*   No results for input(s): LIPASE, AMYLASE in the last 168 hours. No results for input(s): AMMONIA in the last 168 hours. Coagulation Profile: No results for input(s): INR, PROTIME in the last 168 hours. Cardiac Enzymes: No results for input(s): CKTOTAL, CKMB, CKMBINDEX, TROPONINI in the last 168 hours. BNP (last 3 results) No results for input(s): PROBNP in the last 8760 hours. HbA1C: No results for input(s): HGBA1C in the last 72 hours. CBG: No results for input(s): GLUCAP in the last 168 hours. Lipid Profile: No results for input(s): CHOL, HDL, LDLCALC, TRIG, CHOLHDL, LDLDIRECT in the last 72 hours. Thyroid Function Tests: No results for input(s): TSH, T4TOTAL, FREET4, T3FREE, THYROIDAB in the last 72 hours. Anemia Panel: No results for input(s): VITAMINB12, FOLATE, FERRITIN, TIBC, IRON, RETICCTPCT in the last 72 hours. Urine analysis:    Component Value Date/Time   COLORURINE YELLOW 09/12/2018 1157   APPEARANCEUR HAZY (A) 09/12/2018 1157    LABSPEC 1.015 09/12/2018 1157   PHURINE 5.0 09/12/2018 1157   GLUCOSEU NEGATIVE 09/12/2018 1157   HGBUR NEGATIVE 09/12/2018 1157   BILIRUBINUR NEGATIVE 09/12/2018 1157   KETONESUR NEGATIVE 09/12/2018 1157   PROTEINUR NEGATIVE 09/12/2018 1157   NITRITE POSITIVE (A) 09/12/2018 1157   LEUKOCYTESUR NEGATIVE 09/12/2018 1157   Sepsis Labs: Recent Results (from the past 240 hour(s))  Urine culture     Status: Abnormal   Collection Time: 09/12/18 12:46 PM   Specimen: Urine, Random  Result Value Ref Range Status   Specimen Description URINE, RANDOM  Final   Special Requests   Final    NONE Performed at [facility], [place] [address], [place], [place] [address]    Culture >=100,000 COLONIES/mL ESCHERICHIA COLI (A)  Final   Report Status 09/14/2018 FINAL  Final   Organism ID, Bacteria ESCHERICHIA COLI (A)  Final      Susceptibility   Escherichia coli - MIC*    AMPICILLIN <=2 SENSITIVE Sensitive     CEFAZOLIN <=4 SENSITIVE Sensitive     CEFTRIAXONE <=1 SENSITIVE Sensitive     CIPROFLOXACIN <=0.25 SENSITIVE Sensitive     GENTAMICIN <=1 SENSITIVE Sensitive     IMIPENEM <=0.25 SENSITIVE Sensitive     NITROFURANTOIN <=16 SENSITIVE Sensitive     TRIMETH/SULFA <=20 SENSITIVE Sensitive     AMPICILLIN/SULBACTAM <=2 SENSITIVE Sensitive     PIP/TAZO <=4 SENSITIVE Sensitive     Extended ESBL NEGATIVE Sensitive     * >=100,000 COLONIES/mL ESCHERICHIA COLI  C difficile quick scan w PCR reflex     Status: None   Collection Time: 09/18/18  8:25 AM   Specimen: STOOL  Result Value Ref Range Status   C Diff antigen NEGATIVE NEGATIVE Final   C Diff toxin NEGATIVE NEGATIVE Final   C Diff interpretation No C. difficile detected.  Final    Comment: Performed at [facility], [place] [address], [place], [place] [address]  SARS Coronavirus 2 (CEPHEID- Performed in [facility]), Hosp Order     Status: None   Collection Time: 09/18/18  8:36 AM   Specimen: Nasopharyngeal Swab  Result  Value Ref Range Status   SARS Coronavirus 2 NEGATIVE NEGATIVE Final    Comment: (NOTE) If result is NEGATIVE SARS-CoV-2 target nucleic acids are NOT DETECTED. The SARS-CoV-2 RNA is generally detectable in upper and lower  respiratory specimens during the acute phase of infection.  The lowest  concentration of SARS-CoV-2 viral copies this assay can detect is 250  copies / mL. A negative result does not preclude SARS-CoV-2 infection  and should not be used as the sole basis for treatment or other  patient management decisions.  A negative result may occur with  improper specimen collection / handling, submission of specimen other  than nasopharyngeal swab, presence of viral mutation(s) within the  areas targeted by this assay, and inadequate number of viral copies  (<250 copies / mL). A negative result must be combined with clinical  observations, patient history, and epidemiological information. If result is POSITIVE SARS-CoV-2 target nucleic acids are DETECTED. The SARS-CoV-2 RNA is generally detectable in upper and lower  respiratory specimens dur ing the acute phase of infection.  Positive  results are indicative of active infection with SARS-CoV-2.  Clinical  correlation with patient history and other diagnostic information is  necessary to determine patient infection status.  Positive results do  not rule out bacterial infection or co-infection with other viruses. If result is PRESUMPTIVE POSTIVE SARS-CoV-2 nucleic acids MAY BE PRESENT.   A presumptive positive result was obtained on the submitted specimen  and confirmed on repeat testing.  While 2019 novel coronavirus  (SARS-CoV-2) nucleic acids may be present in the submitted sample  additional confirmatory testing may be necessary for epidemiological  and / or clinical management purposes  to differentiate between  SARS-CoV-2 and other Sarbecovirus currently known to infect humans.  If clinically indicated additional testing  with an alternate test  methodology [phone]) is advised. The SARS-CoV-2 RNA is generally  detectable in upper and lower respiratory sp ecimens during the acute  phase of infection. The expected result is Negative. Fact Sheet for Patients:  StrictlyIdeas.no Fact Sheet for Healthcare Providers: BankingDealers.co.za This test is not yet approved or cleared by the [place] FDA and has been authorized for detection and/or diagnosis of SARS-CoV-2 by FDA under an Emergency Use Authorization (EUA).  This EUA will remain in effect (meaning this test can be used) for the duration of the COVID-19 declaration under Section 564(b)(1) of the Act, 21 U.S.C. section 360bbb-3(b)(1), unless the authorization is terminated or revoked sooner. Performed at [facility], [place] [address], [place], [place] [address]      Radiological Exams on Admission: No results found.  EKG: Independently reviewed.  Sinus rhythm at 71 bpm  Assessment/Plan Diarrhea: Acute.  Patient with reports continued diarrhea.  She required symptoms possibly to [employer] coleslaw that she had.  Recently patient was also given antibiotics for urinary tract infection.  C. difficile screen negative.  Suspect symptoms likely related to possible food poisoning. -Admit to a MedSurg bed -Monitor intake and output -Follow-up gastrointestinal panel -Check acute abdominal series with chest x-ray -Normal saline IV fluids at 75 mL/h  Acute kidney injury superimposed on chronic kidney disease stage III: Patient presents with creatinine 1.42 with BUN 29.  In the setting of course of diarrhea suspect prerenal cause of symptoms. -IV fluids as seen above -Recheck BMP in a.m.  Recent urinary tract infection: Patient was diagnosed with a urinary tract infection 6 days ago and was treated with fosfomycin.  Urine culture positive for pansensitive E. coli. -Follow-up repeat  urinalysis  Generalized weakness: Patient noted to be very weak normally ambulating with use of a walker.  Suspect secondary to dehydration. -PT/OT to evaluate and treat  Arthritis: Patient reports history of rheumatoid arthritis and diffuse joint pain. -Follow-up ESR and CRP -  Voltaren gel to affected joints   DVT prophylaxis: Lovenox Code Status: Full Family Communication:  Discussed plan of care with patient's son over the phone Disposition Plan: Likely discharge home in a.m. Consults called: None Admission status: Observation  Norval Morton MD [facility] Pager [phone]   If 7PM-7AM, please contact night-coverage [email or web address] Password [facility]  09/18/2018, 10:59 AM

## 2018-09-18 NOTE — ED Triage Notes (Addendum)
Patient c/o pain all over. Was seen here recently for same. States she is unable to sleep due to pain. Lives at home with son.

## 2018-09-18 NOTE — ED Notes (Signed)
RN navigator made contact with patient's son, given update that patient has bed on 6C.

## 2018-09-19 DIAGNOSIS — A084 Viral intestinal infection, unspecified: Secondary | ICD-10-CM | POA: Diagnosis not present

## 2018-09-19 DIAGNOSIS — N189 Chronic kidney disease, unspecified: Secondary | ICD-10-CM

## 2018-09-19 DIAGNOSIS — A0811 Acute gastroenteropathy due to Norwalk agent: Secondary | ICD-10-CM | POA: Diagnosis not present

## 2018-09-19 DIAGNOSIS — R197 Diarrhea, unspecified: Secondary | ICD-10-CM

## 2018-09-19 DIAGNOSIS — N179 Acute kidney failure, unspecified: Secondary | ICD-10-CM | POA: Diagnosis not present

## 2018-09-19 LAB — GASTROINTESTINAL PANEL BY PCR, STOOL (REPLACES STOOL CULTURE)

## 2018-09-19 LAB — CBC
HCT: 33.6 % — ABNORMAL LOW (ref 36.0–46.0)
Hemoglobin: 10.4 g/dL — ABNORMAL LOW (ref 12.0–15.0)
MCH: 29.1 pg (ref 26.0–34.0)
MCHC: 31 g/dL (ref 30.0–36.0)
MCV: 93.9 fL (ref 80.0–100.0)
Platelets: 293 10*3/uL (ref 150–400)
RBC: 3.58 MIL/uL — ABNORMAL LOW (ref 3.87–5.11)
RDW: 13.5 % (ref 11.5–15.5)
WBC: 6.1 10*3/uL (ref 4.0–10.5)
nRBC: 0 % (ref 0.0–0.2)

## 2018-09-19 LAB — BASIC METABOLIC PANEL
Anion gap: 6 (ref 5–15)
BUN: 20 mg/dL (ref 8–23)
CO2: 23 mmol/L (ref 22–32)
Calcium: 7.8 mg/dL — ABNORMAL LOW (ref 8.9–10.3)
Chloride: 109 mmol/L (ref 98–111)
Creatinine, Ser: 1.07 mg/dL — ABNORMAL HIGH (ref 0.44–1.00)
GFR calc Af Amer: 53 mL/min — ABNORMAL LOW (ref >60)
GFR calc non Af Amer: 45 mL/min — ABNORMAL LOW (ref >60)
Glucose, Bld: 120 mg/dL — ABNORMAL HIGH (ref 70–99)
Potassium: 4.2 mmol/L (ref 3.5–5.1)
Sodium: 138 mmol/L (ref 135–145)

## 2018-09-19 NOTE — Discharge Summary (Signed)
Physician Discharge Summary  TOSHIBA HEIT MBE:[id number] DOB: 1925/09/10 DOA: 09/18/2018  PCP: Ralene Ok, MD  Admit date: 09/18/2018 Discharge date: 09/19/2018  Admitted From: home Disposition:  home  Recommendations for Outpatient Follow-up:  1. Follow up with PCP in 1-2 weeks 2. Please obtain BMP/CBC in one week 3. Please follow up on the following pending results:  Home Health:none  Equipment/Devices:no   Discharge Condition: Stable CODE STATUS: Full code Diet recommendation: Regular  Subjective: Patient seen and examined.  She continues to complain of her chronic bilateral hand pain and knee pain.  No other complaint.  Diarrhea has resolved.  She is tolerating regular diet.  Brief/Interim Summary: IDELL Moreno is a 85 y.o. female with medical history significant of HTN, HLD, DDD, osteoarthritis, and per patient rheumatoid arthritis; who presented with complaints of joint pain, nausea, vomiting and diarrhea.  Upon presentation to the emergency department, she was hemodynamically stable.  Basic lab work was unremarkable including CT which was negative as well as COVID-19 was negative.  She was admitted under hospitalist service for observation and dehydration as well as acute on chronic kidney disease stage III.  She was started on IV fluids.  Enteric pathogen was tested and she was tested positive for sapovirus.  This is likely due to the coleslaw that she ate at the restaurant yesterday.  She is feeling much better today, diarrhea is resolved.  Dehydration has resolved.  Her renal function is back to her baseline so she will be discharged back to home and resume her home medications.  I have called and informed patient son over the phone and answered the questions he had.  He had no further question for me.  Patient is agreeable with the plan as well.  Discharge Diagnoses:  Principal Problem:   Diarrhea Active Problems:   Acute kidney injury superimposed on CKD (HCC)   Generalized  weakness   Arthritis   Viral gastroenteritis due to Sapporo agent    Discharge Instructions  Discharge Instructions    Discharge patient   Complete by: As directed    Discharge disposition: 01-Home or Self Care   Discharge patient date: 09/19/2018     Allergies as of 09/19/2018   No Known Allergies     Medication List    TAKE these medications   aspirin EC 325 MG tablet Take 1 tablet (325 mg total) by mouth daily.   diclofenac sodium 1 % Gel Commonly known as: VOLTAREN Apply 4 g topically 4 (four) times daily. Apply to your left shoulder   diltiazem 60 MG tablet Commonly known as: CARDIZEM Take 60 mg by mouth 2 (two) times daily.   enalapril 10 MG tablet Commonly known as: VASOTEC Take 10 mg by mouth daily.   gabapentin 300 MG capsule Commonly known as: Neurontin Take 300 mg (1 capsule) twice daily the first day and then begin 300 mg (1 capsule) 3 times daily thereafter. What changed:   how much to take  how to take this  when to take this   hydrochlorothiazide 25 MG tablet Commonly known as: HYDRODIURIL Take 25 mg by mouth daily.   meclizine 12.5 MG tablet Commonly known as: ANTIVERT Take 1 tablet (12.5 mg total) by mouth 3 (three) times daily as needed for dizziness.   multivitamin with minerals Tabs tablet Take 1 tablet by mouth daily.   Muscle Rub 10-15 % Crea Apply 1 application topically as needed (for sore muscle).   oxyCODONE 5 MG immediate release tablet Commonly  known as: Roxicodone Take 0.5 tablets (2.5 mg total) by mouth every 6 (six) hours as needed for severe pain.   rosuvastatin 10 MG tablet Commonly known as: CRESTOR Take 10 mg by mouth daily.   SYSTANE OP Place 1 drop into both eyes every morning.   Vitamin D 50 MCG (2000 UT) tablet Take 2,000 Units by mouth daily.      Follow-up Information    Ralene Ok, MD Follow up in 1 week(s).   Specialty: Internal Medicine Contact information: [address] [place]  [address] [phone]          No Known Allergies  Consultations: none   Procedures/Studies: Ct Angio Head W Or Wo Contrast  Result Date: 09/07/2018 CLINICAL DATA:  Follow-up of transient ischemic attacks and vertigo. Left ear hearing loss. EXAM: CT ANGIOGRAPHY HEAD AND NECK TECHNIQUE: Multidetector CT imaging of the head and neck was performed using the standard protocol during bolus administration of intravenous contrast. Multiplanar CT image reconstructions and MIPs were obtained to evaluate the vascular anatomy. Carotid stenosis measurements (when applicable) are obtained utilizing NASCET criteria, using the distal internal carotid diameter as the denominator. CONTRAST:  75mL ISOVUE-370 IOPAMIDOL (ISOVUE-370) INJECTION 76% COMPARISON:  Head CT 07/23/2018 FINDINGS: CT HEAD FINDINGS Brain: There is no mass, hemorrhage or extra-axial collection. There is generalized atrophy without lobar predilection. There is no acute or chronic infarction. There is hypoattenuation of the periventricular white matter, most commonly indicating chronic ischemic microangiopathy. Skull: The visualized skull base, calvarium and extracranial soft tissues are normal. Sinuses/Orbits: No fluid levels or advanced mucosal thickening of the visualized paranasal sinuses. No mastoid or middle ear effusion. The orbits are normal. CTA NECK FINDINGS SKELETON: Multilevel degenerative disc disease and facet hypertrophy without bony spinal canal stenosis. OTHER NECK: Normal pharynx, larynx and major salivary glands. No cervical lymphadenopathy. Unremarkable thyroid gland. UPPER CHEST: No pneumothorax or pleural effusion. No nodules or masses. AORTIC ARCH: There is no calcific atherosclerosis of the aortic arch. There is no aneurysm, dissection or hemodynamically significant stenosis of the visualized ascending aorta and aortic arch. Conventional 3 vessel aortic branching pattern. The visualized proximal subclavian arteries are widely  patent. RIGHT CAROTID SYSTEM: --Common carotid artery: Widely patent origin without common carotid artery dissection or aneurysm. --Internal carotid artery: No dissection, occlusion or aneurysm. There is predominantly calcified atherosclerosis extending into the proximal ICA, resulting in approximately 25% stenosis. --External carotid artery: No acute abnormality. LEFT CAROTID SYSTEM: --Common carotid artery: Widely patent origin without common carotid artery dissection or aneurysm. --Internal carotid artery: No dissection, occlusion or aneurysm. Mild atherosclerotic calcification at the carotid bifurcation without hemodynamically significant stenosis. --External carotid artery: No acute abnormality. VERTEBRAL ARTERIES: Codominant configuration. Both origins are clearly patent. No dissection, occlusion or flow-limiting stenosis to the skull base (V1-V3 segments). CTA HEAD FINDINGS POSTERIOR CIRCULATION: --Vertebral arteries: Normal V4 segments. --Posterior inferior cerebellar arteries (PICA): Patent origins from the vertebral arteries. --Anterior inferior cerebellar arteries (AICA): Patent origins from the basilar artery. --Basilar artery: Normal. --Superior cerebellar arteries: Normal. --Posterior cerebral arteries (PCA): Normal. Both originate from the basilar artery. Posterior communicating arteries (p-comm) are diminutive or absent. ANTERIOR CIRCULATION: --Intracranial internal carotid arteries: Atherosclerotic calcification of the internal carotid arteries at the skull base without hemodynamically significant stenosis. --Anterior cerebral arteries (ACA): Normal. Both A1 segments are present. Patent anterior communicating artery (a-comm). --Middle cerebral arteries (MCA): Normal. VENOUS SINUSES: As permitted by contrast timing, patent. ANATOMIC VARIANTS: None Review of the MIP images confirms the above findings. IMPRESSION: 1.  No emergent large vessel occlusion or hemodynamically significant stenosis by NASCET  criteria. 2. Mild bilateral carotid bifurcation calcific atherosclerosis with less than 25% stenosis. 3.  Aortic atherosclerosis (ICD10-I70.0). Electronically Signed   By: Ulyses Jarred M.D.   On: 09/07/2018 14:21   Ct Angio Neck W Or Wo Contrast  Result Date: 09/07/2018 CLINICAL DATA:  Follow-up of transient ischemic attacks and vertigo. Left ear hearing loss. EXAM: CT ANGIOGRAPHY HEAD AND NECK TECHNIQUE: Multidetector CT imaging of the head and neck was performed using the standard protocol during bolus administration of intravenous contrast. Multiplanar CT image reconstructions and MIPs were obtained to evaluate the vascular anatomy. Carotid stenosis measurements (when applicable) are obtained utilizing NASCET criteria, using the distal internal carotid diameter as the denominator. CONTRAST:  49mL ISOVUE-370 IOPAMIDOL (ISOVUE-370) INJECTION 76% COMPARISON:  Head CT 07/23/2018 FINDINGS: CT HEAD FINDINGS Brain: There is no mass, hemorrhage or extra-axial collection. There is generalized atrophy without lobar predilection. There is no acute or chronic infarction. There is hypoattenuation of the periventricular white matter, most commonly indicating chronic ischemic microangiopathy. Skull: The visualized skull base, calvarium and extracranial soft tissues are normal. Sinuses/Orbits: No fluid levels or advanced mucosal thickening of the visualized paranasal sinuses. No mastoid or middle ear effusion. The orbits are normal. CTA NECK FINDINGS SKELETON: Multilevel degenerative disc disease and facet hypertrophy without bony spinal canal stenosis. OTHER NECK: Normal pharynx, larynx and major salivary glands. No cervical lymphadenopathy. Unremarkable thyroid gland. UPPER CHEST: No pneumothorax or pleural effusion. No nodules or masses. AORTIC ARCH: There is no calcific atherosclerosis of the aortic arch. There is no aneurysm, dissection or hemodynamically significant stenosis of the visualized ascending aorta and  aortic arch. Conventional 3 vessel aortic branching pattern. The visualized proximal subclavian arteries are widely patent. RIGHT CAROTID SYSTEM: --Common carotid artery: Widely patent origin without common carotid artery dissection or aneurysm. --Internal carotid artery: No dissection, occlusion or aneurysm. There is predominantly calcified atherosclerosis extending into the proximal ICA, resulting in approximately 25% stenosis. --External carotid artery: No acute abnormality. LEFT CAROTID SYSTEM: --Common carotid artery: Widely patent origin without common carotid artery dissection or aneurysm. --Internal carotid artery: No dissection, occlusion or aneurysm. Mild atherosclerotic calcification at the carotid bifurcation without hemodynamically significant stenosis. --External carotid artery: No acute abnormality. VERTEBRAL ARTERIES: Codominant configuration. Both origins are clearly patent. No dissection, occlusion or flow-limiting stenosis to the skull base (V1-V3 segments). CTA HEAD FINDINGS POSTERIOR CIRCULATION: --Vertebral arteries: Normal V4 segments. --Posterior inferior cerebellar arteries (PICA): Patent origins from the vertebral arteries. --Anterior inferior cerebellar arteries (AICA): Patent origins from the basilar artery. --Basilar artery: Normal. --Superior cerebellar arteries: Normal. --Posterior cerebral arteries (PCA): Normal. Both originate from the basilar artery. Posterior communicating arteries (p-comm) are diminutive or absent. ANTERIOR CIRCULATION: --Intracranial internal carotid arteries: Atherosclerotic calcification of the internal carotid arteries at the skull base without hemodynamically significant stenosis. --Anterior cerebral arteries (ACA): Normal. Both A1 segments are present. Patent anterior communicating artery (a-comm). --Middle cerebral arteries (MCA): Normal. VENOUS SINUSES: As permitted by contrast timing, patent. ANATOMIC VARIANTS: None Review of the MIP images confirms the  above findings. IMPRESSION: 1. No emergent large vessel occlusion or hemodynamically significant stenosis by NASCET criteria. 2. Mild bilateral carotid bifurcation calcific atherosclerosis with less than 25% stenosis. 3.  Aortic atherosclerosis (ICD10-I70.0). Electronically Signed   By: Ulyses Jarred M.D.   On: 09/07/2018 14:21   Dg Chest Portable 1 View  Result Date: 09/12/2018 CLINICAL DATA:  Pt states cough for 2 years with Fever today EXAM:  PORTABLE CHEST 1 VIEW COMPARISON:  Chest x-ray dated 11/14/2017. FINDINGS: Heart size and mediastinal contours are stable. Lungs are clear. No acute appearing osseous abnormality. IMPRESSION: No active disease. No evidence of pneumonia or pulmonary edema. Electronically Signed   By: Bary Richard M.D.   On: 09/12/2018 08:50   Dg Abd Acute 2+v W 1v Chest  Result Date: 09/18/2018 CLINICAL DATA:  Diarrhea. EXAM: DG ABDOMEN ACUTE W/ 1V CHEST COMPARISON:  Chest x-ray 09/12/2018. FINDINGS: Mediastinum hilar structures normal. Mild left base subsegmental atelectasis. No pleural effusion or pneumothorax. Soft tissue structures of the abdomen are unremarkable. Several dilated loops of small bowel noted. Air-fluid levels in the colon. Adynamic ileus could present in this fashion. Diarrheal illness could present this fashion. To exclude developing bowel obstruction follow-up abdominal series suggested. Developing small bowel obstruction cannot be excluded and follow-up exam suggested. Of no acute bony abnormality. Degenerative change and scoliosis thoracic spine. Total right hip replacement. IMPRESSION: 1. Several dilated loops of small bowel noted. Air-fluid levels noted in colon. Adynamic ileus could present in this fashion. Diarrheal illness could present in this fashion. To exclude developing bowel obstruction follow-up abdominal series suggested. 2.  Mild left base subsegmental atelectasis. Electronically Signed   By: Maisie Fus  Register   On: 09/18/2018 12:05       Discharge Exam: Vitals:   09/19/18 0552 09/19/18 0835  BP: (!) 133/56 (!) 143/70  Pulse: 75 77  Resp: 20 18  Temp: 98.6 F (37 C) 98.2 F (36.8 C)  SpO2: 98% 97%   Vitals:   09/18/18 2015 09/18/18 2146 09/19/18 0552 09/19/18 0835  BP:  130/86 (!) 133/56 (!) 143/70  Pulse:  75 75 77  Resp: Temp:  98 F (36.7 C) 98.6 F (37 C) 98.2 F (36.8 C)  TempSrc:  Oral Oral Oral  SpO2:  100% 98% 97%  Height:        General: Pt is alert, awake, not in acute distress Cardiovascular: RRR, S1/S2 +, no rubs, no gallops Respiratory: CTA bilaterally, no wheezing, no rhonchi Abdominal: Soft, NT, ND, bowel sounds + Extremities: no edema, no cyanosis    The results of significant diagnostics from this hospitalization (including imaging, microbiology, ancillary and laboratory) are listed below for reference.     Microbiology: Recent Results (from the past 240 hour(s))  Urine culture     Status: Abnormal   Collection Time: 09/12/18 12:46 PM   Specimen: Urine, Random  Result Value Ref Range Status   Specimen Description URINE, RANDOM  Final   Special Requests   Final    NONE Performed at [facility], [address] [place], [place] [address]    Culture >=100,000 COLONIES/mL ESCHERICHIA COLI (A)  Final   Report Status 09/14/2018 FINAL  Final   Organism ID, Bacteria ESCHERICHIA COLI (A)  Final      Susceptibility   Escherichia coli - MIC*    AMPICILLIN <=2 SENSITIVE Sensitive     CEFAZOLIN <=4 SENSITIVE Sensitive     CEFTRIAXONE <=1 SENSITIVE Sensitive     CIPROFLOXACIN <=0.25 SENSITIVE Sensitive     GENTAMICIN <=1 SENSITIVE Sensitive     IMIPENEM <=0.25 SENSITIVE Sensitive     NITROFURANTOIN <=16 SENSITIVE Sensitive     TRIMETH/SULFA <=20 SENSITIVE Sensitive     AMPICILLIN/SULBACTAM <=2 SENSITIVE Sensitive     PIP/TAZO <=4 SENSITIVE Sensitive     Extended ESBL NEGATIVE Sensitive     * >=100,000 COLONIES/mL ESCHERICHIA COLI  C  difficile quick scan w  PCR reflex     Status: None   Collection Time: 09/18/18  8:25 AM   Specimen: STOOL  Result Value Ref Range Status   C Diff antigen NEGATIVE NEGATIVE Final   C Diff toxin NEGATIVE NEGATIVE Final   C Diff interpretation No C. difficile detected.  Final    Comment: Performed at [facility], [address], [place], [place] [address]  Gastrointestinal Panel by PCR , Stool     Status: Abnormal   Collection Time: 09/18/18  8:25 AM   Specimen: STOOL  Result Value Ref Range Status   Campylobacter species NOT DETECTED NOT DETECTED Final   Plesimonas shigelloides NOT DETECTED NOT DETECTED Final   Salmonella species NOT DETECTED NOT DETECTED Final   Yersinia enterocolitica NOT DETECTED NOT DETECTED Final   Vibrio species NOT DETECTED NOT DETECTED Final   Vibrio cholerae NOT DETECTED NOT DETECTED Final   Enteroaggregative E coli (EAEC) NOT DETECTED NOT DETECTED Final   Enteropathogenic E coli (EPEC) NOT DETECTED NOT DETECTED Final   Enterotoxigenic E coli (ETEC) NOT DETECTED NOT DETECTED Final   Shiga like toxin producing E coli (STEC) NOT DETECTED NOT DETECTED Final   Shigella/Enteroinvasive E coli (EIEC) NOT DETECTED NOT DETECTED Final   Cryptosporidium NOT DETECTED NOT DETECTED Final   Cyclospora cayetanensis NOT DETECTED NOT DETECTED Final   Entamoeba histolytica NOT DETECTED NOT DETECTED Final   Giardia lamblia NOT DETECTED NOT DETECTED Final   Adenovirus F40/41 NOT DETECTED NOT DETECTED Final   Astrovirus NOT DETECTED NOT DETECTED Final   Norovirus GI/GII NOT DETECTED NOT DETECTED Final   Rotavirus A NOT DETECTED NOT DETECTED Final   Sapovirus (I, II, IV, and V) DETECTED (A) NOT DETECTED Final    Comment: Performed at [facility], [address], [place], [place] [address]  SARS Coronavirus 2 (CEPHEID- Performed in [facility] hospital lab), Hosp Order     Status: None   Collection Time: 09/18/18  8:36 AM   Specimen: Nasopharyngeal Swab  Result Value Ref Range Status    SARS Coronavirus 2 NEGATIVE NEGATIVE Final    Comment: (NOTE) If result is NEGATIVE SARS-CoV-2 target nucleic acids are NOT DETECTED. The SARS-CoV-2 RNA is generally detectable in upper and lower  respiratory specimens during the acute phase of infection. The lowest  concentration of SARS-CoV-2 viral copies this assay can detect is 250  copies / mL. A negative result does not preclude SARS-CoV-2 infection  and should not be used as the sole basis for treatment or other  patient management decisions.  A negative result may occur with  improper specimen collection / handling, submission of specimen other  than nasopharyngeal swab, presence of viral mutation(s) within the  areas targeted by this assay, and inadequate number of viral copies  (<250 copies / mL). A negative result must be combined with clinical  observations, patient history, and epidemiological information. If result is POSITIVE SARS-CoV-2 target nucleic acids are DETECTED. The SARS-CoV-2 RNA is generally detectable in upper and lower  respiratory specimens dur ing the acute phase of infection.  Positive  results are indicative of active infection with SARS-CoV-2.  Clinical  correlation with patient history and other diagnostic information is  necessary to determine patient infection status.  Positive results do  not rule out bacterial infection or co-infection with other viruses. If result is PRESUMPTIVE POSTIVE SARS-CoV-2 nucleic acids MAY BE PRESENT.   A presumptive positive result was obtained on the submitted specimen  and confirmed on repeat testing.  While 2019 novel coronavirus  (SARS-CoV-2) nucleic acids may be present in the submitted sample  additional confirmatory testing may be necessary for epidemiological  and / or clinical management purposes  to differentiate between  SARS-CoV-2 and other Sarbecovirus currently known to infect humans.  If clinically indicated additional testing with an alternate test   methodology [phone]([id number]) is advised. The SARS-CoV-2 RNA is generally  detectable in upper and lower respiratory sp ecimens during the acute  phase of infection. The expected result is Negative. Fact Sheet for Patients:  BoilerBrush.com.cyhttps://www.fda.gov/media/136312/download Fact Sheet for Healthcare Providers: https://pope.com/https://www.fda.gov/media/136313/download This test is not yet approved or cleared by the [place] FDA and has been authorized for detection and/or diagnosis of SARS-CoV-2 by FDA under an Emergency Use Authorization (EUA).  This EUA will remain in effect (meaning this test can be used) for the duration of the COVID-19 declaration under Section 564(b)(1) of the Act, 21 U.S.C. section 360bbb-3(b)(1), unless the authorization is terminated or revoked sooner. Performed at [facility], [address] [place], [place] [address]      Labs: BNP (last 3 results) No results for input(s): BNP in the last 8760 hours. Basic Metabolic Panel: Recent Labs  Lab 09/18/18 0555 09/19/18 0758  NA 136 138  K 4.3 4.2  CL 103 109  CO2 22 23  GLUCOSE 104* 120*  BUN 29* 20  CREATININE 1.42* 1.07*  CALCIUM 8.5* 7.8*   Liver Function Tests: Recent Labs  Lab 09/18/18 0555  AST 33  ALT 31  ALKPHOS 76  BILITOT 0.4  PROT 7.0  ALBUMIN 2.8*   No results for input(s): LIPASE, AMYLASE in the last 168 hours. No results for input(s): AMMONIA in the last 168 hours. CBC: Recent Labs  Lab 09/18/18 0555 09/19/18 0758  WBC 5.7 6.1  NEUTROABS 3.3  --   HGB 11.8* 10.4*  HCT 38.8 33.6*  MCV 97.2 93.9  PLT 337 293   Cardiac Enzymes: No results for input(s): CKTOTAL, CKMB, CKMBINDEX, TROPONINI in the last 168 hours. BNP: Invalid input(s): POCBNP CBG: No results for input(s): GLUCAP in the last 168 hours. D-Dimer No results for input(s): DDIMER in the last 72 hours. Hgb A1c No results for input(s): HGBA1C in the last 72 hours. Lipid Profile No results for input(s): CHOL, HDL, LDLCALC,  TRIG, CHOLHDL, LDLDIRECT in the last 72 hours. Thyroid function studies No results for input(s): TSH, T4TOTAL, T3FREE, THYROIDAB in the last 72 hours.  Invalid input(s): FREET3 Anemia work up No results for input(s): VITAMINB12, FOLATE, FERRITIN, TIBC, IRON, RETICCTPCT in the last 72 hours. Urinalysis    Component Value Date/Time   COLORURINE YELLOW 09/12/2018 1157   APPEARANCEUR HAZY (A) 09/12/2018 1157   LABSPEC 1.015 09/12/2018 1157   PHURINE 5.0 09/12/2018 1157   GLUCOSEU NEGATIVE 09/12/2018 1157   HGBUR NEGATIVE 09/12/2018 1157   BILIRUBINUR NEGATIVE 09/12/2018 1157   KETONESUR NEGATIVE 09/12/2018 1157   PROTEINUR NEGATIVE 09/12/2018 1157   NITRITE POSITIVE (A) 09/12/2018 1157   LEUKOCYTESUR NEGATIVE 09/12/2018 1157   Sepsis Labs Invalid input(s): PROCALCITONIN,  WBC,  LACTICIDVEN Microbiology Recent Results (from the past 240 hour(s))  Urine culture     Status: Abnormal   Collection Time: 09/12/18 12:46 PM   Specimen: Urine, Random  Result Value Ref Range Status   Specimen Description URINE, RANDOM  Final   Special Requests   Final    NONE Performed at [facility], [address] [place], [place]  [address]    Culture >=100,000 COLONIES/mL ESCHERICHIA COLI (A)  Final   Report Status 09/14/2018 FINAL  Final   Organism ID, Bacteria ESCHERICHIA COLI (A)  Final      Susceptibility   Escherichia coli - MIC*    AMPICILLIN <=2 SENSITIVE Sensitive     CEFAZOLIN <=4 SENSITIVE Sensitive     CEFTRIAXONE <=1 SENSITIVE Sensitive     CIPROFLOXACIN <=0.25 SENSITIVE Sensitive     GENTAMICIN <=1 SENSITIVE Sensitive     IMIPENEM <=0.25 SENSITIVE Sensitive     NITROFURANTOIN <=16 SENSITIVE Sensitive     TRIMETH/SULFA <=20 SENSITIVE Sensitive     AMPICILLIN/SULBACTAM <=2 SENSITIVE Sensitive     PIP/TAZO <=4 SENSITIVE Sensitive     Extended ESBL NEGATIVE Sensitive     * >=100,000 COLONIES/mL ESCHERICHIA COLI  C difficile quick scan w PCR reflex     Status: None    Collection Time: 09/18/18  8:25 AM   Specimen: STOOL  Result Value Ref Range Status   C Diff antigen NEGATIVE NEGATIVE Final   C Diff toxin NEGATIVE NEGATIVE Final   C Diff interpretation No C. difficile detected.  Final    Comment: Performed at [facility], [address] [place], [place] [address]  Gastrointestinal Panel by PCR , Stool     Status: Abnormal   Collection Time: 09/18/18  8:25 AM   Specimen: STOOL  Result Value Ref Range Status   Campylobacter species NOT DETECTED NOT DETECTED Final   Plesimonas shigelloides NOT DETECTED NOT DETECTED Final   Salmonella species NOT DETECTED NOT DETECTED Final   Yersinia enterocolitica NOT DETECTED NOT DETECTED Final   Vibrio species NOT DETECTED NOT DETECTED Final   Vibrio cholerae NOT DETECTED NOT DETECTED Final   Enteroaggregative E coli (EAEC) NOT DETECTED NOT DETECTED Final   Enteropathogenic E coli (EPEC) NOT DETECTED NOT DETECTED Final   Enterotoxigenic E coli (ETEC) NOT DETECTED NOT DETECTED Final   Shiga like toxin producing E coli (STEC) NOT DETECTED NOT DETECTED Final   Shigella/Enteroinvasive E coli (EIEC) NOT DETECTED NOT DETECTED Final   Cryptosporidium NOT DETECTED NOT DETECTED Final   Cyclospora cayetanensis NOT DETECTED NOT DETECTED Final   Entamoeba histolytica NOT DETECTED NOT DETECTED Final   Giardia lamblia NOT DETECTED NOT DETECTED Final   Adenovirus F40/41 NOT DETECTED NOT DETECTED Final   Astrovirus NOT DETECTED NOT DETECTED Final   Norovirus GI/GII NOT DETECTED NOT DETECTED Final   Rotavirus A NOT DETECTED NOT DETECTED Final   Sapovirus (I, II, IV, and V) DETECTED (A) NOT DETECTED Final    Comment: Performed at [facility], [address] [place], [place] [address]  SARS Coronavirus 2 (CEPHEID- Performed in [facility] Hosp Order     Status: None   Collection Time: 09/18/18  8:36 AM   Specimen: Nasopharyngeal Swab  Result Value Ref Range Status   SARS Coronavirus 2 NEGATIVE  NEGATIVE Final    Comment: (NOTE) If result is NEGATIVE SARS-CoV-2 target nucleic acids are NOT DETECTED. The SARS-CoV-2 RNA is generally detectable in upper and lower  respiratory specimens during the acute phase of infection. The lowest  concentration of SARS-CoV-2 viral copies this assay can detect is 250  copies / mL. A negative result does not preclude SARS-CoV-2 infection  and should not be used as the sole basis for treatment or other  patient management decisions.  A negative result may occur with  improper specimen collection / handling, submission of specimen other  than nasopharyngeal swab, presence of viral mutation(s) within the  areas targeted by this assay, and inadequate number of viral copies  (<250 copies / mL). A negative result must be combined with clinical  observations, patient history, and epidemiological information. If result is POSITIVE SARS-CoV-2 target nucleic acids are DETECTED. The SARS-CoV-2 RNA is generally detectable in upper and lower  respiratory specimens dur ing the acute phase of infection.  Positive  results are indicative of active infection with SARS-CoV-2.  Clinical  correlation with patient history and other diagnostic information is  necessary to determine patient infection status.  Positive results do  not rule out bacterial infection or co-infection with other viruses. If result is PRESUMPTIVE POSTIVE SARS-CoV-2 nucleic acids MAY BE PRESENT.   A presumptive positive result was obtained on the submitted specimen  and confirmed on repeat testing.  While 2019 novel coronavirus  (SARS-CoV-2) nucleic acids may be present in the submitted sample  additional confirmatory testing may be necessary for epidemiological  and / or clinical management purposes  to differentiate between  SARS-CoV-2 and other Sarbecovirus currently known to infect humans.  If clinically indicated additional testing with an alternate test  methodology [phone]) is  advised. The SARS-CoV-2 RNA is generally  detectable in upper and lower respiratory sp ecimens during the acute  phase of infection. The expected result is Negative. Fact Sheet for Patients:  BoilerBrush.com.cy Fact Sheet for Healthcare Providers: https://pope.com/ This test is not yet approved or cleared by the [place] FDA and has been authorized for detection and/or diagnosis of SARS-CoV-2 by FDA under an Emergency Use Authorization (EUA).  This EUA will remain in effect (meaning this test can be used) for the duration of the COVID-19 declaration under Section 564(b)(1) of the Act, 21 U.S.C. section 360bbb-3(b)(1), unless the authorization is terminated or revoked sooner. Performed at [facility], [address], [place], [place] [address]      Time coordinating discharge: 28 minutes  SIGNED:   Hughie Closs, MD  [facility] 09/19/2018, 11:15 AM Pager [phone]  If 7PM-7AM, please contact night-coverage [email or web address] Password [email or web address]

## 2018-09-19 NOTE — Discharge Instructions (Signed)
Viral Gastroenteritis, Adult    Viral gastroenteritis is also known as the stomach flu. This condition is caused by certain germs (viruses). These germs can be passed from person to person very easily (are very contagious). This condition can cause sudden watery poop (diarrhea), fever, and throwing up (vomiting).  Having watery poop and throwing up can make you feel weak and cause you to get dehydrated. Dehydration can make you tired and thirsty, make you have a dry mouth, and make it so you pee (urinate) less often. Older adults and people with other diseases or a weak defense system (immune system) are at higher risk for dehydration. It is important to replace the fluids that you lose from having watery poop and throwing up.  Follow these instructions at home:  Follow instructions from your doctor about how to care for yourself at home.  Eating and drinking  Follow these instructions as told by your doctor:   Take an oral rehydration solution (ORS). This is a drink that is sold at pharmacies and stores.   Drink clear fluids in small amounts as you are able, such as:  ? Water.  ? Ice chips.  ? Diluted fruit juice.  ? Low-calorie sports drinks.   Eat bland, easy-to-digest foods in small amounts as you are able, such as:  ? Bananas.  ? Applesauce.  ? Rice.  ? Low-fat (lean) meats.  ? Toast.  ? Crackers.   Avoid fluids that have a lot of sugar or caffeine in them.   Avoid alcohol.   Avoid spicy or fatty foods.  General instructions     Drink enough fluid to keep your pee (urine) clear or pale yellow.   Wash your hands often. If you cannot use soap and water, use hand sanitizer.   Make sure that all people in your home wash their hands well and often.   Rest at home while you get better.   Take over-the-counter and prescription medicines only as told by your doctor.   Watch your condition for any changes.   Take a warm bath to help with any burning or pain from having watery poop.   Keep all follow-up  visits as told by your doctor. This is important.  Contact a doctor if:   You cannot keep fluids down.   Your symptoms get worse.   You have new symptoms.   You feel light-headed or dizzy.   You have muscle cramps.  Get help right away if:   You have chest pain.   You feel very weak or you pass out (faint).   You see blood in your throw-up.   Your throw-up looks like coffee grounds.   You have bloody or black poop (stools) or poop that look like tar.   You have a very bad headache, a stiff neck, or both.   You have a rash.   You have very bad pain, cramping, or bloating in your belly (abdomen).   You have trouble breathing.   You are breathing very quickly.   Your heart is beating very quickly.   Your skin feels cold and clammy.   You feel confused.   You have pain when you pee.   You have signs of dehydration, such as:  ? Dark pee, hardly any pee, or no pee.  ? Cracked lips.  ? Dry mouth.  ? Sunken eyes.  ? Sleepiness.  ? Weakness.  This information is not intended to replace advice given to you by your   health care provider. Make sure you discuss any questions you have with your health care provider.  Document Released: 09/01/2007 Document Revised: 12/07/2017 Document Reviewed: 11/19/2014  Elsevier Interactive Patient Education  2019 Elsevier Inc.

## 2018-09-19 NOTE — Evaluation (Signed)
Physical Therapy Evaluation Patient Details Name: Robin Moreno MRN: [id number] DOB: 1925-11-13 Today's Date: 09/19/2018   History of Present Illness  Robin Moreno is a 85 y.o. female with medical history significant of HTN, HLD, DDD, osteoarthritis, and per patient rheumatoid arthritis; who presents with complaints of joint pain.  Clinical Impression  Pt admitted with above. Spoke with Son who reports up until beginning of the month when she had a small stroke she was indep with rolling walker and making her meals, since then he had to assist her into the bathroom, set up her bath, make all 3 meals for her and has done limited ambulation with rollator. Pt with noted L sided weakness and is requiring mod/maxA for sit to stand and modA for safe ambulation. Son reports he is there 24/7 and is requesting a [place] aide to assist patient with bathing because his Mother doesn't want him to assist her with bathing. Acute PT to cont to follow.    Follow Up Recommendations Home health PT;Supervision/Assistance - 24 hour and home health aide    Equipment Recommendations  None recommended by PT    Recommendations for Other Services       Precautions / Restrictions Precautions Precautions: Fall Precaution Comments: pt extremely hard of hearing Restrictions Weight Bearing Restrictions: No      Mobility  Bed Mobility Overal bed mobility: Needs Assistance Bed Mobility: Supine to Sit     Supine to sit: Min assist     General bed mobility comments: pt able to bring self to long sit and initiatiate LEs off EOB however due to limited L UE function pt unable to scoot to EOB and get feet on the floor requiring assist  Transfers Overall transfer level: Needs assistance Equipment used: Rolling walker (2 wheeled) Transfers: Sit to/from Stand Sit to Stand: Mod assist;Max assist         General transfer comment: Pt with noted L sided weakness, per son pt had a small stroke at the beginning of the  month. Pt required maxA to power up and steady during transition of hands from bed to RW.   Ambulation/Gait Ambulation/Gait assistance: Min assist Gait Distance (Feet): 12 Feet Assistive device: Rolling walker (2 wheeled) Gait Pattern/deviations: Step-to pattern;Decreased stride length;Trunk flexed Gait velocity: slow Gait velocity interpretation: <1.31 ft/sec, indicative of household ambulator General Gait Details: pt very slow, minA for balance, verbal cues to stay in walker, minA for walker management, pt with uncontrolled descent into chair, noted decreased safety awareness  Stairs            Wheelchair Mobility    Modified Rankin (Stroke Patients Only)       Balance Overall balance assessment: Needs assistance Sitting-balance support: Feet supported;No upper extremity supported Sitting balance-Leahy Scale: Fair Sitting balance - Comments: pt attempted to don socks at EOB however reports "I just can't reach it with this Left shoulder the way it is"   Standing balance support: Bilateral upper extremity supported Standing balance-Leahy Scale: Poor Standing balance comment: pt dependent on physical assist and RW for safe standing balance                             Pertinent Vitals/Pain Pain Assessment: Faces Faces Pain Scale: Hurts a little bit Pain Location: L shoulder Pain Descriptors / Indicators: Sore Pain Intervention(s): Monitored during session    Home Living Family/patient expects to be discharged to:: Private residence Living Arrangements: Children Available  Help at Discharge: Family;Available 24 hours/day Type of Home: House Home Access: Stairs to enter Entrance Stairs-Rails: Can reach both Entrance Stairs-Number of Steps: 6 Home Layout: One level Home Equipment: Walker - 4 wheels      Prior Function Level of Independence: Needs assistance   Gait / Transfers Assistance Needed: amb short distances with rollator  ADL's / Homemaking  Assistance Needed: sponge bathing, son assist with transfer into bathroom because walker doesn't fit  Comments: pt was doing simple meal prep but hasn't in a few weeks. Pt's son reports he has been cooking all 3 meals for her, requesting and AIDE for bathing     Hand Dominance   Dominant Hand: Right    Extremity/Trunk Assessment   Upper Extremity Assessment Upper Extremity Assessment: LUE deficits/detail LUE Deficits / Details: limited functional use due to "this shoulder is awful" pt with limited shld ROM and WBing tolerance on RW    Lower Extremity Assessment Lower Extremity Assessment: LLE deficits/detail LLE Deficits / Details: decreased strength compared to R,    Cervical / Trunk Assessment Cervical / Trunk Assessment: Kyphotic  Communication   Communication: HOH  Cognition Arousal/Alertness: Awake/alert Behavior During Therapy: WFL for tasks assessed/performed Overall Cognitive Status: Impaired/Different from baseline Area of Impairment: Safety/judgement;Problem solving;Awareness                         Safety/Judgement: Decreased awareness of safety;Decreased awareness of deficits Awareness: Intellectual Problem Solving: Slow processing;Difficulty sequencing;Requires verbal cues;Requires tactile cues General Comments: per son, [name], since patient has a mini stroke beginning of this month her activity has decreased      General Comments General comments (skin integrity, edema, etc.): VSS, asked pt to use restroom however stated "no I have pads underneath me"    Exercises     Assessment/Plan    PT Assessment Patient needs continued PT services  PT Problem List Decreased strength;Decreased range of motion;Decreased activity tolerance;Decreased balance;Decreased mobility;Decreased coordination;Decreased cognition;Decreased knowledge of use of DME;Decreased safety awareness;Decreased knowledge of precautions       PT Treatment Interventions DME  instruction;Gait training;Stair training;Functional mobility training;Therapeutic activities;Therapeutic exercise;Balance training;Neuromuscular re-education;Cognitive remediation    PT Goals (Current goals can be found in the Care Plan section)  Acute Rehab PT Goals Patient Stated Goal: get stronger in the L shld PT Goal Formulation: With patient/family Time For Goal Achievement: 10/03/18 Potential to Achieve Goals: Good    Frequency Min 3X/week   Barriers to discharge        Co-evaluation               AM-PAC PT "6 Clicks" Mobility  Outcome Measure Help needed turning from your back to your side while in a flat bed without using bedrails?: A Little Help needed moving from lying on your back to sitting on the side of a flat bed without using bedrails?: A Little Help needed moving to and from a bed to a chair (including a wheelchair)?: A Lot Help needed standing up from a chair using your arms (e.g., wheelchair or bedside chair)?: A Lot Help needed to walk in hospital room?: A Lot Help needed climbing 3-5 steps with a railing? : A Lot 6 Click Score: 14    End of Session Equipment Utilized During Treatment: Gait belt Activity Tolerance: Patient tolerated treatment well Patient left: in chair;with call bell/phone within reach Nurse Communication: Mobility status PT Visit Diagnosis: Muscle weakness (generalized) (M62.81);Difficulty in walking, not elsewhere classified (R26.2)  Time: 0017-4944 PT Time Calculation (min) (ACUTE ONLY): 30 min   Charges:   PT Evaluation $PT Eval Moderate Complexity: 1 Mod PT Treatments $Gait Training: 8-22 mins        Lewis Shock, PT, DPT Acute Rehabilitation Services Pager #: [phone] Office #: [phone]   Iona Hansen 09/19/2018, 8:58 AM

## 2018-09-19 NOTE — Care Management Obs Status (Signed)
[place] NOTIFICATION   Patient Details  Name: Robin Moreno MRN: [id number] Date of Birth: 1926/01/16   Medicare Observation Status Notification Given:  Yes    Zenon Mayo, RN 09/19/2018, 1:04 PM

## 2018-09-19 NOTE — TOC Transition Note (Addendum)
Transition of Care [facility]) - CM/SW Discharge Note   Patient Details  Name: Robin Moreno MRN: [id number] Date of Birth: June 08, 1925  [facility] CM/SW Contact:  Zenon Mayo, RN Phone Number: 09/19/2018, 12:54 PM   Clinical Narrative:    Patient is from home with son , Coralyn Pear, he cooks her meals for her. He states she has a cane and a rollator, and he has ordered her a new bed from [employer], so he does not want a hospital bed.  She will need HHPT and HHAIDE.  NCM offered choice, he chose [place]. Referral made to [facility].  He states he can take referral.  Soc will begin 24-48 hrs post dc.  NCM also asked Daryl if NCM could refer patient to [facility] [employer] , he said yes, referral given to [facility].   Final next level of care: [place] Barriers to Discharge: No Barriers Identified   Patient Goals and CMS Choice Patient states their goals for this hospitalization and ongoing recovery are:: to get back to where she was before CMS Medicare.gov Compare Post Acute Care list provided to:: Patient Represenative (must comment)(Son, Daryl) Choice offered to / list presented to : Adult Children  Discharge Placement                       Discharge Plan and Services                DME Arranged: (NA)         HH Arranged: PT, Nurse's Aide HH Agency: [place] Date [facility] Agency Contacted: 09/19/18 Time [place]: [place] Representative spoke with at [place]: cory  Social Determinants of Health ([place]) Interventions     Readmission Risk Interventions No flowsheet data found.

## 2018-09-19 NOTE — Progress Notes (Signed)
Reviewed discharge instructions with patient's son. He verbalized understanding and all questions were answered.

## 2018-09-19 NOTE — Progress Notes (Signed)
Occupational Therapy Evaluation Patient Details Name: Robin Moreno MRN: [id number] DOB: 1925-12-26 Today's Date: 09/19/2018    History of Present Illness Robin Moreno is a 85 y.o. female with medical history significant of HTN, HLD, DDD, osteoarthritis, and per patient rheumatoid arthritis; who presents with complaints of joint pain.   Clinical Impression   Patient evaluated by Occupational Therapy with no further acute OT needs identified. All education has been completed and the patient has no further questions. Pt currently requires min A for UB ADLs and mod - max A for LB ADLs, and mod A for sit to stand due to painful joints.  She lives with her son, who has been assisting at home.  Pt is for discharge home today.  Recommend HHOT, PT, and aide.   See below for any follow-up Occupational Therapy or equipment needs. OT is signing off. Thank you for this referral.      Follow Up Recommendations  Home health OT;Supervision/Assistance - 24 hour;Other (comment)(HHaid )    Equipment Recommendations  None recommended by OT    Recommendations for Other Services       Precautions / Restrictions Precautions Precautions: Fall Precaution Comments: pt extremely hard of hearing Restrictions Weight Bearing Restrictions: No      Mobility Bed Mobility Overal bed mobility: Needs Assistance Bed Mobility: Supine to Sit;Sit to Supine     Supine to sit: Min guard;HOB elevated Sit to supine: Min assist;HOB elevated   General bed mobility comments: Pt able to move to EOB with HOB elevated.  Required assist to lift feet onto bed   Transfers Overall transfer level: Needs assistance Equipment used: Rolling walker (2 wheeled) Transfers: Sit to/from Stand Sit to Stand: Mod assist;Max assist         General transfer comment: Pt with noted L sided weakness, per son pt had a small stroke at the beginning of the month. Pt required maxA to power up and steady during transition of hands from bed  to RW.     Balance Overall balance assessment: Needs assistance Sitting-balance support: Feet supported;No upper extremity supported Sitting balance-Leahy Scale: Fair     Standing balance support: Bilateral upper extremity supported Standing balance-Leahy Scale: Poor Standing balance comment: pt dependent on bil. UE support                            ADL either performed or assessed with clinical judgement   ADL Overall ADL's : Needs assistance/impaired Eating/Feeding: Independent   Grooming: Wash/dry hands;Wash/dry face;Oral care;Brushing hair;Minimal assistance;Standing   Upper Body Bathing: Set up;Sitting   Lower Body Bathing: Moderate assistance;Sit to/from stand   Upper Body Dressing : Minimal assistance;Sitting   Lower Body Dressing: Maximal assistance;Sit to/from stand Lower Body Dressing Details (indicate cue type and reason): Pt with difficulty accessing feet  Toilet Transfer: Moderate assistance;Ambulation;Comfort height toilet;Grab bars;RW Statistician Details (indicate cue type and reason): difficulty with sit to stand from lower surface  Toileting- Clothing Manipulation and Hygiene: Minimal assistance;Sit to/from stand       Functional mobility during ADLs: Moderate assistance;Rolling walker General ADL Comments: Pt requires assist moving sit to stand      Vision Patient Visual Report: No change from baseline       Perception     Praxis      Pertinent Vitals/Pain Pain Assessment: Faces Faces Pain Scale: Hurts little more Pain Location: L shoulder Pain Descriptors / Indicators: Sore Pain Intervention(s): Monitored during  session     Hand Dominance Right   Extremity/Trunk Assessment Upper Extremity Assessment Upper Extremity Assessment: LUE deficits/detail LUE Deficits / Details: AROM WFL, but pt reports pain in shoulder and difficulty using it    Lower Extremity Assessment Lower Extremity Assessment: Defer to PT evaluation    Cervical / Trunk Assessment Cervical / Trunk Assessment: Kyphotic   Communication Communication Communication: HOH   Cognition Arousal/Alertness: Awake/alert Behavior During Therapy: WFL for tasks assessed/performed Overall Cognitive Status: Impaired/Different from baseline Area of Impairment: Safety/judgement;Problem solving;Awareness                         Safety/Judgement: Decreased awareness of safety;Decreased awareness of deficits Awareness: Intellectual Problem Solving: Slow processing;Difficulty sequencing;Requires verbal cues;Requires tactile cues General Comments: Pt scored 13 on the BIMS which does not indicate cognitive impairment, however, she is slow to initiate activity.  Her hearing loss could be impacting performance   Would benefit from more in depth cognitive assessment    General Comments       Exercises     Shoulder Instructions      Home Living Family/patient expects to be discharged to:: Private residence Living Arrangements: Children Available Help at Discharge: Family;Available 24 hours/day Type of Home: House Home Access: Stairs to enter [employer] of Steps: 6 Entrance Stairs-Rails: Can reach both Home Layout: One level     Bathroom Shower/Tub: Chief Strategy Officer: Handicapped height Bathroom Accessibility: No   Home Equipment: Environmental consultant - 4 wheels;Bedside commode   Additional Comments: Pt reports her son is with her much of the time       Prior Functioning/Environment Level of Independence: Needs assistance  Gait / Transfers Assistance Needed: amb short distances with rollator ADL's / Homemaking Assistance Needed: sponge bathing, son assist with transfer into bathroom because walker doesn't fit   Comments: pt was doing simple meal prep but hasn't in a few weeks. Pt's son reports he has been cooking all 3 meals for her, requesting and AIDE for bathing        OT Problem List: Decreased  strength;Decreased activity tolerance;Impaired balance (sitting and/or standing);Decreased safety awareness;Pain      OT Treatment/Interventions:      OT Goals(Current goals can be found in the care plan section) Acute Rehab OT Goals Patient Stated Goal: To be able to get out of bed  OT Goal Formulation: All assessment and education complete, DC therapy  OT Frequency:     Barriers to D/C:            Co-evaluation              AM-PAC OT "6 Clicks" Daily Activity     Outcome Measure Help from another person eating meals?: None Help from another person taking care of personal grooming?: A Little Help from another person toileting, which includes using toliet, bedpan, or urinal?: A Lot Help from another person bathing (including washing, rinsing, drying)?: A Lot Help from another person to put on and taking off regular upper body clothing?: A Little Help from another person to put on and taking off regular lower body clothing?: A Lot 6 Click Score: 16   End of Session Equipment Utilized During Treatment: Rolling walker;Gait belt  Activity Tolerance: Patient tolerated treatment well Patient left: in bed;with call bell/phone within reach;with bed alarm set  OT Visit Diagnosis: Unsteadiness on feet (R26.81);Pain Pain - Right/Left: Left Pain - part of body: Shoulder  Time: 7897-8478 OT Time Calculation (min): 22 min Charges:  OT General Charges $OT Visit: 1 Visit OT Evaluation $OT Eval Moderate Complexity: 1 Mod  Lucille Passy, OTR/L Acute Rehabilitation Services Pager [phone] Office [phone]   Lucille Passy M 09/19/2018, 12:17 PM

## 2018-09-27 ENCOUNTER — Telehealth: Payer: Self-pay

## 2018-09-27 NOTE — Telephone Encounter (Signed)
I called son Daryle that is moms Ct neck and head showed no major blockages of major blood vessels. Nothing to worry about. He verbalized understanding.  ------

## 2018-09-27 NOTE — Telephone Encounter (Signed)
Notes recorded by Marval Regal, RN on 09/27/2018 at 9:54 AM EDT  Left vm for patients son Daryle to call back about his moms CT head results.  ------

## 2018-09-27 NOTE — Telephone Encounter (Signed)
-----   Message from Garvin Fila, MD sent at 09/11/2018 12:40 PM EDT ----- Mitchell Heir inform patient that CT angiogram of brain and neck show no major blockages of major blood vessels in neck and brain to worry about

## 2018-09-29 ENCOUNTER — Emergency Department (HOSPITAL_COMMUNITY): Payer: Medicare Other

## 2018-09-29 ENCOUNTER — Encounter (HOSPITAL_COMMUNITY): Payer: Self-pay | Admitting: Emergency Medicine

## 2018-09-29 ENCOUNTER — Other Ambulatory Visit: Payer: Self-pay

## 2018-09-29 ENCOUNTER — Emergency Department (HOSPITAL_COMMUNITY)
Admission: EM | Admit: 2018-09-29 | Discharge: 2018-10-03 | Disposition: A | Discharge status: Other Acute Inpatient Hospital | Payer: Medicare Other | Attending: Emergency Medicine | Admitting: Emergency Medicine

## 2018-09-29 DIAGNOSIS — R262 Difficulty in walking, not elsewhere classified: Secondary | ICD-10-CM | POA: Insufficient documentation

## 2018-09-29 DIAGNOSIS — R5383 Other fatigue: Secondary | ICD-10-CM | POA: Diagnosis present

## 2018-09-29 DIAGNOSIS — G319 Degenerative disease of nervous system, unspecified: Secondary | ICD-10-CM | POA: Diagnosis not present

## 2018-09-29 DIAGNOSIS — I10 Essential (primary) hypertension: Secondary | ICD-10-CM | POA: Insufficient documentation

## 2018-09-29 DIAGNOSIS — Z03818 Encounter for observation for suspected exposure to other biological agents ruled out: Secondary | ICD-10-CM | POA: Insufficient documentation

## 2018-09-29 DIAGNOSIS — M791 Myalgia, unspecified site: Secondary | ICD-10-CM | POA: Diagnosis not present

## 2018-09-29 HISTORY — DX: Disorder of kidney and ureter, unspecified: N28.9

## 2018-09-29 LAB — COMPREHENSIVE METABOLIC PANEL
ALT: 19 U/L (ref 0–44)
AST: 25 U/L (ref 15–41)
Albumin: 2.5 g/dL — ABNORMAL LOW (ref 3.5–5.0)
Alkaline Phosphatase: 63 U/L (ref 38–126)
Anion gap: 11 (ref 5–15)
BUN: 16 mg/dL (ref 8–23)
CO2: 23 mmol/L (ref 22–32)
Calcium: 8.6 mg/dL — ABNORMAL LOW (ref 8.9–10.3)
Chloride: 102 mmol/L (ref 98–111)
Creatinine, Ser: 1.22 mg/dL — ABNORMAL HIGH (ref 0.44–1.00)
GFR calc Af Amer: 45 mL/min — ABNORMAL LOW (ref >60)
GFR calc non Af Amer: 39 mL/min — ABNORMAL LOW (ref >60)
Glucose, Bld: 108 mg/dL — ABNORMAL HIGH (ref 70–99)
Potassium: 4.7 mmol/L (ref 3.5–5.1)
Sodium: 136 mmol/L (ref 135–145)
Total Bilirubin: 0.7 mg/dL (ref 0.3–1.2)
Total Protein: 6.7 g/dL (ref 6.5–8.1)

## 2018-09-29 LAB — URINALYSIS, ROUTINE W REFLEX MICROSCOPIC
Bilirubin Urine: NEGATIVE
Glucose, UA: NEGATIVE mg/dL
Hgb urine dipstick: NEGATIVE
Ketones, ur: NEGATIVE mg/dL
Leukocytes,Ua: NEGATIVE
Nitrite: NEGATIVE
Protein, ur: NEGATIVE mg/dL
Specific Gravity, Urine: 1.013 (ref 1.005–1.030)
pH: 5 (ref 5.0–8.0)

## 2018-09-29 LAB — CBC WITH DIFFERENTIAL/PLATELET
Abs Immature Granulocytes: 0.08 10*3/uL — ABNORMAL HIGH (ref 0.00–0.07)
Basophils Absolute: 0 10*3/uL (ref 0.0–0.1)
Basophils Relative: 1 %
Eosinophils Absolute: 0.1 10*3/uL (ref 0.0–0.5)
Eosinophils Relative: 2 %
HCT: 36.2 % (ref 36.0–46.0)
Hemoglobin: 11.1 g/dL — ABNORMAL LOW (ref 12.0–15.0)
Immature Granulocytes: 1 %
Lymphocytes Relative: 18 %
Lymphs Abs: 1.3 10*3/uL (ref 0.7–4.0)
MCH: 28.9 pg (ref 26.0–34.0)
MCHC: 30.7 g/dL (ref 30.0–36.0)
MCV: 94.3 fL (ref 80.0–100.0)
Monocytes Absolute: 0.8 10*3/uL (ref 0.1–1.0)
Monocytes Relative: 11 %
Neutro Abs: 4.8 10*3/uL (ref 1.7–7.7)
Neutrophils Relative %: 67 %
Platelets: 400 10*3/uL (ref 150–400)
RBC: 3.84 MIL/uL — ABNORMAL LOW (ref 3.87–5.11)
RDW: 13.7 % (ref 11.5–15.5)
WBC: 7.2 10*3/uL (ref 4.0–10.5)
nRBC: 0 % (ref 0.0–0.2)

## 2018-09-29 LAB — TROPONIN I (HIGH SENSITIVITY)
Troponin I (High Sensitivity): 6 ng/L (ref <18)
Troponin I (High Sensitivity): 6 ng/L (ref <18)

## 2018-09-29 MED ORDER — POLYETHYLENE GLYCOL 3350 17 G PO PACK
17.0000 g | PACK | Freq: Every day | ORAL | Status: DC
  Administered 2018-09-29 – 2018-10-03 (×5): 17 g via ORAL
  Filled 2018-09-29 (×5): qty 1

## 2018-09-29 MED ORDER — LACTATED RINGERS IV BOLUS
1000.0000 mL | Freq: Once | INTRAVENOUS | Status: AC
  Administered 2018-09-29: 06:00:00 1000 mL via INTRAVENOUS

## 2018-09-29 NOTE — ED Notes (Signed)
Pt. Drinking coffee.

## 2018-09-29 NOTE — ED Provider Notes (Signed)
Emergency Department Provider Note   I have reviewed the triage vital signs and the nursing notes.   HISTORY  Chief Complaint Fatigue   HPI Robin Moreno is a 85 y.o. female who presents the emergency department today with very nonspecific complaints.  Patient states that she has pain all over and wants to go somewhere where she can have pool therapy.  She has no new symptoms.  I discussed with the son who she lives with about what his concerns were.  He very bruised the told me that she need to be admitted to the hospital so she could go to rehab.  He stated that there is no way she could come back home secondary to her pain which is causing her difficulty walking.  He states over and over again that she had vertigo a month ago has a history of kidney infections and also diarrhea last week.  He does not know of any new complaints but also has not really cooperative with questioning just perseverates about her need to go to rehabilitation.   No other associated or modifying symptoms.    Past Medical History:  Diagnosis Date  . Arthritis   . Degenerative disk disease   . Hypercholesterolemia   . Hypertension   . Osteoarthritis of right knee   . Renal disorder     Patient Active Problem List   Diagnosis Date Noted  . Viral gastroenteritis due to Sapporo agent 09/19/2018  . Acute kidney injury superimposed on CKD ([facility]) 09/18/2018  . Diarrhea 09/18/2018  . Urinary tract infection 09/18/2018  . Generalized weakness 09/18/2018  . Arthritis 09/18/2018    Past Surgical History:  Procedure Laterality Date  . ABDOMINAL HYSTERECTOMY    . TOTAL HIP ARTHROPLASTY      Current Outpatient Rx  . Order #: [id number] Class: Print  . Order #: [id number] Class: Historical Med  . Order #: [id number] Class: Historical Med  . Order #: [id number] Class: Historical Med  . Order #: [id number] Class: Normal  . Order #: [id number] Class: Historical Med  . Order #: [id number] Class: Print  . Order #:  [id number] Class: Historical Med  . Order #: [id number] Class: Historical Med  . Order #: [id number] Class: Historical Med  . Order #: [id number] Class: Historical Med  . Order #: [id number] Class: Print  . Order #: [id number] Class: Print    Allergies Patient has no known allergies.  History reviewed. No pertinent family history.  Social History Social History   Tobacco Use  . Smoking status: Never Smoker  . Smokeless tobacco: Never Used  Substance Use Topics  . Alcohol use: No  . Drug use: No    Review of Systems  All other systems negative except as documented in the HPI. All pertinent positives and negatives as reviewed in the HPI. ____________________________________________   PHYSICAL EXAM:  VITAL SIGNS: ED Triage Vitals  Enc Vitals Group     BP 09/29/18 0523 (!) 118/54     Pulse Rate 09/29/18 0523 80     Resp 09/29/18 0523 16     Temp 09/29/18 0523 98.3 F (36.8 C)     Temp Source 09/29/18 0523 Oral     SpO2 09/29/18 0517 96 %     Weight 09/29/18 0525 143 lb (64.9 kg)     Height 09/29/18 0525 5' (1.524 m)    Constitutional: Alert and oriented. Well appearing and in no acute distress. Eyes: Conjunctivae are normal. PERRL. EOMI. Sunken eyes. Head: Atraumatic. Nose: No congestion/rhinnorhea. Mouth/Throat: Mucous membranes are moist.  Oropharynx non-erythematous.  Neck: No stridor.  No meningeal signs.   Cardiovascular: Normal rate, regular rhythm. Good peripheral circulation. Heart murmur present. Respiratory: Normal respiratory effort.  No retractions. Lungs CTAB. Gastrointestinal: Soft and nontender. No distention.  Musculoskeletal: No lower extremity tenderness nor edema. No gross deformities of extremities. Neurologic:  Normal speech and language. No gross focal neurologic deficits are appreciated.  Skin:  Skin is warm, dry and intact. No rash noted.  ____________________________________________   LABS (all labs ordered are listed, but only abnormal results are  displayed)  Labs Reviewed  CBC WITH DIFFERENTIAL/PLATELET - Abnormal; Notable for the following components:      Result Value   RBC 3.84 (*)    Hemoglobin 11.1 (*)    Abs Immature Granulocytes 0.08 (*)    All other components within normal limits  COMPREHENSIVE METABOLIC PANEL - Abnormal; Notable for the following components:   Glucose, Bld 108 (*)    Creatinine, Ser 1.22 (*)    Calcium 8.6 (*)    Albumin 2.5 (*)    GFR calc non Af Amer 39 (*)    GFR calc Af Amer 45 (*)    All other components within normal limits  SARS CORONAVIRUS 2 (HOSPITAL ORDER, PERFORMED IN [facility])  TROPONIN I (HIGH SENSITIVITY)  TROPONIN I (HIGH SENSITIVITY)  URINALYSIS, ROUTINE W REFLEX MICROSCOPIC   ____________________________________________  EKG   EKG Interpretation  Date/Time:  Friday September 29 2018 05:26:44 EDT Ventricular Rate:  68 PR Interval:    QRS Duration: 92 QT Interval:  394 QTC Calculation: 419 R Axis:   13 Text Interpretation:  Sinus rhythm Prolonged PR interval Abnormal R-wave progression, early transition Borderline T abnormalities, inferior leads No significant change since last tracing today or june 22 Confirmed by Marily MemosMesner, Naftoli Penny [phone]([id number]) on 09/29/2018 5:30:07 AM       ____________________________________________  RADIOLOGY  No results found.  ____________________________________________   PROCEDURES  Procedure(s) performed:   Procedures   ____________________________________________   INITIAL IMPRESSION / ASSESSMENT AND PLAN / ED COURSE  Patient seems to have pretty significant arthritis which is apparently got worse recently causing her to be having difficulty sleeping and generalized weakness.  Will evaluate for any acute causes of her increased weakness however suspect this will be futile.  If these are all negative patient is medically cleared to be discharged to what ever seems to be an appropriate safe plan.  Will consult case management  anticipating that the son would not take her back home.   care transferred pending urine.   Pertinent labs & imaging results that were available during my care of the patient were reviewed by me and considered in my medical decision making (see chart for details).  A medical screening exam was performed and I feel the patient has had an appropriate workup for their chief complaint at this time and likelihood of emergent condition existing is low. They have been counseled on decision, discharge, follow up and which symptoms necessitate immediate return to the emergency department. They or their family verbally stated understanding and agreement with plan and discharged in stable condition.   ____________________________________________  FINAL CLINICAL IMPRESSION(S) / ED DIAGNOSES  Final diagnoses:  Myalgia     MEDICATIONS GIVEN DURING THIS VISIT:  Medications  lactated ringers bolus 1,000 mL (0 mLs Intravenous Stopped 09/29/18 0712)     NEW OUTPATIENT MEDICATIONS STARTED DURING THIS VISIT:  Discharge Medication List as of 10/03/2018 12:33 PM      Note:  This note was  prepared with assistance of Systems analyst. Occasional wrong-word or sound-a-like substitutions may have occurred due to the inherent limitations of voice recognition software.   Merrily Pew, MD 10/04/18 [phone]

## 2018-09-29 NOTE — ED Notes (Signed)
Waiting on Social Worker consult.

## 2018-09-29 NOTE — TOC Initial Note (Signed)
Transition of Care [facility]) - Initial/Assessment Note    Patient Details  Name: Robin Moreno MRN: [id number] Date of Birth: Sep 28, 1925  Transition of Care [facility]) CM/SW Contact:    Fuller Mandril, RN Phone Number: 09/29/2018, 8:59 AM  Clinical Narrative:                 [facility] consulted regarding assessment for home health vs SNF placement.  Expected Discharge Plan: Skilled Nursing Facility Barriers to Discharge: Continued Medical Work up   Patient Goals and CMS Choice Patient states their goals for this hospitalization and ongoing recovery are:: get moving again      Expected Discharge Plan and Services Expected Discharge Plan: [place] In-house Referral: Clinical Social Work Discharge Planning Services: CM Consult   Living arrangements for the past 2 months: Single Family Home Expected Discharge Date: (unsure)                                    Prior Living Arrangements/Services Living arrangements for the past 2 months: Single Family Home Lives with:: Adult Children Patient language and need for interpreter reviewed:: Yes Do you feel safe going back to the place where you live?: Yes      Need for Family Participation in Patient Care: Yes (Comment) Care giver support system in place?: Yes (comment) Current home services: Home PT, Home RN Criminal Activity/Legal Involvement Pertinent to Current Situation/Hospitalization: No - Comment as needed  Activities of Daily Living Home Assistive Devices/Equipment: Eyeglasses, Bedside commode/3-in-1, Walker (specify type) ADL Screening (condition at time of admission) Patient's cognitive ability adequate to safely complete daily activities?: Yes Is the patient deaf or have difficulty hearing?: No Does the patient have difficulty seeing, even when wearing glasses/contacts?: Yes Does the patient have difficulty concentrating, remembering, or making decisions?: No Patient able to express need for assistance with  ADLs?: Yes Does the patient have difficulty dressing or bathing?: Yes Independently performs ADLs?: No Communication: Independent Dressing (OT): Needs assistance Is this a change from baseline?: Change from baseline, expected to last >3 days Grooming: Dependent Is this a change from baseline?: Change from baseline, expected to last >3 days Feeding: Independent Bathing: Dependent Is this a change from baseline?: Change from baseline, expected to last >3 days Toileting: Dependent Is this a change from baseline?: Change from baseline, expected to last >3days In/Out Bed: Dependent Is this a change from baseline?: Change from baseline, expected to last >3 days Walks in Home: Dependent Is this a change from baseline?: Change from baseline, expected to last >3 days Weakness of Legs: Both  Permission Sought/Granted Permission sought to share information with : Case Manager, Family Supports Permission granted to share information with : Yes, Verbal Permission Granted           Permission granted to share info w Contact Information: [name]  Emotional Assessment Appearance:: Appears stated age Attitude/Demeanor/Rapport: Gracious, Engaged, Ambitious Affect (typically observed): Accepting, Appropriate, Hopeful Orientation: : Oriented to Self, Oriented to Place, Oriented to  Time, Oriented to Situation Alcohol / Substance Use: Never Used Psych Involvement: No (comment)  Admission diagnosis:  sick Patient Active Problem List   Diagnosis Date Noted  . Viral gastroenteritis due to Sapporo agent 09/19/2018  . Acute kidney injury superimposed on CKD ([place]) 09/18/2018  . Diarrhea 09/18/2018  . Urinary tract infection 09/18/2018  . Generalized weakness 09/18/2018  . Arthritis 09/18/2018   PCP:  Jilda Panda, MD  Pharmacy:   [facility] [address] [place] - [address] [place] [address] Phone: [phone] Fax: [phone]  [facility] - [place],  [place] - [address] AT [facility] [address] [place] [address] Phone: [phone] Fax: [phone]  [id number] [place], [place] - [address] AT [facility] [address] [place] [address] Phone: [phone] Fax: [phone]     Social Determinants of Health (SDOH) Interventions    Readmission Risk Interventions No flowsheet data found.

## 2018-09-29 NOTE — Evaluation (Signed)
Occupational Therapy Evaluation Patient Details Name: Robin Moreno MRN: [id number] DOB: March 06, 1926 Today's Date: 09/29/2018    History of Present Illness Robin Moreno is a 85 y.o. female with medical history significant of HTN, HLD, DDD, osteoarthritis, and per patient rheumatoid arthritis; who presents with complaints of joint pain, diarrhea, debility.   Clinical Impression   Pt admitted with the above diagnosis and demonstrates the below listed deficits.  She was seen ~10 days ago, and was discharged home.  On 6/23, pt required mod A, overall for ADLs, and min - mod A for functional mobility.  Today, she requires max - total A, overall for ADLs, and max A for functional mobility.  She has been living with her son, who is no longer able to provide the necessary level of assist she requires.  She has had a significant decline in function over the past few weeks/months.  She is eager to regain as much independence as possible.  Recommend SNF level rehab as she would benefit from 5-7 days of therapy/week to allow her to maximize her safety and independence in order to return home at a level son is able to provide assist.   All further OT needs can be addressed at SNF.     Follow Up Recommendations  SNF;Supervision/Assistance - 24 hour    Equipment Recommendations  None recommended by OT    Recommendations for Other Services       Precautions / Restrictions Precautions Precautions: Fall Restrictions Weight Bearing Restrictions: No      Mobility Bed Mobility Overal bed mobility: Needs Assistance Bed Mobility: Rolling     Supine to sit: Max assist Sit to supine: Mod assist   General bed mobility comments: Pt required max A to roll Lt and Rt   Transfers Overall transfer level: Needs assistance Equipment used: Rolling walker (2 wheeled) Transfers: Sit to/from Stand Sit to Stand: Max assist         General transfer comment: did not attempt     Balance Overall balance  assessment: Needs assistance Sitting-balance support: Feet supported;No upper extremity supported Sitting balance-Leahy Scale: Fair     Standing balance support: Bilateral upper extremity supported Standing balance-Leahy Scale: Poor Standing balance comment: pt dependent on physical assist and RW for safe standing balance                           ADL either performed or assessed with clinical judgement   ADL Overall ADL's : Needs assistance/impaired Eating/Feeding: Set up;Bed level   Grooming: Wash/dry hands;Wash/dry face;Set up;Bed level   Upper Body Bathing: Moderate assistance;Bed level   Lower Body Bathing: Total assistance;Bed level   Upper Body Dressing : Total assistance;Bed level   Lower Body Dressing: Total assistance;Bed level   Toilet Transfer: Total assistance Toilet Transfer Details (indicate cue type and reason): pt unable  Toileting- Clothing Manipulation and Hygiene: Total assistance;Bed level       Functional mobility during ADLs: Maximal assistance General ADL Comments: Pt limited with amount of assist she is able to provide to shift and turn in bed      Vision Baseline Vision/History: Wears glasses Wears Glasses: Reading only Patient Visual Report: No change from baseline Vision Assessment?: No apparent visual deficits     Perception     Praxis      Pertinent Vitals/Pain Pain Assessment: Faces Faces Pain Scale: Hurts little more Pain Location: L shoulder Pain Descriptors / Indicators: Sore Pain Intervention(s):  Monitored during session;Limited activity within patient's tolerance     Hand Dominance Right   Extremity/Trunk Assessment Upper Extremity Assessment Upper Extremity Assessment: RUE deficits/detail;LUE deficits/detail RUE Deficits / Details: Shoulder AROM to 90 RUE Coordination: decreased fine motor LUE Deficits / Details: Shoulder AROM to 10-20 deg, limited functional use.  pt indicates Pain Lt shoulder - note that on  6/23, AROM WFL, but function was limited by pain  LUE Coordination: decreased fine motor;decreased gross motor   Lower Extremity Assessment Lower Extremity Assessment: Generalized weakness   Cervical / Trunk Assessment Cervical / Trunk Assessment: Kyphotic   Communication Communication Communication: HOH   Cognition Arousal/Alertness: Awake/alert Behavior During Therapy: WFL for tasks assessed/performed Overall Cognitive Status: Impaired/Different from baseline Area of Impairment: Safety/judgement;Problem solving;Awareness                         Safety/Judgement: Decreased awareness of safety;Decreased awareness of deficits Awareness: Intellectual Problem Solving: Slow processing;Difficulty sequencing;Requires verbal cues;Requires tactile cues General Comments: Pt scored 14 on the BIMS which indicates low indication of cognitive impairment.  However, she is slow to process information    General Comments  VSS.   Pt reports her son is no longer able to provide necessary level of assist.   She was provided with word search puzzle book and pen with built up handle     Exercises     Shoulder Instructions      Home Living Family/patient expects to be discharged to:: Private residence Living Arrangements: Children Available Help at Discharge: Family;Available 24 hours/day Type of Home: House Home Access: Stairs to enter [employer] of Steps: 6 Entrance Stairs-Rails: Can reach both Home Layout: One level     Bathroom Shower/Tub: Chief Strategy Officer: Handicapped height Bathroom Accessibility: No   Home Equipment: Environmental consultant - 4 wheels          Prior Functioning/Environment Level of Independence: Needs assistance  Gait / Transfers Assistance Needed: Per pt, she hasn't been out of bed much in the past week  ADL's / Homemaking Assistance Needed: Pt reports she hasn't been able to assist with ADLs for a few weeks, and either son or an aid was  assisting    Comments: Pt reports she has had difficulty rolling in bed and moving to EOB        OT Problem List: Decreased strength;Decreased activity tolerance;Impaired balance (sitting and/or standing);Decreased safety awareness;Pain      OT Treatment/Interventions:      OT Goals(Current goals can be found in the care plan section) Acute Rehab OT Goals Patient Stated Goal: to be able to take care of self  OT Goal Formulation: All assessment and education complete, DC therapy  OT Frequency:     Barriers to D/C: Decreased caregiver support          Co-evaluation              AM-PAC OT "6 Clicks" Daily Activity     Outcome Measure Help from another person eating meals?: A Little Help from another person taking care of personal grooming?: A Little Help from another person toileting, which includes using toliet, bedpan, or urinal?: Total Help from another person bathing (including washing, rinsing, drying)?: A Lot Help from another person to put on and taking off regular upper body clothing?: A Lot Help from another person to put on and taking off regular lower body clothing?: Total 6 Click Score: 12   End of Session  Nurse Communication: Mobility status  Activity Tolerance: Patient tolerated treatment well Patient left: in bed;with call bell/phone within reach  OT Visit Diagnosis: Unsteadiness on feet (R26.81) Pain - Right/Left: Left Pain - part of body: Shoulder                Time: 4034-7425 OT Time Calculation (min): 24 min Charges:  OT General Charges $OT Visit: 1 Visit OT Evaluation $OT Eval Moderate Complexity: 1 Mod OT Treatments $Therapeutic Activity: 8-22 mins  Jeani Hawking, OTR/L Acute Rehabilitation Services Pager [phone] Office [phone]   Jeani Hawking M 09/29/2018, 12:21 PM

## 2018-09-29 NOTE — ED Notes (Signed)
[place] pts son wants an update on pt

## 2018-09-29 NOTE — TOC Transition Note (Signed)
Transition of Care [facility]) - CM/SW Discharge Note   Patient Details  Name: Robin Moreno MRN: [id number] Date of Birth: 29-Jan-1926  Transition of Care [facility]) CM/SW Contact:  Fuller Mandril, RN Phone Number: 09/29/2018, 9:00 AM   Clinical Narrative:    [facility] consulted PT/OT to assess pt mobility, gait and assistance needed for ADLs.  Will await the recommendations before proceeding with disposition plan.     Barriers to Discharge: Continued Medical Work up   Patient Goals and CMS Choice Patient states their goals for this hospitalization and ongoing recovery are:: get moving again      Discharge Placement               [facility] vs SNF        Discharge Plan and Services In-house Referral: Clinical Social Work Discharge Planning Services: CM Consult                                 Social Determinants of Health (SDOH) Interventions     Readmission Risk Interventions No flowsheet data found.

## 2018-09-29 NOTE — ED Notes (Addendum)
PT at bedside. RN assisted PT with pt turning and repositioning. Pillows placed beneath ankles and behind left flank for support.

## 2018-09-29 NOTE — ED Notes (Signed)
Dinner tray ordered.

## 2018-09-29 NOTE — TOC Initial Note (Signed)
Transition of Care [facility]) - Initial/Assessment Note    Patient Details  Name: Robin Moreno MRN: [id number] Date of Birth: [birth year]  Transition of Care [facility]) CM/SW Contact:    Inis Sizer, LCSW Phone Number: 09/29/2018, 10:37 AM  Clinical Narrative:                 CSW spoke with patient's son [name] to complete assessment. [name] reports that up until recently, his mother was able to care for herself independently without much assistance at all. [name] reports that his mother experienced a small stroke and it impacted her mobility significantly. [name] reports that he and his mother live together and that [facility] services are in place but they have became less impactful due to the higher level of care his mother needs. CSW informed [name] that PT recommended SNF for his mother and he stated agreement. [name] reports that the patient is unable to turn herself over, walk, or do anything without maximum assistance. [name] reports that his mother has never been to a SNF or facility in the past. [name] reports not having a preference for facility just to get it in [place]. CSW explained process to [name] regarding placement including barriers of continued medical work up and English as a second language teacher. CSW informed [name] that he will be given a list of options to chose from once they are available. [name] agreed to plan and stated no questions.  CSW obtained PASSR number for patient which is [id number]. CSW will fax patient's information out to local facilities to obtain bed offers.  Expected Discharge Plan: Home/Self Care Barriers to Discharge: Insurance Authorization, Continued Medical Work up   Patient Goals and CMS Choice Patient states their goals for this hospitalization and ongoing recovery are:: Get moving again CMS Medicare.gov Compare Post Acute Care list provided to:: Patient Represenative (must comment)([name], son) Choice offered to / list presented to : Adult Children  Expected  Discharge Plan and Services Expected Discharge Plan: Home/Self Care In-house Referral: Clinical Social Work Discharge Planning Services: CM Consult   Living arrangements for the past 2 months: Single Family Home Expected Discharge Date: (unsure)                         HH Arranged: PT, Nurse's Aide HH Agency: [facility] Date [facility] Agency Contacted: 09/19/18   Representative spoke with at [facility] Agency: Kandee Keen  Prior Living Arrangements/Services Living arrangements for the past 2 months: Single Family Home Lives with:: Adult Children Patient language and need for interpreter reviewed:: No Do you feel safe going back to the place where you live?: Yes      Need for Family Participation in Patient Care: Yes (Comment) Care giver support system in place?: Yes (comment) Current home services: Home PT, Home RN Criminal Activity/Legal Involvement Pertinent to Current Situation/Hospitalization: No - Comment as needed  Activities of Daily Living Home Assistive Devices/Equipment: Eyeglasses, Bedside commode/3-in-1, Walker (specify type) ADL Screening (condition at time of admission) Patient's cognitive ability adequate to safely complete daily activities?: Yes Is the patient deaf or have difficulty hearing?: No Does the patient have difficulty seeing, even when wearing glasses/contacts?: Yes Does the patient have difficulty concentrating, remembering, or making decisions?: No Patient able to express need for assistance with ADLs?: Yes Does the patient have difficulty dressing or bathing?: Yes Independently performs ADLs?: No Communication: Independent Dressing (OT): Needs assistance Is this a change from baseline?: Change from baseline, expected to last >3 days Grooming: Dependent  Is this a change from baseline?: Change from baseline, expected to last >3 days Feeding: Independent Bathing: Dependent Is this a change from baseline?: Change from baseline, expected to last >3  days Toileting: Dependent Is this a change from baseline?: Change from baseline, expected to last >3days In/Out Bed: Dependent Is this a change from baseline?: Change from baseline, expected to last >3 days Walks in Home: Dependent Is this a change from baseline?: Change from baseline, expected to last >3 days Weakness of Legs: Both  Permission Sought/Granted Permission sought to share information with : Case Manager, Family Supports Permission granted to share information with : Yes, Release of Information Signed  Share Information with NAME: Lujean Ebright     Permission granted to share info w Relationship: Son  Permission granted to share info w Contact Information: [phone]  Emotional Assessment Appearance:: Appears stated age Attitude/Demeanor/Rapport: Gracious, Engaged Affect (typically observed): Accepting, Appropriate Orientation: : Oriented to Self, Oriented to Place, Oriented to  Time, Oriented to Situation Alcohol / Substance Use: Not Applicable Psych Involvement: No (comment)  Admission diagnosis:  sick Patient Active Problem List   Diagnosis Date Noted  . Viral gastroenteritis due to Sapporo agent 09/19/2018  . Acute kidney injury superimposed on CKD ([place]) 09/18/2018  . Diarrhea 09/18/2018  . Urinary tract infection 09/18/2018  . Generalized weakness 09/18/2018  . Arthritis 09/18/2018   PCP:  Jilda Panda, MD Pharmacy:   [facility] [address] [place] [address] Phone: [phone] Fax: [phone]  [facility] - [place], [place] - [address] [place] [address] Phone: [phone] Fax: [phone]  [facility] [place], [place] [address] Phone: [phone] Fax: [phone]     Social Determinants of Health (SDOH) Interventions     Readmission Risk Interventions No flowsheet data found.

## 2018-09-29 NOTE — ED Notes (Signed)
Lunch tray ordered 

## 2018-09-29 NOTE — ED Notes (Signed)
Patient transported to CT 

## 2018-09-29 NOTE — NC FL2 (Addendum)
[place] LEVEL OF CARE SCREENING TOOL     IDENTIFICATION  Patient Name: Robin Moreno Birthdate: Jul 28, 1925 Sex: female Admission Date (Current Location): 09/29/2018  [facility] and [place] Number:  Herbalist and Address:  [facility], [place] [address] [place], [place] [address]      Provider Number: [id number]  Attending Physician Name and Address:  Deno Etienne, DO  Relative Name and Phone Number:  Jose Alleyne [phone]    Current Level of Care: Hospital Recommended Level of Care: [place] Prior Approval Number:   [id number]  Date Approved/Denied:  09/29/2018 PASRR Number:    Discharge Plan: SNF    Current Diagnoses: Patient Active Problem List   Diagnosis Date Noted  . Viral gastroenteritis due to Sapporo agent 09/19/2018  . Acute kidney injury superimposed on CKD ([place]) 09/18/2018  . Diarrhea 09/18/2018  . Urinary tract infection 09/18/2018  . Generalized weakness 09/18/2018  . Arthritis 09/18/2018    Orientation RESPIRATION BLADDER Height & Weight     Place, Situation, Time, Self  Normal Incontinent Weight: 143 lb (64.9 kg) Height:  5' (152.4 cm)  BEHAVIORAL SYMPTOMS/MOOD NEUROLOGICAL BOWEL NUTRITION STATUS      Incontinent Diet  AMBULATORY STATUS COMMUNICATION OF NEEDS Skin   Extensive Assist Verbally Normal                       Personal Care Assistance Level of Assistance  Dressing, Bathing, Feeding Bathing Assistance: Maximum assistance Feeding assistance: Independent Dressing Assistance: Limited assistance     Functional Limitations Info  Sight, Speech, Hearing Sight Info: Adequate Hearing Info: Adequate Speech Info: Adequate    SPECIAL CARE FACTORS FREQUENCY  OT (By licensed OT), PT (By licensed PT)     PT Frequency: 5x weekly OT Frequency: 5x weekly            Contractures Contractures Info: Not present    Additional Factors Info                  Current  Medications (09/29/2018):  This is the current hospital active medication list No current facility-administered medications for this encounter.    Current Outpatient Medications  Medication Sig Dispense Refill  . aspirin EC 325 MG tablet Take 1 tablet (325 mg total) by mouth daily. 30 tablet 1  . Cholecalciferol (VITAMIN D) 2000 UNITS tablet Take 2,000 Units by mouth daily.      Marland Kitchen diltiazem (CARDIZEM) 60 MG tablet Take 60 mg by mouth 2 (two) times daily.      . enalapril (VASOTEC) 10 MG tablet Take 10 mg by mouth daily.  4  . gabapentin (NEURONTIN) 300 MG capsule Take 300 mg (1 capsule) twice daily the first day and then begin 300 mg (1 capsule) 3 times daily thereafter. (Patient taking differently: Take 300 mg by mouth 3 (three) times daily. Take 300 mg (1 capsule) twice daily the first day and then begin 300 mg (1 capsule) 3 times daily thereafter.) 30 capsule 0  . hydrochlorothiazide (HYDRODIURIL) 25 MG tablet Take 25 mg by mouth daily.      . meclizine (ANTIVERT) 12.5 MG tablet Take 1 tablet (12.5 mg total) by mouth 3 (three) times daily as needed for dizziness. 30 tablet 0  . Menthol-Methyl Salicylate (MUSCLE RUB) 10-15 % CREA Apply 1 application topically as needed (for sore muscle).    . Multiple Vitamin (MULTIVITAMIN WITH MINERALS) TABS Take 1 tablet by mouth daily.    Marland Kitchen  Polyethyl Glycol-Propyl Glycol (SYSTANE OP) Place 1 drop into both eyes every morning.    . rosuvastatin (CRESTOR) 10 MG tablet Take 10 mg by mouth daily.    . diclofenac sodium (VOLTAREN) 1 % GEL Apply 4 g topically 4 (four) times daily. Apply to your left shoulder (Patient not taking: Reported on 09/18/2018) 100 g 0  . oxyCODONE (ROXICODONE) 5 MG immediate release tablet Take 0.5 tablets (2.5 mg total) by mouth every 6 (six) hours as needed for severe pain. (Patient not taking: Reported on 09/18/2018) 6 tablet 0     Discharge Medications: Please see discharge summary for a list of discharge medications.  Relevant  Imaging Results:  Relevant Lab Results:   Additional Information SSN: [id number]  Inis Sizer, LCSW

## 2018-09-29 NOTE — ED Notes (Signed)
Report given to Mike, RN

## 2018-09-29 NOTE — ED Notes (Signed)
[place] pts sister would like an update on pt please

## 2018-09-29 NOTE — Evaluation (Addendum)
Physical Therapy Evaluation Patient Details Name: Robin Moreno MRN: [id number] DOB: 1925/06/19 Today's Date: 09/29/2018   History of Present Illness  Robin Moreno is a 85 y.o. female with medical history significant of HTN, HLD, DDD, osteoarthritis, and per patient rheumatoid arthritis; who presents with complaints of joint pain, diarrhea, debility.  Clinical Impression  Pt presents with above with recent hospital admit. Pt lives with her son but reports steady decline in mobility over past few weeks, requiring assist with all ADL's and IADL's. Presents with decreased functional mobility secondary to left shoulder pain, decreased functional use of arms, generalized weakness, balance impairments, decreased activity tolerance. Requiring maximal assist to stand, ambulating 6 feet with walker. Recommending SNF at discharge to maximize functional independence and decrease caregiver burden.     Follow Up Recommendations Supervision/Assistance - 24 hour;SNF    Equipment Recommendations  3in1 (PT);Hospital bed;Wheelchair (measurements PT)    Recommendations for Other Services       Precautions / Restrictions Precautions Precautions: Fall Restrictions Weight Bearing Restrictions: No      Mobility  Bed Mobility Overal bed mobility: Needs Assistance Bed Mobility: Supine to Sit     Supine to sit: Mod assist Sit to supine: Mod assist   General bed mobility comments: Pt requiring heavy modA to sit upright in bed and bring BLE's back into bed  Transfers Overall transfer level: Needs assistance Equipment used: Rolling walker (2 wheeled) Transfers: Sit to/from Stand Sit to Stand: Max assist         General transfer comment: MaxA to stand from edge of bed after x 2 attempts, cues for use of momentum and hand placement  Ambulation/Gait Ambulation/Gait assistance: Min assist Gait Distance (Feet): 6 Feet Assistive device: Rolling walker (2 wheeled) Gait Pattern/deviations: Step-to  pattern;Decreased stride length;Trunk flexed Gait velocity: slow Gait velocity interpretation: <1.31 ft/sec, indicative of household ambulator General Gait Details: very slow, trunk flexed posture. Uncontrolled eccentric descent  Stairs            Wheelchair Mobility    Modified Rankin (Stroke Patients Only)       Balance Overall balance assessment: Needs assistance Sitting-balance support: Feet supported;No upper extremity supported Sitting balance-Leahy Scale: Fair     Standing balance support: Bilateral upper extremity supported Standing balance-Leahy Scale: Poor Standing balance comment: pt dependent on physical assist and RW for safe standing balance                             Pertinent Vitals/Pain Pain Assessment: Faces Faces Pain Scale: Hurts little more Pain Location: L shoulder Pain Descriptors / Indicators: Sore Pain Intervention(s): Monitored during session    Home Living Family/patient expects to be discharged to:: Private residence Living Arrangements: Children Available Help at Discharge: Family;Available 24 hours/day Type of Home: House Home Access: Stairs to enter Entrance Stairs-Rails: Can reach both Entrance Stairs-Number of Steps: 6 Home Layout: One level Home Equipment: Walker - 4 wheels      Prior Function Level of Independence: Needs assistance   Gait / Transfers Assistance Needed: amb short distances with rollator  ADL's / Homemaking Assistance Needed: sponge bathing, son assist with transfer into bathroom because walker doesn't fit  Comments: pt was doing simple meal prep but hasn't in a few weeks, now requiring assist with all ADL's/IADL's     Hand Dominance   Dominant Hand: Right    Extremity/Trunk Assessment   Upper Extremity Assessment Upper Extremity Assessment: LUE deficits/detail;RUE  deficits/detail RUE Deficits / Details: Shoulder AROM to 90 LUE Deficits / Details: Shoulder AROM to 10-20 deg, limited  functional use    Lower Extremity Assessment Lower Extremity Assessment: Generalized weakness    Cervical / Trunk Assessment Cervical / Trunk Assessment: Kyphotic  Communication   Communication: HOH  Cognition Arousal/Alertness: Awake/alert Behavior During Therapy: WFL for tasks assessed/performed Overall Cognitive Status: Impaired/Different from baseline Area of Impairment: Safety/judgement;Problem solving;Awareness                         Safety/Judgement: Decreased awareness of safety;Decreased awareness of deficits Awareness: Intellectual Problem Solving: Slow processing;Difficulty sequencing;Requires verbal cues;Requires tactile cues        General Comments      Exercises     Assessment/Plan    PT Assessment Patient needs continued PT services  PT Problem List Decreased strength;Decreased range of motion;Decreased activity tolerance;Decreased balance;Decreased mobility;Decreased coordination;Decreased cognition;Decreased knowledge of use of DME;Decreased safety awareness;Decreased knowledge of precautions       PT Treatment Interventions DME instruction;Gait training;Stair training;Functional mobility training;Therapeutic activities;Therapeutic exercise;Balance training;Neuromuscular re-education;Cognitive remediation    PT Goals (Current goals can be found in the Care Plan section)  Acute Rehab PT Goals Patient Stated Goal: less pain in left shoulder PT Goal Formulation: With patient Time For Goal Achievement: 10/13/18 Potential to Achieve Goals: Fair    Frequency Min 2X/week   Barriers to discharge        Co-evaluation               AM-PAC PT "6 Clicks" Mobility  Outcome Measure Help needed turning from your back to your side while in a flat bed without using bedrails?: A Lot Help needed moving from lying on your back to sitting on the side of a flat bed without using bedrails?: A Lot Help needed moving to and from a bed to a chair  (including a wheelchair)?: A Little Help needed standing up from a chair using your arms (e.g., wheelchair or bedside chair)?: Total Help needed to walk in hospital room?: A Lot Help needed climbing 3-5 steps with a railing? : A Lot 6 Click Score: 12    End of Session Equipment Utilized During Treatment: Gait belt Activity Tolerance: Patient tolerated treatment well Patient left: with call bell/phone within reach;in bed   PT Visit Diagnosis: Muscle weakness (generalized) (M62.81);Difficulty in walking, not elsewhere classified (R26.2)    Time: 0920-0950 PT Time Calculation (min) (ACUTE ONLY): 30 min   Charges:   PT Evaluation $PT Eval Moderate Complexity: 1 Mod PT Treatments $Therapeutic Activity: 8-22 mins        Ellamae Sia, PT, DPT Acute Rehabilitation Services Pager [phone] Office [phone]   Willy Eddy 09/29/2018, 10:39 AM

## 2018-09-29 NOTE — ED Triage Notes (Signed)
BIB [facility] from home with c/o of fatigue  x 2 days. Also c/o of joint pain but has a hx of arthritis. Hx of left shoulder fx in healing stages. Pt hard of hearing on right side. Last seen here for dehydration and kidney infection.

## 2018-09-29 NOTE — ED Notes (Signed)
Son updated on phone.

## 2018-09-29 NOTE — Care Management (Addendum)
Patient awaiting available bed as per handoff from [place].  TOC CSW will follow up tomorrow morning 7/4.

## 2018-09-29 NOTE — ED Notes (Signed)
Left note on social worker's desk to call family after speaking with them this evening

## 2018-09-30 DIAGNOSIS — M791 Myalgia, unspecified site: Secondary | ICD-10-CM | POA: Diagnosis not present

## 2018-09-30 LAB — SARS CORONAVIRUS 2 BY RT PCR (HOSPITAL ORDER, PERFORMED IN ~~LOC~~ HOSPITAL LAB): SARS Coronavirus 2: NEGATIVE

## 2018-09-30 MED ORDER — DILTIAZEM HCL 60 MG PO TABS
60.0000 mg | ORAL_TABLET | Freq: Two times a day (BID) | ORAL | Status: DC
  Administered 2018-09-30 – 2018-10-03 (×5): 60 mg via ORAL
  Filled 2018-09-30 (×7): qty 1

## 2018-09-30 MED ORDER — ASPIRIN EC 325 MG PO TBEC
325.0000 mg | DELAYED_RELEASE_TABLET | Freq: Every day | ORAL | Status: DC
  Administered 2018-09-30 – 2018-10-03 (×4): 325 mg via ORAL
  Filled 2018-09-30 (×4): qty 1

## 2018-09-30 MED ORDER — GABAPENTIN 300 MG PO CAPS
300.0000 mg | ORAL_CAPSULE | Freq: Three times a day (TID) | ORAL | Status: DC
  Administered 2018-09-30 – 2018-10-03 (×8): 300 mg via ORAL
  Filled 2018-09-30 (×8): qty 1

## 2018-09-30 MED ORDER — DICLOFENAC SODIUM 1 % TD GEL
4.0000 g | Freq: Four times a day (QID) | TRANSDERMAL | Status: DC
  Administered 2018-09-30 – 2018-10-03 (×9): 4 g via TOPICAL
  Filled 2018-09-30: qty 100

## 2018-09-30 MED ORDER — VITAMIN D 25 MCG (1000 UNIT) PO TABS
2000.0000 [IU] | ORAL_TABLET | Freq: Every day | ORAL | Status: DC
  Administered 2018-09-30 – 2018-10-03 (×4): 2000 [IU] via ORAL
  Filled 2018-09-30 (×4): qty 2

## 2018-09-30 MED ORDER — POLYVINYL ALCOHOL 1.4 % OP SOLN
Freq: Every morning | OPHTHALMIC | Status: DC
  Administered 2018-10-01: 10:00:00 1 [drp] via OPHTHALMIC
  Administered 2018-10-02 – 2018-10-03 (×2): via OPHTHALMIC
  Filled 2018-09-30: qty 15

## 2018-09-30 MED ORDER — ENALAPRIL MALEATE 10 MG PO TABS
10.0000 mg | ORAL_TABLET | Freq: Every day | ORAL | Status: DC
  Administered 2018-09-30 – 2018-10-03 (×4): 10 mg via ORAL
  Filled 2018-09-30 (×4): qty 1

## 2018-09-30 MED ORDER — ROSUVASTATIN CALCIUM 5 MG PO TABS
10.0000 mg | ORAL_TABLET | Freq: Every day | ORAL | Status: DC
  Administered 2018-09-30 – 2018-10-03 (×4): 10 mg via ORAL
  Filled 2018-09-30 (×4): qty 2

## 2018-09-30 MED ORDER — ADULT MULTIVITAMIN W/MINERALS CH
1.0000 | ORAL_TABLET | Freq: Every day | ORAL | Status: DC
  Administered 2018-09-30 – 2018-10-03 (×4): 1 via ORAL
  Filled 2018-09-30 (×4): qty 1

## 2018-09-30 MED ORDER — HYDROCHLOROTHIAZIDE 25 MG PO TABS
25.0000 mg | ORAL_TABLET | Freq: Every day | ORAL | Status: DC
  Administered 2018-09-30 – 2018-10-03 (×4): 25 mg via ORAL
  Filled 2018-09-30 (×4): qty 1

## 2018-09-30 NOTE — Progress Notes (Addendum)
CSW met with patient to discuss transfer to [facility] (as they were only SNF that offered a bed). CSW noted patient presented confused and kept repeating the same questions. CSW attempted to facilitate phone conference with her sister and brother and noted she refused as she had no brother or sister. CSW will continue to support SNF discharge.   11:30: CSW left voicemail with Kurstin at [place] and attempted [place] with [facility] admissions (no v/m box) to discuss discharge.   Lamonte Richer, LCSW, [place] Worker II [phone]

## 2018-09-30 NOTE — ED Notes (Signed)
Pt on bedpan as requested d/t states feels as if she needs to have BM. Pt stated she felt too weak for bedside commode.

## 2018-09-30 NOTE — Progress Notes (Signed)
CSW received call back from [place]. Per [facility] admissions they can take patient Monday pending authorization.   Lamonte Richer, LCSW, [place] Worker II [phone]

## 2018-09-30 NOTE — ED Notes (Signed)
CSW at bedside.

## 2018-09-30 NOTE — ED Notes (Signed)
Pt arrived to Rm 48 via bed. PureWick intact to pt. Pt noted to be alert, calm, and cooperative. Pt spoke w/her granddaughter and son on phone.

## 2018-09-30 NOTE — ED Notes (Addendum)
Per 09/29/2018 note - Sister Hoyle Sauer [phone]

## 2018-09-30 NOTE — ED Provider Notes (Signed)
85 yo F with a chief complaint of inability to take care of herself at home.  She was dropped off last night by a family member with hope that she would be admitted or placed in a nursing home.  Plan is to wait for a urine and a second troponin if these are negative then she likely needs evaluation by social work for placement.  UA is negative for infection.  Delta is negative for atypical ACS presentation.  Social work working on placement.  PT OT orders placed.   Deno Etienne, DO 09/30/18 1114

## 2018-09-30 NOTE — ED Provider Notes (Signed)
Home meds re-ordered based on med rec on chart.    Lorin Glass, PA-C 09/30/18 1818    Gareth Morgan, MD 10/06/18 1357

## 2018-09-30 NOTE — ED Notes (Signed)
Requested APP to order home meds.

## 2018-10-01 DIAGNOSIS — M791 Myalgia, unspecified site: Secondary | ICD-10-CM | POA: Diagnosis not present

## 2018-10-01 NOTE — ED Provider Notes (Signed)
Emergency Medicine Observation Re-evaluation Note  Robin Moreno is a 85 y.o. female, seen on rounds today.  Pt initially presented to the ED for complaints of Fatigue Currently, the patient is awaiting placement, awake, watching TV.  Physical Exam  BP (!) 112/52 (BP Location: Left Arm)   Pulse 74   Temp 97.7 F (36.5 C) (Oral)   Resp 18   Ht 5' (1.524 m)   Wt 64.9 kg   SpO2 98%   BMI 27.93 kg/m  Physical Exam Alert, respirations even and unlabored, abdomen soft and non tender. Equal leg strength. ED Course / MDM  EKG:EKG Interpretation  Date/Time:  Friday September 29 2018 05:26:44 EDT Ventricular Rate:  68 PR Interval:    QRS Duration: 92 QT Interval:  394 QTC Calculation: 419 R Axis:   13 Text Interpretation:  Sinus rhythm Prolonged PR interval Abnormal R-wave progression, early transition Borderline T abnormalities, inferior leads No significant change since last tracing today or june 22 Confirmed by Merrily Pew [phone]) on 09/29/2018 5:30:07 AM    I have reviewed the labs performed to date as well as medications administered while in observation.  Recent changes in the last 24 hours include urinary output, noted to have 740 mL's on bladder scanner.  Patient has been constipated, was given a stool softener.. Plan  Current plan is for in and out catheter if patient is unable to void.  Consider enema if patient remains constipated.  If patient remains unable to void consider indwelling Foley catheter. 11:12AM informed by patient's nurse patient has voided 200 mL's, will continue to monitor. 2:34PM I&O with 477mL output. I&O cath ordered q4h PRN x 3, may need foley after 3 I&O caths.   Tacy Learn, PA-C 10/01/18 1435    Quintella Reichert, MD 10/01/18 [phone]

## 2018-10-01 NOTE — ED Notes (Signed)
Patient's son called to speak with patient-Monique,RN

## 2018-10-01 NOTE — ED Notes (Signed)
Patient still remains dry and peri wick in place; pt did not have enough urine in bladder after scan to I&O; RN will check status of output on morning rounds and possibly I&O cath at that time; Patient has been provided with fluids but several cups full remain on side table; Pt was encouraged to drink fluids when [facility]

## 2018-10-01 NOTE — ED Notes (Signed)
Pt on phone w/son. Pt c/o "I haven't had a bowel movement since I've been here".

## 2018-10-01 NOTE — ED Notes (Addendum)
In and out cath performed by Vivi, NT and D Jones, NT - Pt tolerated well. 432ml output. Pure Wick intact.

## 2018-10-01 NOTE — ED Notes (Signed)
Pt c/o unable to void - 774ml urine noted per bladder scan. Pt also c/o constipation - Miralax given. States she also takes stool softeners.

## 2018-10-01 NOTE — ED Notes (Addendum)
Pt unable to void on bedpan. In and Out Cath performed by Vivi, NT, and D Jones, NT - Pt tolerated well. 1110ml output noted.

## 2018-10-01 NOTE — ED Notes (Signed)
Pt eating breakfast - PureWick intact to pt.

## 2018-10-01 NOTE — ED Notes (Addendum)
Jasper Loser, PA, aware - pt voided 259ml and post-void 478ml.

## 2018-10-01 NOTE — ED Notes (Addendum)
Pt's son had brought pt's dentures - Pt removed from denture cup and placed upper and lower dentures in her mouth. States she is "just going to leave them in". Denture cup placed in pt's belongings bag as requested by pt. Pt finishing eating lunch.

## 2018-10-01 NOTE — ED Notes (Signed)
Jasper Loser, PA, aware, per J Blue-Matthews, Charge RN, pt will need to have in & out caths x 3 performed every 4 hours or so if urinary retention remains then may insert f/c if needed. Orders received for In and Outs.

## 2018-10-01 NOTE — ED Notes (Signed)
Pt attempted to void on bedpan - unsuccessful.

## 2018-10-02 DIAGNOSIS — M791 Myalgia, unspecified site: Secondary | ICD-10-CM | POA: Diagnosis not present

## 2018-10-02 NOTE — ED Notes (Signed)
Patient denies pain and is resting comfortably.  

## 2018-10-02 NOTE — Progress Notes (Addendum)
2nd shift ED CSW received a handoff from the 1st shift [facility] ED CSW.   CSW called Winona Legato in admissions to request an update on pt's pending insurance auth but was unable to get through and left a HIPA-compliant VM requesting a call back.  CSW will continue to follow for D/C needs.  Alphonse Guild. Mykale Gandolfo, LCSW, LCAS, CSI Transitions of Care Clinical Social Worker Care Coordination Department Ph: [phone]

## 2018-10-02 NOTE — ED Notes (Signed)
Lunch ordered 

## 2018-10-02 NOTE — Progress Notes (Signed)
CSW spoke with Kurstin at [facility] who stated that she is still waiting on insurance authorization for this patient. Kurstin will return call to [place] whenever authorization has been obtained.  Madilyn Fireman, MSW, LCSW-A Clinical Social Worker [facility] [phone]

## 2018-10-02 NOTE — ED Notes (Signed)
Pt. Doesn't haven't any urge to urinate, bladder scan revealed 65 ml of urine at this time.

## 2018-10-03 DIAGNOSIS — M791 Myalgia, unspecified site: Secondary | ICD-10-CM | POA: Diagnosis not present

## 2018-10-03 NOTE — ED Provider Notes (Signed)
85 year old female seen today on rounds today.  Patient is currently evaluated by physical therapy who notes left knee pain, history of arthritis, slight effusion noted.  Skin is intact, non-erythematous.  Patient has osteoarthritis in this knee, states she normally wears a knee brace on this knee.  Plan is to order a knee sleeve.  Patient has been accepted to [facility].  Paperwork provided.   Tacy Learn, PA-C 10/03/18 1234    Charlesetta Shanks, MD 10/08/18 [phone]

## 2018-10-03 NOTE — Discharge Planning (Signed)
Clinical Social Work is seeking post-discharge placement for this patient at the following level of care: SNF. Expects to receive authorization to go to [facility] today.

## 2018-10-03 NOTE — ED Notes (Addendum)
Will wait for the I&O cath per text with md. Will rescan patient in a few hours to check bladder. Pt denies pain or urge to void.

## 2018-10-03 NOTE — ED Notes (Signed)
[facility] has arrived to transport pt. ALL belongings at bedside w/[facility]. Pt wearing upper and lower dentures.

## 2018-10-03 NOTE — ED Notes (Signed)
Attempted to call report to [facility] x 3 - no answer - was advised nurses are at lunch.

## 2018-10-03 NOTE — ED Notes (Addendum)
Attempted to call report to [facility] - Jan aware [facility] will be arriving soon to transport pt - advised she will give this RN's name and number to DON and request or her to call.

## 2018-10-03 NOTE — ED Notes (Signed)
Attempted to call report to [facility] x 2 more times - no answer.

## 2018-10-03 NOTE — ED Notes (Signed)
Offered pt snack and drink to patient.

## 2018-10-03 NOTE — ED Notes (Signed)
Attempted to call report x 2 - unsuccessful. Arbie Cookey, SW, assisting.

## 2018-10-03 NOTE — TOC Transition Note (Signed)
Transition of Care [facility]) - CM/SW Discharge Note   Patient Details  Name: Robin Moreno MRN: [id number] Date of Birth: [birth year]  [facility]) CM/SW Contact:  Archie Endo, LCSW Phone Number: 10/03/2018, 1:09 PM   Clinical Narrative:     CSW attempted to reach patient's son [name] at [phone] without success to notify him of patient's discharge plan to [facility]. CSW left voicemail with details of transfer and how to contact the facility. CSW spoke with Jacqlyn Larsen, RN to confirm plan. Patient is going to room [address]P on [address]. The number to call for report is [phone]. CSW arranged for transportation to [facility] via [place] at 2pm.   Final next level of care: [place] Barriers to Discharge: No Barriers Identified   Patient Goals and CMS Choice Patient states their goals for this hospitalization and ongoing recovery are:: Get moving again CMS Medicare.gov Compare Post Acute Care list provided to:: Patient Represenative (must comment) Choice offered to / list presented to : Adult Children  Discharge Placement                Patient to be transferred to facility by: [place] Name of family member notified: Llewellyn Schoenberger [phone] Patient and family notified of of transfer: 10/03/18  Discharge Plan and Services In-house Referral: Clinical Social Work Discharge Planning Services: CM Consult                      HH Arranged: PT, Nurse's Aide [place] Agency: [place] Date [place]: 09/19/18   Representative spoke with at [place]: [place] ([place]) Interventions     Readmission Risk Interventions No flowsheet data found.

## 2018-10-03 NOTE — Progress Notes (Signed)
Pt was seen for transfers and due to limitations of L knee today with effusion and warmth, was not able to take steps with RW.  Pt is expected to go to SNF today, will anticipate her need to rehabilitate strength, manage pain and work on proprioceptive control of LLE with AD.  Follow up acutely if her dc is delayed.   10/03/18 1200  PT Visit Information  Last PT Received On 10/03/18  Assistance Needed +2 (2 person assist to stand with control due to L knee pain)  History of Present Illness Robin Moreno is a 85 y.o. female with medical history significant of HTN, HLD, DDD, osteoarthritis, and per patient rheumatoid arthritis; who presents with complaints of joint pain, diarrhea, debility.  Subjective Data  Patient Stated Goal to be able to take care of self   Precautions  Precautions Fall  Precaution Comments [facility]  Restrictions  Weight Bearing Restrictions No  Pain Assessment  Pain Assessment Faces  Faces Pain Scale 6  Pain Location L knee  Pain Descriptors / Indicators Aching;Tender  Pain Intervention(s) Limited activity within patient's tolerance;Monitored during session;Premedicated before session;Repositioned;Other (comment) (NP stepped in to evaluate pt and noted her joint effusion)  Cognition  Arousal/Alertness Awake/alert  Behavior During Therapy WFL for tasks assessed/performed  Overall Cognitive Status Impaired/Different from baseline  Area of Impairment Following commands;Awareness;Problem solving  Following Commands Follows one step commands inconsistently;Follows one step commands with increased time  Safety/Judgement Decreased awareness of safety;Decreased awareness of deficits  Awareness Intellectual  Problem Solving Slow processing;Requires verbal cues;Requires tactile cues  Bed Mobility  Overal bed mobility Needs Assistance  Bed Mobility Rolling;Supine to Sit;Sit to Supine  Rolling Mod assist  Supine to sit Max assist  Sit to supine Total assist  General bed  mobility comments pt is not moving legs or UE's well, very poor effort to move on the bed  Transfers  Overall transfer level Needs assistance  Equipment used Rolling walker (2 wheeled);1 person hand held assist  Transfers Sit to/from Stand  Sit to Stand Max assist;From elevated surface  General transfer comment poor ability to assist initially then helped to stand and remained up for more than a minute  Ambulation/Gait  General Gait Details unable to tolerate steps due to L knee effusion and pain  Balance  Overall balance assessment Needs assistance  Sitting-balance support Feet supported  Sitting balance-Leahy Scale Fair  Standing balance support Bilateral upper extremity supported;During functional activity  Standing balance-Leahy Scale Poor  General Comments  General comments (skin integrity, edema, etc.) pt is in bed covered up and cold and had not asked nursing to assist with this.  PT - End of Session  Activity Tolerance Patient limited by fatigue;Patient limited by pain  Patient left in bed;with call bell/phone within reach  Nurse Communication Mobility status   PT - Assessment/Plan  PT Plan Current plan remains appropriate  PT Visit Diagnosis Muscle weakness (generalized) (M62.81);Difficulty in walking, not elsewhere classified (R26.2)  PT Frequency (ACUTE ONLY) Min 2X/week  Follow Up Recommendations Supervision/Assistance - 24 hour;SNF  PT equipment 3in1 (PT);Hospital bed;Wheelchair (measurements PT)  AM-PAC PT "6 Clicks" Mobility Outcome Measure (Version 2)  Help needed turning from your back to your side while in a flat bed without using bedrails? 2  Help needed moving from lying on your back to sitting on the side of a flat bed without using bedrails? 2  Help needed moving to and from a bed to a chair (including a wheelchair)? 2  Help needed standing up from a chair using your arms (e.g., wheelchair or bedside chair)? 2  Help needed to walk in hospital room? 2  Help  needed climbing 3-5 steps with a railing?  1  6 Click Score 11  Consider Recommendation of Discharge To: CIR/SNF/LTACH  PT Goal Progression  Progress towards PT goals Not progressing toward goals - comment  PT Time Calculation  PT Start Time (ACUTE ONLY) 1159  PT Stop Time (ACUTE ONLY) 1226  PT Time Calculation (min) (ACUTE ONLY) 27 min  PT General Charges  $$ ACUTE PT VISIT 1 Visit  PT Treatments  $Therapeutic Activity 23-37 mins    Samul Dada, PT MS Acute Rehab Dept. Number: [facility] [phone] and [facility] [phone]

## 2018-10-03 NOTE — ED Notes (Signed)
Son, Robin Moreno, aware pt will be transported to [facility] around 1400. Talking w/pt on phone.

## 2018-10-03 NOTE — Progress Notes (Signed)
Orthopedic Tech Progress Note Patient Details:  Robin Moreno [birth year] [id number]  Ortho Devices Type of Ortho Device: Knee Sleeve Ortho Device/Splint Interventions: Application   Post Interventions Patient Tolerated: Well   [place] Pink 10/03/2018, 1:18 PM

## 2018-10-03 NOTE — ED Notes (Signed)
Bladder scan 213. Purewick in place. No urine. No urge to void. Instructed to do I&O cath every 4-6 hours. Per md note, if retention continues we may place a foley cath. Will text md to see if that's what they want.

## 2018-10-03 NOTE — ED Notes (Signed)
AVS faxed to [facility] 

## 2018-10-03 NOTE — ED Notes (Signed)
Pt urinated 464mls. [place] working well. Pt has no complaints at this time.

## 2018-10-03 NOTE — ED Notes (Signed)
Breakfast tray ordered 

## 2018-10-03 NOTE — Progress Notes (Signed)
CSW spoke with Kurstin at [facility] who stated that insurance authorization is still pending but is expected to be obtained today. Kurstin to notify CSW as soon as it is available.  Madilyn Fireman, MSW, LCSW-A Clinical Social Worker [facility] [phone]

## 2018-10-09 ENCOUNTER — Telehealth: Payer: Self-pay

## 2018-10-09 NOTE — Telephone Encounter (Signed)
I called Robin Moreno at [facility] that pts appt with  Dr.SEthi will be video due to [name] stated she call and spoke with [name] on 10/04/2018 and was sent link for video visit on Thursday. I advise Robin Moreno to click link 10 minutes prior to appt. She verbalized understanding.

## 2018-10-09 NOTE — Telephone Encounter (Signed)
I called [facility] to schedule video visit for pt due to [place] 19 pandemic.THey stated to call back at 0930 and speak with Marguerite Olea who does all the scheduling for pts appts to set up the video. Contact number is [phone].

## 2018-10-12 ENCOUNTER — Ambulatory Visit: Payer: Medicare Other | Admitting: Neurology

## 2018-10-12 ENCOUNTER — Other Ambulatory Visit: Payer: Self-pay

## 2018-10-12 NOTE — Telephone Encounter (Addendum)
Pt was not on doxy waiting list per Dr. Leonie Man. We sent the link again to the [place] email. I called [facility] several times and couldn't get in touch with anyone. Pt will need to be r/s.   [facility] and left a secure vm for Marguerite Olea asking for a call back to r/s the pt's virtual appt with Dr. Leonie Man. I advised in the message that we had tried to call earlier today and also sent the link again for the appt that was scheduled today. Left office number in message for call back.

## 2019-01-04 ENCOUNTER — Other Ambulatory Visit: Payer: Self-pay

## 2019-01-04 ENCOUNTER — Emergency Department (HOSPITAL_COMMUNITY)
Admission: EM | Admit: 2019-01-04 | Discharge: 2019-01-04 | Disposition: A | Discharge status: Home / Self Care | Payer: Medicare Other | Attending: Emergency Medicine | Admitting: Emergency Medicine

## 2019-01-04 ENCOUNTER — Emergency Department (HOSPITAL_COMMUNITY): Payer: Medicare Other

## 2019-01-04 DIAGNOSIS — I10 Essential (primary) hypertension: Secondary | ICD-10-CM | POA: Diagnosis not present

## 2019-01-04 DIAGNOSIS — Z7982 Long term (current) use of aspirin: Secondary | ICD-10-CM | POA: Diagnosis not present

## 2019-01-04 DIAGNOSIS — R42 Dizziness and giddiness: Secondary | ICD-10-CM | POA: Insufficient documentation

## 2019-01-04 DIAGNOSIS — R519 Headache, unspecified: Secondary | ICD-10-CM | POA: Diagnosis not present

## 2019-01-04 DIAGNOSIS — Z96649 Presence of unspecified artificial hip joint: Secondary | ICD-10-CM | POA: Insufficient documentation

## 2019-01-04 LAB — CBC WITH DIFFERENTIAL/PLATELET
Abs Immature Granulocytes: 0.03 10*3/uL (ref 0.00–0.07)
Basophils Absolute: 0 10*3/uL (ref 0.0–0.1)
Basophils Relative: 1 %
Eosinophils Absolute: 0.1 10*3/uL (ref 0.0–0.5)
Eosinophils Relative: 2 %
HCT: 38.5 % (ref 36.0–46.0)
Hemoglobin: 12 g/dL (ref 12.0–15.0)
Immature Granulocytes: 1 %
Lymphocytes Relative: 32 %
Lymphs Abs: 1.8 10*3/uL (ref 0.7–4.0)
MCH: 28.9 pg (ref 26.0–34.0)
MCHC: 31.2 g/dL (ref 30.0–36.0)
MCV: 92.8 fL (ref 80.0–100.0)
Monocytes Absolute: 0.5 10*3/uL (ref 0.1–1.0)
Monocytes Relative: 10 %
Neutro Abs: 3 10*3/uL (ref 1.7–7.7)
Neutrophils Relative %: 54 %
Platelets: 378 10*3/uL (ref 150–400)
RBC: 4.15 MIL/uL (ref 3.87–5.11)
RDW: 14.2 % (ref 11.5–15.5)
WBC: 5.5 10*3/uL (ref 4.0–10.5)
nRBC: 0 % (ref 0.0–0.2)

## 2019-01-04 LAB — BASIC METABOLIC PANEL
Anion gap: 11 (ref 5–15)
BUN: 18 mg/dL (ref 8–23)
CO2: 22 mmol/L (ref 22–32)
Calcium: 9.3 mg/dL (ref 8.9–10.3)
Chloride: 102 mmol/L (ref 98–111)
Creatinine, Ser: 1.06 mg/dL — ABNORMAL HIGH (ref 0.44–1.00)
GFR calc Af Amer: 53 mL/min — ABNORMAL LOW (ref >60)
GFR calc non Af Amer: 46 mL/min — ABNORMAL LOW (ref >60)
Glucose, Bld: 85 mg/dL (ref 70–99)
Potassium: 5.3 mmol/L — ABNORMAL HIGH (ref 3.5–5.1)
Sodium: 135 mmol/L (ref 135–145)

## 2019-01-04 LAB — SEDIMENTATION RATE: Sed Rate: 96 mm/hr — ABNORMAL HIGH (ref 0–22)

## 2019-01-04 MED ORDER — ACETAMINOPHEN 325 MG PO TABS: 650.0000 mg | ORAL_TABLET | Freq: Four times a day (QID) | ORAL | 0 refills | Status: DC | PRN

## 2019-01-04 MED ORDER — ACETAMINOPHEN 325 MG PO TABS
650.0000 mg | ORAL_TABLET | Freq: Once | ORAL | Status: AC
  Administered 2019-01-04: 650 mg via ORAL
  Filled 2019-01-04: qty 2

## 2019-01-04 MED ORDER — METOCLOPRAMIDE HCL 5 MG/ML IJ SOLN
10.0000 mg | Freq: Once | INTRAMUSCULAR | Status: AC
  Administered 2019-01-04: 15:00:00 10 mg via INTRAVENOUS
  Filled 2019-01-04: qty 2

## 2019-01-04 MED ORDER — SODIUM CHLORIDE 0.9 % IV BOLUS
500.0000 mL | Freq: Once | INTRAVENOUS | Status: AC
  Administered 2019-01-04: 500 mL via INTRAVENOUS

## 2019-01-04 MED ORDER — KETOROLAC TROMETHAMINE 15 MG/ML IJ SOLN
15.0000 mg | Freq: Once | INTRAMUSCULAR | Status: AC
  Administered 2019-01-04: 15:00:00 15 mg via INTRAVENOUS
  Filled 2019-01-04: qty 1

## 2019-01-04 MED ORDER — METOCLOPRAMIDE HCL 5 MG/ML IJ SOLN
10.0000 mg | Freq: Once | INTRAMUSCULAR | Status: AC
  Administered 2019-01-04: 10 mg via INTRAVENOUS
  Filled 2019-01-04: qty 2

## 2019-01-04 NOTE — ED Triage Notes (Signed)
Pt BIB EMS (from home with son) with C/O generalized weakness and headache x3 days. Pt has a HX of stroke approx 6 months ago. EMS unsure of any deficits.  Pt A/O x4 with a GCS of 15 upon EMS arrival.  Fine and gross motor function intact. Son administered aspirin for headache with no relief. VSS

## 2019-01-04 NOTE — ED Provider Notes (Addendum)
[facility]   CSN: [id number] Arrival date & time: 01/04/19  1110     History   Chief Complaint Chief Complaint  Patient presents with  . Headache  . Weakness    HPI Robin Moreno is a 85 y.o. female.     HPI  85 year old female comes in a chief complaint of headache.  She has history of hypertension, hyperlipidemia.  She informs me that she has been having headache for the last 2 days.  Headache is off and on.  She has taken aspirin for it without significant relief.  Headache is located mostly on the right side and described as heaviness and pressure.  She denies any associated vision change, nausea, vomiting, numbness, tingling, focal weakness, dizziness.  She also denies any fevers, chills, chest pain, abdominal pain.  Past Medical History:  Diagnosis Date  . Arthritis   . Degenerative disk disease   . Hypercholesterolemia   . Hypertension   . Osteoarthritis of right knee   . Renal disorder     Patient Active Problem List   Diagnosis Date Noted  . Viral gastroenteritis due to [place] agent 09/19/2018  . Acute kidney injury superimposed on CKD (HCC) 09/18/2018  . Diarrhea 09/18/2018  . Urinary tract infection 09/18/2018  . Generalized weakness 09/18/2018  . Arthritis 09/18/2018    Past Surgical History:  Procedure Laterality Date  . ABDOMINAL HYSTERECTOMY    . TOTAL HIP ARTHROPLASTY       OB History   No obstetric history on file.      Home Medications    Prior to Admission medications   Medication Sig Start Date End Date Taking? Authorizing Provider  aspirin EC 325 MG tablet Take 1 tablet (325 mg total) by mouth daily. 07/23/18   Pricilla LovelessGoldston, Scott, MD  Cholecalciferol (VITAMIN D) 2000 UNITS tablet Take 2,000 Units by mouth daily.      [provider]  diclofenac sodium (VOLTAREN) 1 % GEL Apply 4 g topically 4 (four) times daily. Apply to your left shoulder Patient not taking: Reported on  09/18/2018 11/14/17   Shaune PollackIsaacs, Cameron, MD  diltiazem (CARDIZEM) 60 MG tablet Take 60 mg by mouth 2 (two) times daily.      [provider]  enalapril (VASOTEC) 10 MG tablet Take 10 mg by mouth daily. 04/08/14   [provider]  gabapentin (NEURONTIN) 300 MG capsule Take 300 mg (1 capsule) twice daily the first day and then begin 300 mg (1 capsule) 3 times daily thereafter. Patient taking differently: Take 300 mg by mouth 3 (three) times daily. Take 300 mg (1 capsule) twice daily the first day and then begin 300 mg (1 capsule) 3 times daily thereafter. 09/12/18   Law, Waylan BogaAlexandra M, PA-C  hydrochlorothiazide (HYDRODIURIL) 25 MG tablet Take 25 mg by mouth daily.      [provider]  meclizine (ANTIVERT) 12.5 MG tablet Take 1 tablet (12.5 mg total) by mouth 3 (three) times daily as needed for dizziness. 07/23/18   Pricilla LovelessGoldston, Scott, MD  Menthol-Methyl Salicylate (MUSCLE RUB) 10-15 % CREA Apply 1 application topically as needed (for sore muscle).    [provider]  Multiple Vitamin (MULTIVITAMIN WITH MINERALS) TABS Take 1 tablet by mouth daily.    [provider]  oxyCODONE (ROXICODONE) 5 MG immediate release tablet Take 0.5 tablets (2.5 mg total) by mouth every 6 (six) hours as needed for severe pain. Patient not taking: Reported on 09/18/2018 11/14/17  Duffy Bruce, MD  Polyethyl Glycol-Propyl Glycol (SYSTANE OP) Place 1 drop into both eyes every morning.    [provider]  rosuvastatin (CRESTOR) 10 MG tablet Take 10 mg by mouth daily.    [provider]    Family History No family history on file.  Social History Social History   Tobacco Use  . Smoking status: Never Smoker  . Smokeless tobacco: Never Used  Substance Use Topics  . Alcohol use: No  . Drug use: No     Allergies   Patient has no known allergies.   Review of Systems Review of Systems  Constitutional: Positive for activity change.  Eyes: Negative for visual  disturbance.  Gastrointestinal: Negative for nausea and vomiting.  Allergic/Immunologic: Negative for immunocompromised state.  Neurological: Positive for headaches.  Hematological: Does not bruise/bleed easily.  All other systems reviewed and are negative.    Physical Exam Updated Vital Signs BP (!) 129/99   Pulse 65   Temp 98.9 F (37.2 C) (Oral)   Resp 15   SpO2 98%   Physical Exam Vitals signs and nursing note reviewed.  Constitutional:      Appearance: She is well-developed.  HENT:     Head: Atraumatic.  Eyes:     Extraocular Movements: Extraocular movements intact.     Pupils: Pupils are equal, round, and reactive to light.     Comments: No nystagmus  Neck:     Musculoskeletal: Neck supple.  Cardiovascular:     Rate and Rhythm: Normal rate and regular rhythm.     Heart sounds: Normal heart sounds.  Pulmonary:     Effort: Pulmonary effort is normal. No respiratory distress.  Abdominal:     General: There is no distension.     Palpations: Abdomen is soft.     Tenderness: There is no abdominal tenderness. There is no guarding or rebound.  Skin:    General: Skin is warm and dry.  Neurological:     Mental Status: She is alert and oriented to person, place, and time.     GCS: GCS eye subscore is 4. GCS verbal subscore is 5. GCS motor subscore is 6.     Cranial Nerves: No cranial nerve deficit.     Sensory: No sensory deficit.     Motor: No weakness.      ED Treatments / Results  Labs (all labs ordered are listed, but only abnormal results are displayed) Labs Reviewed  BASIC METABOLIC PANEL - Abnormal; Notable for the following components:      Result Value   Potassium 5.3 (*)    Creatinine, Ser 1.06 (*)    GFR calc non Af Amer 46 (*)    GFR calc Af Amer 53 (*)    All other components within normal limits  CBC WITH DIFFERENTIAL/PLATELET  SEDIMENTATION RATE    EKG EKG Interpretation  Date/Time:  Thursday January 04 2019 11:15:45 EDT Ventricular  Rate:  73 PR Interval:    QRS Duration: 90 QT Interval:  367 QTC Calculation: 405 R Axis:   26 Text Interpretation:  Accelerated junctional rhythm Borderline repolarization abnormality No acute changes No significant change since last tracing Nonspecific ST and T wave abnormality Confirmed by Varney Biles [id number]) on 01/04/2019 12:45:06 PM   Radiology Ct Head Wo Contrast  Result Date: 01/04/2019 CLINICAL DATA:  Acute headache with generalized weakness 3 days. EXAM: CT HEAD WITHOUT CONTRAST TECHNIQUE: Contiguous axial images were obtained from the base of the skull through the vertex  without intravenous contrast. COMPARISON:  09/29/2018 FINDINGS: Brain: Ventricles and cisterns are normal. There is mild age related atrophy. Mild chronic ischemic microvascular disease. Small old high left parietal infarct. No mass, mass effect, shift of midline structures or acute hemorrhage. No evidence of acute infarction. Vascular: No hyperdense vessel or unexpected calcification. Skull: Normal. Negative for fracture or focal lesion. Sinuses/Orbits: No acute finding. Other: None. IMPRESSION: No acute findings. Age related atrophy and chronic ischemic microvascular disease. Small old high left parietal infarct. Electronically Signed   By: Elberta Fortis M.D.   On: 01/04/2019 13:34    Procedures Procedures (including critical care time)  Medications Ordered in ED Medications  metoCLOPramide (REGLAN) injection 10 mg (has no administration in time range)  ketorolac (TORADOL) 15 MG/ML injection 15 mg (has no administration in time range)  acetaminophen (TYLENOL) tablet 650 mg (has no administration in time range)  metoCLOPramide (REGLAN) injection 10 mg (10 mg Intravenous Given 01/04/19 1201)  sodium chloride 0.9 % bolus 500 mL (0 mLs Intravenous Stopped 01/04/19 1336)     Initial Impression / Assessment and Plan / ED Course  I have reviewed the triage vital signs and the nursing notes.  Pertinent labs &  imaging results that were available during my care of the patient were reviewed by me and considered in my medical decision making (see chart for details).  Clinical Course as of Jan 04 1452  Thu Jan 04, 2019  1323 I discussed the case with patient's son.  He informs me that patient had a stroke earlier in the year.  She had dizziness at that time.  She started complaining of headache 3 days ago and he has been giving her aspirin.  Today she reported the headache was worsening despite the aspirin so he decided to bring her in.  He has noted no change in behavior from the patient.  He has not noticed any new neurologic deficits such as focal weakness, slurred speech, balance issues from the patient.  Patient does not walk well at baseline because of her arthritis.  He is aware that we will be getting CT head. If her CAT scan is normal then we will discharge her with outpatient follow-up for headache.  She does not have any known history of hypercoagulability, therefore I do not think we need to get a CT venogram now to look for cerebral venous thrombosis.  I think the outpatient neurology visit can perhaps consider other diagnoses for headaches like that.   [AN]    Clinical Course User Index [AN] Derwood Kaplan, MD       DDX includes: Primary headaches - including migrainous headaches, cluster headaches, tension headaches. ICH Carotid dissection Cavernous sinus thrombosis Meningitis Encephalitis Sinusitis Tumor Vascular headaches AV malformation Brain aneurysm Muscular headaches  A/P: Pt comes in with cc of headaches. Headaches have been going on for couple of days and are new.  She denies any trauma.  She has no new neurologic symptoms.  On exam there is nothing focal that we appreciate besides mild left-sided weakness in the upper and lower extremities, however patient states that she has arthritis and has weakness on that side already.  Spoke with son who also informs that patient  has poor ambulation because of her arthritis.  We will get CT head.  We will get basic labs. She does not have risk factors for conditions like venous thrombosis besides age.  No clinical concerns for brain infection right now.  If the CT head is negative  then we will be able to send her home with outpatient neuro follow-up.   33:54 PM 85 year old comes with a chief complaint of headache.  She had a negative CT head.  I have discussed the results with the son.  We will add a sed rate to rule out giant cell arteritis. If the sed rate is normal then she will be discharged. Patient informs that her headache is better.  She was unable to ambulate.  Son informs me that patient at baseline is not able to walk because of her severe arthritis.  Patient was reassessed few minutes ago and she has unchanged history and exam.  Final Clinical Impressions(s) / ED Diagnoses   Final diagnoses:  None    ED Discharge Orders    None       Derwood Kaplan, MD 01/04/19 1326    Derwood Kaplan, MD 01/04/19 1454

## 2019-01-04 NOTE — ED Provider Notes (Signed)
With elevated sed rate I discussed the patient's presentation with our neurology colleagues. Given her history of rheumatoid arthritis, this is unlikely to be due to giant cell arteritis. With improvement here she was discharged to follow-up with her neurologist.   Carmin Muskrat, MD 01/04/19 1753

## 2019-01-04 NOTE — ED Notes (Signed)
[facility] here for transport. 

## 2019-01-04 NOTE — ED Notes (Signed)
Pt transported home via [facility] 

## 2019-01-04 NOTE — ED Notes (Signed)
Attempted to ambulate patient in the hall with a walker but was unsuccessful. Pt was not able to stand. Assisted by this RN and Otila Kluver, NT. Pt states she does not usually walk at home.

## 2019-01-04 NOTE — Discharge Instructions (Addendum)
We saw you in the ER for headaches. All the labs and imaging are normal. We are not sure what is causing your headaches, however, there appears to be no evidence of infection, bleeds or tumors based on our exam and results.  Please take Tylenol every 6 hours and call the neurologist as requested to follow-up in 1 week. Please return to the ER if the headache gets severe and in not improving, you have associated new one sided numbness, tingling, weakness or confusion, seizures, poor balance or poor vision.

## 2019-07-30 ENCOUNTER — Other Ambulatory Visit: Payer: Self-pay

## 2019-07-30 ENCOUNTER — Emergency Department (HOSPITAL_COMMUNITY): Payer: Medicare Other

## 2019-07-30 ENCOUNTER — Encounter (HOSPITAL_COMMUNITY): Payer: Self-pay

## 2019-07-30 ENCOUNTER — Observation Stay (HOSPITAL_COMMUNITY)
Admission: EM | Admit: 2019-07-30 | Discharge: 2019-07-31 | Disposition: A | Discharge status: Home / Self Care | Payer: Medicare Other | Attending: Internal Medicine | Admitting: Internal Medicine

## 2019-07-30 DIAGNOSIS — I129 Hypertensive chronic kidney disease with stage 1 through stage 4 chronic kidney disease, or unspecified chronic kidney disease: Secondary | ICD-10-CM | POA: Insufficient documentation

## 2019-07-30 DIAGNOSIS — I959 Hypotension, unspecified: Secondary | ICD-10-CM | POA: Diagnosis not present

## 2019-07-30 DIAGNOSIS — E785 Hyperlipidemia, unspecified: Secondary | ICD-10-CM | POA: Insufficient documentation

## 2019-07-30 DIAGNOSIS — R55 Syncope and collapse: Secondary | ICD-10-CM | POA: Diagnosis not present

## 2019-07-30 DIAGNOSIS — E860 Dehydration: Secondary | ICD-10-CM | POA: Insufficient documentation

## 2019-07-30 DIAGNOSIS — Z7401 Bed confinement status: Secondary | ICD-10-CM | POA: Insufficient documentation

## 2019-07-30 DIAGNOSIS — N189 Chronic kidney disease, unspecified: Secondary | ICD-10-CM | POA: Diagnosis not present

## 2019-07-30 DIAGNOSIS — Z96649 Presence of unspecified artificial hip joint: Secondary | ICD-10-CM | POA: Insufficient documentation

## 2019-07-30 DIAGNOSIS — E7800 Pure hypercholesterolemia, unspecified: Secondary | ICD-10-CM | POA: Diagnosis not present

## 2019-07-30 DIAGNOSIS — M6281 Muscle weakness (generalized): Secondary | ICD-10-CM | POA: Insufficient documentation

## 2019-07-30 DIAGNOSIS — Z7982 Long term (current) use of aspirin: Secondary | ICD-10-CM | POA: Insufficient documentation

## 2019-07-30 DIAGNOSIS — Z79899 Other long term (current) drug therapy: Secondary | ICD-10-CM | POA: Insufficient documentation

## 2019-07-30 DIAGNOSIS — R531 Weakness: Secondary | ICD-10-CM

## 2019-07-30 DIAGNOSIS — R5381 Other malaise: Secondary | ICD-10-CM | POA: Insufficient documentation

## 2019-07-30 DIAGNOSIS — H9190 Unspecified hearing loss, unspecified ear: Secondary | ICD-10-CM | POA: Insufficient documentation

## 2019-07-30 DIAGNOSIS — I10 Essential (primary) hypertension: Secondary | ICD-10-CM

## 2019-07-30 DIAGNOSIS — K8020 Calculus of gallbladder without cholecystitis without obstruction: Secondary | ICD-10-CM | POA: Insufficient documentation

## 2019-07-30 DIAGNOSIS — U071 COVID-19: Principal | ICD-10-CM | POA: Diagnosis present

## 2019-07-30 LAB — URINALYSIS, ROUTINE W REFLEX MICROSCOPIC
Bilirubin Urine: NEGATIVE
Glucose, UA: NEGATIVE mg/dL
Hgb urine dipstick: NEGATIVE
Ketones, ur: NEGATIVE mg/dL
Leukocytes,Ua: NEGATIVE
Nitrite: NEGATIVE
Protein, ur: NEGATIVE mg/dL
Specific Gravity, Urine: 1.032 — ABNORMAL HIGH (ref 1.005–1.030)
pH: 6 (ref 5.0–8.0)

## 2019-07-30 LAB — CBC WITH DIFFERENTIAL/PLATELET
Abs Immature Granulocytes: 0.04 10*3/uL (ref 0.00–0.07)
Basophils Absolute: 0 10*3/uL (ref 0.0–0.1)
Basophils Relative: 1 %
Eosinophils Absolute: 0 10*3/uL (ref 0.0–0.5)
Eosinophils Relative: 1 %
HCT: 39.2 % (ref 36.0–46.0)
Hemoglobin: 11.8 g/dL — ABNORMAL LOW (ref 12.0–15.0)
Immature Granulocytes: 1 %
Lymphocytes Relative: 35 %
Lymphs Abs: 1.5 10*3/uL (ref 0.7–4.0)
MCH: 29.1 pg (ref 26.0–34.0)
MCHC: 30.1 g/dL (ref 30.0–36.0)
MCV: 96.6 fL (ref 80.0–100.0)
Monocytes Absolute: 0.3 10*3/uL (ref 0.1–1.0)
Monocytes Relative: 6 %
Neutro Abs: 2.5 10*3/uL (ref 1.7–7.7)
Neutrophils Relative %: 56 %
Platelets: 199 10*3/uL (ref 150–400)
RBC: 4.06 MIL/uL (ref 3.87–5.11)
RDW: 13.7 % (ref 11.5–15.5)
WBC: 4.3 10*3/uL (ref 4.0–10.5)
nRBC: 0 % (ref 0.0–0.2)

## 2019-07-30 LAB — RESPIRATORY PANEL BY RT PCR (FLU A&B, COVID)
Influenza A by PCR: NEGATIVE
Influenza B by PCR: NEGATIVE
SARS Coronavirus 2 by RT PCR: POSITIVE — AB

## 2019-07-30 LAB — BASIC METABOLIC PANEL
Anion gap: 13 (ref 5–15)
BUN: 31 mg/dL — ABNORMAL HIGH (ref 8–23)
CO2: 22 mmol/L (ref 22–32)
Calcium: 8.9 mg/dL (ref 8.9–10.3)
Chloride: 99 mmol/L (ref 98–111)
Creatinine, Ser: 1.18 mg/dL — ABNORMAL HIGH (ref 0.44–1.00)
GFR calc Af Amer: 46 mL/min — ABNORMAL LOW (ref >60)
GFR calc non Af Amer: 40 mL/min — ABNORMAL LOW (ref >60)
Glucose, Bld: 130 mg/dL — ABNORMAL HIGH (ref 70–99)
Potassium: 4.6 mmol/L (ref 3.5–5.1)
Sodium: 134 mmol/L — ABNORMAL LOW (ref 135–145)

## 2019-07-30 LAB — CBG MONITORING, ED: Glucose-Capillary: 117 mg/dL — ABNORMAL HIGH (ref 70–99)

## 2019-07-30 LAB — LACTIC ACID, PLASMA: Lactic Acid, Venous: 0.9 mmol/L (ref 0.5–1.9)

## 2019-07-30 MED ORDER — IOHEXOL 300 MG/ML  SOLN
80.0000 mL | Freq: Once | INTRAMUSCULAR | Status: AC | PRN
  Administered 2019-07-30: 80 mL via INTRAVENOUS

## 2019-07-30 MED ORDER — SODIUM CHLORIDE 0.9 % IV SOLN
1.0000 g | Freq: Once | INTRAVENOUS | Status: AC
  Administered 2019-07-30: 1 g via INTRAVENOUS
  Filled 2019-07-30: qty 10

## 2019-07-30 MED ORDER — SODIUM CHLORIDE 0.9 % IV BOLUS
1000.0000 mL | Freq: Once | INTRAVENOUS | Status: AC
  Administered 2019-07-30: 1000 mL via INTRAVENOUS

## 2019-07-30 MED ORDER — SODIUM CHLORIDE 0.9 % IV SOLN
500.0000 mg | Freq: Once | INTRAVENOUS | Status: AC
  Administered 2019-07-30: 500 mg via INTRAVENOUS
  Filled 2019-07-30: qty 500

## 2019-07-30 MED ORDER — CEFTRIAXONE SODIUM 500 MG IJ SOLR: 250.0000 mg | Freq: Once | INTRAMUSCULAR | Status: DC

## 2019-07-30 MED ORDER — NALOXONE HCL 0.4 MG/ML IJ SOLN
0.4000 mg | Freq: Once | INTRAMUSCULAR | Status: AC
  Administered 2019-07-30: 0.4 mg via INTRAVENOUS
  Filled 2019-07-30: qty 1

## 2019-07-30 NOTE — ED Notes (Signed)
Pt to CT

## 2019-07-30 NOTE — ED Provider Notes (Signed)
[facility] EMERGENCY DEPARTMENT Provider Note   CSN: [id number] Arrival date & time: 07/30/19  1642     History Chief Complaint  Patient presents with  . Loss of Consciousness    Robin Moreno is a 86 y.o. female.  Robin Moreno is a 86 yo F w/ a PMHx of HTN, hypercholesterolemia, DDD, OA and CKD who presents with episode of syncope in setting of recent URI. Hx per her son as patient poor historian. He reports Robin Moreno was in her usual state of health until around 1 week ago at which time the patient developed a cold with cough and decreased appetite. Her son has also been sick with a cold. He reports decreased po intake. He denies fevers, chills, sore throat, chest pain, SOB. He does report some nausea and abdominal pain but not vomiting or diarrhea. He denies dysuria and any previous episodes of syncope. He reports Robin Moreno has continued to take her medications which include gabapentin, tramadol, HCTZ, enalapril, diltiazem, aspirin and meclizine. Robin Moreno took her tramadol at noon today. Robin Moreno also took her HCTZ, enalapril and diltiazem today. At baseline the patient is "sharp as a tack," per son.       Past Medical History:  Diagnosis Date  . Arthritis   . Degenerative disk disease   . Hypercholesterolemia   . Hypertension   . Osteoarthritis of right knee   . Renal disorder     Patient Active Problem List   Diagnosis Date Noted  . Viral gastroenteritis due to [place] agent 09/19/2018  . Acute kidney injury superimposed on CKD (HCC) 09/18/2018  . Diarrhea 09/18/2018  . Urinary tract infection 09/18/2018  . Generalized weakness 09/18/2018  . Arthritis 09/18/2018    Past Surgical History:  Procedure Laterality Date  . ABDOMINAL HYSTERECTOMY    . TOTAL HIP ARTHROPLASTY       OB History   No obstetric history on file.     History reviewed. No pertinent family history.  Social History   Tobacco Use  . Smoking status: Never Smoker  . Smokeless tobacco: Never Used   Substance Use Topics  . Alcohol use: No  . Drug use: No    Home Medications Prior to Admission medications   Medication Sig Start Date End Date Taking? Authorizing Provider  acetaminophen (TYLENOL) 325 MG tablet Take 2 tablets (650 mg total) by mouth every 6 (six) hours as needed. 01/04/19   Derwood Kaplan, MD  aspirin EC 325 MG tablet Take 1 tablet (325 mg total) by mouth daily. 07/23/18   Pricilla Loveless, MD  Cholecalciferol (VITAMIN D) 2000 UNITS tablet Take 2,000 Units by mouth daily.      [provider]  diclofenac sodium (VOLTAREN) 1 % GEL Apply 4 g topically 4 (four) times daily. Apply to your left shoulder Patient not taking: Reported on 09/18/2018 11/14/17   Shaune Pollack, MD  diltiazem (CARDIZEM) 60 MG tablet Take 60 mg by mouth 2 (two) times daily.      [provider]  enalapril (VASOTEC) 10 MG tablet Take 10 mg by mouth daily. 04/08/14   [provider]  gabapentin (NEURONTIN) 300 MG capsule Take 300 mg (1 capsule) twice daily the first day and then begin 300 mg (1 capsule) 3 times daily thereafter. Patient taking differently: Take 300 mg by mouth 3 (three) times daily. Take 300 mg (1 capsule) twice daily the first day and then begin 300 mg (1 capsule) 3 times daily thereafter. 09/12/18   Buel Ream  M, PA-C  hydrochlorothiazide (HYDRODIURIL) 25 MG tablet Take 25 mg by mouth daily.      [provider]  meclizine (ANTIVERT) 12.5 MG tablet Take 1 tablet (12.5 mg total) by mouth 3 (three) times daily as needed for dizziness. 07/23/18   Pricilla Loveless, MD  Menthol-Methyl Salicylate (MUSCLE RUB) 10-15 % CREA Apply 1 application topically as needed (for sore muscle).    [provider]  Multiple Vitamin (MULTIVITAMIN WITH MINERALS) TABS Take 1 tablet by mouth daily.    [provider]  oxyCODONE (ROXICODONE) 5 MG immediate release tablet Take 0.5 tablets (2.5 mg total) by mouth every 6 (six) hours as needed for severe  pain. Patient not taking: Reported on 09/18/2018 11/14/17   Shaune Pollack, MD  Polyethyl Glycol-Propyl Glycol (SYSTANE OP) Place 1 drop into both eyes every morning.    [provider]  rosuvastatin (CRESTOR) 10 MG tablet Take 10 mg by mouth daily.    [provider]    Allergies    Patient has no known allergies.  Review of Systems   Review of Systems  Unable to perform ROS: Mental status change   Physical Exam Updated Vital Signs BP (!) 125/53   Pulse 62   Temp (!) 97.4 F (36.3 C) (Oral)   Resp 19   SpO2 100%   Physical Exam Vitals and nursing note reviewed.  Constitutional:      Comments: Confused but pleasant  HENT:     Head: Normocephalic and atraumatic.  Eyes:     Pupils: Pupils are equal, round, and reactive to light.     Comments: Could not cooperate to assess EOM  Cardiovascular:     Rate and Rhythm: Bradycardia present.     Heart sounds: Murmur (3/6 systolic) present.     Comments: No LE edema Pulmonary:     Effort: Pulmonary effort is normal.     Breath sounds: Normal breath sounds.  Abdominal:     General: Abdomen is flat.     Palpations: Abdomen is soft.     Tenderness: There is abdominal tenderness.  Musculoskeletal:        General: No swelling, tenderness or deformity.  Skin:    General: Skin is warm and dry.  Neurological:     General: No focal deficit present.     Mental Status: Robin Moreno is disoriented.     Comments: Unable to cooperate for CN exam    ED Results / Procedures / Treatments   Labs (all labs ordered are listed, but only abnormal results are displayed) Labs Reviewed  RESPIRATORY PANEL BY RT PCR (FLU A&B, COVID) - Abnormal; Notable for the following components:      Result Value   SARS Coronavirus 2 by RT PCR POSITIVE (*)    All other components within normal limits  BASIC METABOLIC PANEL - Abnormal; Notable for the following components:   Sodium 134 (*)    Glucose, Bld 130 (*)    BUN 31 (*)    Creatinine, Ser  1.18 (*)    GFR calc non Af Amer 40 (*)    GFR calc Af Amer 46 (*)    All other components within normal limits  CBC WITH DIFFERENTIAL/PLATELET - Abnormal; Notable for the following components:   Hemoglobin 11.8 (*)    All other components within normal limits  CBG MONITORING, ED - Abnormal; Notable for the following components:   Glucose-Capillary 117 (*)    All other components within normal limits  CULTURE,  BLOOD (ROUTINE X 2)  CULTURE, BLOOD (ROUTINE X 2)  LACTIC ACID, PLASMA  URINALYSIS, ROUTINE W REFLEX MICROSCOPIC  TROPONIN I (HIGH SENSITIVITY)    EKG EKG Interpretation  Date/Time:  Monday Jul 30 2019 17:02:49 EDT Ventricular Rate:  65 PR Interval:    QRS Duration: 98 QT Interval:  402 QTC Calculation: 418 R Axis:   22 Text Interpretation: Junctional rhythm Artifact Abnormal R-wave progression, early transition Probable LVH with secondary repol abnrm No significant change since last tracing Confirmed by Gareth Morgan [phone]) on 07/30/2019 6:38:24 PM   Radiology CT ABDOMEN PELVIS W CONTRAST  Result Date: 07/30/2019 CLINICAL DATA:  Abdominal pain. EXAM: CT ABDOMEN AND PELVIS WITH CONTRAST TECHNIQUE: Multidetector CT imaging of the abdomen and pelvis was performed using the standard protocol following bolus administration of intravenous contrast. CONTRAST:  21mL OMNIPAQUE IOHEXOL 300 MG/ML  SOLN COMPARISON:  None. FINDINGS: Lower chest: There is atelectasis and trace bilateral pleural effusions.The heart size is normal. Hepatobiliary: The liver is normal. Cholelithiasis without acute inflammation.There is no biliary ductal dilation. Pancreas: Normal contours without ductal dilatation. No peripancreatic fluid collection. Spleen: Unremarkable. Adrenals/Urinary Tract: --Adrenal glands: Unremarkable. --Right kidney/ureter: No hydronephrosis or radiopaque kidney stones. --Left kidney/ureter: No hydronephrosis or radiopaque kidney stones. --Urinary bladder: Unremarkable. Stomach/Bowel:  --Stomach/Duodenum: No hiatal hernia or other gastric abnormality. Normal duodenal course and caliber. --Small bowel: Unremarkable. --Colon: Unremarkable. --Appendix: Not visualized. No right lower quadrant inflammation or free fluid. Vascular/Lymphatic: Atherosclerotic calcification is present within the non-aneurysmal abdominal aorta, without hemodynamically significant stenosis. --No retroperitoneal lymphadenopathy. --No mesenteric lymphadenopathy. --No pelvic or inguinal lymphadenopathy. Reproductive: Status post hysterectomy. No adnexal mass. Other: No ascites or free air. The abdominal wall is normal. Musculoskeletal. There are degenerative changes of the lumbar spine with a degenerative anterolisthesis of L4 on L5. There is no acute displaced fracture. There are advanced degenerative changes of the left hip. IMPRESSION: 1. No acute abdominopelvic abnormality. 2. Cholelithiasis without acute inflammation. Aortic Atherosclerosis (ICD10-I70.0). Electronically Signed   By: Constance Holster M.D.   On: 07/30/2019 21:06   DG Chest Port 1 View  Result Date: 07/30/2019 CLINICAL DATA:  Cough EXAM: PORTABLE CHEST 1 VIEW COMPARISON:  09/29/2018 FINDINGS: Heart and mediastinal contours are within normal limits. No focal opacities or effusions. No acute bony abnormality. IMPRESSION: No active disease. Electronically Signed   By: Rolm Baptise M.D.   On: 07/30/2019 18:20    Procedures Procedures (including critical care time)  Medications Ordered in ED Medications  sodium chloride 0.9 % bolus 1,000 mL (0 mLs Intravenous Stopped 07/30/19 2139)  naloxone [facility]) injection 0.4 mg (0.4 mg Intravenous Given 07/30/19 1932)  azithromycin (ZITHROMAX) 500 mg in sodium chloride 0.9 % 250 mL IVPB (0 mg Intravenous Stopped 07/30/19 2140)  cefTRIAXone (ROCEPHIN) 1 g in sodium chloride 0.9 % 100 mL IVPB (0 g Intravenous Stopped 07/30/19 2047)  iohexol (OMNIPAQUE) 300 MG/ML solution 80 mL (80 mLs Intravenous Contrast Given 07/30/19  2053)    ED Course  I have reviewed the triage vital signs and the nursing notes.  Pertinent labs & imaging results that were available during my care of the patient were reviewed by me and considered in my medical decision making (see chart for details).    MDM Rules/Calculators/A&P                      Robin Moreno is a 86 yo F w/ a PMHx of HTN, hypercholesterolemia, DDD, OA and CKD who presents with episode of  syncope in setting of recent URI.   Syncope: -pt presented with episode of syncope while doing home health PT -Robin Moreno sat up on the side of the bed and reported Robin Moreno felt too weak to proceed and that Robin Moreno may pass out -per son her eyes rolled back in her head and Robin Moreno fell back on the bed so he called 911 -per EMS Robin Moreno was unresponsive with carotid pulse of 40 when they arrived -in ED her pulse is 60 and Robin Moreno is hypotensive with one reading of 66/36, most recently 105/47 -per the son Robin Moreno has had a URI recently with a cough and hasn't been eating or drinking as well; the son has been giving her robitussin and mucinex -Robin Moreno continued to take her HCTZ, enalapril and diltiazem as well as gabapentin and tramadol which Robin Moreno took as recently as noon today -although pt afebrile this is concerning for sepsis given bradycardia and hypotension -given significant bradycardia, concern that this is cardiogenic syncope and/or diltiazem intolerance -given hypotension this could also be orthostatic hypotension secondary to poor po and continued BP medication adherence -could also be opioid induced as patient elderly or side effect of mucinex or gabapentin -given abdominal pain on exam some concern for abdominal infection or ruptured AAA in setting of hypotension  Plan: -CBC, BMP -chest x-ray, UA, urine cx, bld cxs x2 -1 L bolus NS -telemetry -narcan -azithro/ceftriaxone -CT abdomen/pelvis w/ contrast - unremarkable  Final Clinical Impression(s) / ED Diagnoses Final diagnoses:  Syncope, unspecified  syncope type  Hypotension, unspecified hypotension type  Dehydration   -admit for further syncope work up/sepsis treatment  Rx / DC Orders ED Discharge Orders    None       Jenell Milliner, MD 07/30/19 2212    Alvira Monday, MD 07/31/19 0005

## 2019-07-30 NOTE — ED Triage Notes (Signed)
Pt BIB [facility] from home c/o syncopal episode. Pt was at home doing home health therapy when she sat up on the side of the bed and became dizzy. Pt passed out and son called EMS. EMS stated upon arrival pt was unresponsive with a carotid pulse of 40. Pt became alert with EMS. Pt is alert and oriented x4. Pt states she remembers doing therapy and stating she was going to pass out.

## 2019-07-30 NOTE — Progress Notes (Signed)
Patient had 25-30 cc contrast extravasation in right posterior forearm. Dr. Gwenyth Bender (radiologist) examined patient's arm and felt it to minimal. Post extravasation order were placed and IV was removed.

## 2019-07-30 NOTE — ED Notes (Addendum)
Pt could not sit on the bed long enough or stand for orthostatic vs

## 2019-07-30 NOTE — ED Notes (Signed)
Robin Moreno, son, [phone] would like an update when available

## 2019-07-30 NOTE — H&P (Signed)
[facility] History and Physical  Robin Moreno WNI:[id number] DOB: September 27, 1925 DOA: 07/30/2019  Referring EDP: Darl Pikes PCP: Ralene Ok, MD   Chief Complaint: Weakness  HPI: Robin Moreno is a 86 y.o. female with PMH of HTN and HLD who presented to ED after episode of pre-syncope and admitted for observation of COVID-19 infection.  History provided mainly by patient's son by phone as patient very hard of hearing. Patient reports that she is here for "some tests" and that sometimes she has abdominal pain when she needs to have a bowel movement but otherwise denies any concerns or complaints at this time. Son notes that he felt his mother has had a cold for the last week and that she has had a cough and congestion with poor PO intake. He gave her some cough medicine yesterday and she was able to eat better. Today, PT came to work with the patient at home and she felt too weak and went to rest in bed. No LOC. He was worried about her and brought her in for evaluation. To his knowledge, denies patient complaining headache, dizziness, fever, SOB, chest pain, abdominal pain, nausea, vomiting, diarrhea, constipation, dysuria, hematuria, hematochezia, melena, difficulty moving arms/legs, speech difficulty, confusion or any other complaints.  In the ED: Patient reportedly hypotensive with SBP in 60's on arrival although not documented. Otherwise labs stable on room air. Labs remarkable for Cr 1.18, glucose 130, WBC 4.3, Lactate 0.9 and COVID positive. CT Abd/Pelv: 1. No acute abdominopelvic abnormality. 2. Cholelithiasis without acute inflammation.  CXR: No active disease.  Patient was called for admission for syncope workup. COVID positive resulted later.   Review of Systems:  All other systems negative unless noted above in HPI.   Past Medical History:  Diagnosis Date  . Arthritis   . Degenerative disk disease   . Hypercholesterolemia   . Hypertension   . Osteoarthritis of right knee    . Renal disorder    Past Surgical History:  Procedure Laterality Date  . ABDOMINAL HYSTERECTOMY    . TOTAL HIP ARTHROPLASTY     Social History:  reports that she has never smoked. She has never used smokeless tobacco. She reports that she does not drink alcohol or use drugs.  No Known Allergies  History reviewed. No pertinent family history. Patient unable to provide history and son unsure.   Prior to Admission medications   Medication Sig Start Date End Date Taking? Authorizing Provider  acetaminophen (TYLENOL) 325 MG tablet Take 2 tablets (650 mg total) by mouth every 6 (six) hours as needed. 01/04/19   Derwood Kaplan, MD  aspirin EC 325 MG tablet Take 1 tablet (325 mg total) by mouth daily. 07/23/18   Pricilla Loveless, MD  Cholecalciferol (VITAMIN D) 2000 UNITS tablet Take 2,000 Units by mouth daily.      [provider]  diclofenac sodium (VOLTAREN) 1 % GEL Apply 4 g topically 4 (four) times daily. Apply to your left shoulder Patient not taking: Reported on 09/18/2018 11/14/17   Shaune Pollack, MD  diltiazem (CARDIZEM) 60 MG tablet Take 60 mg by mouth 2 (two) times daily.      [provider]  enalapril (VASOTEC) 10 MG tablet Take 10 mg by mouth daily. 04/08/14   [provider]  gabapentin (NEURONTIN) 300 MG capsule Take 300 mg (1 capsule) twice daily the first day and then begin 300 mg (1 capsule) 3 times daily thereafter. Patient taking differently: Take 300 mg by mouth 3 (three) times  daily. Take 300 mg (1 capsule) twice daily the first day and then begin 300 mg (1 capsule) 3 times daily thereafter. 09/12/18   Law, Bea Graff, PA-C  hydrochlorothiazide (HYDRODIURIL) 25 MG tablet Take 25 mg by mouth daily.      [provider]  meclizine (ANTIVERT) 12.5 MG tablet Take 1 tablet (12.5 mg total) by mouth 3 (three) times daily as needed for dizziness. 07/23/18   Sherwood Gambler, MD  Menthol-Methyl Salicylate (MUSCLE RUB) 10-15 % CREA Apply 1 application  topically as needed (for sore muscle).    [provider]  Multiple Vitamin (MULTIVITAMIN WITH MINERALS) TABS Take 1 tablet by mouth daily.    [provider]  oxyCODONE (ROXICODONE) 5 MG immediate release tablet Take 0.5 tablets (2.5 mg total) by mouth every 6 (six) hours as needed for severe pain. Patient not taking: Reported on 09/18/2018 11/14/17   Duffy Bruce, MD  Polyethyl Glycol-Propyl Glycol (SYSTANE OP) Place 1 drop into both eyes every morning.    [provider]  rosuvastatin (CRESTOR) 10 MG tablet Take 10 mg by mouth daily.    [provider]   Physical Exam: Vitals:   07/30/19 1830 07/30/19 1937 07/30/19 1945 07/30/19 2145  BP: (!) 105/51 (!) 115/51 (!) 113/47 (!) 125/53  Pulse: (!) 59 60 60 62  Resp: (!) 24 17 (!) 24 19  Temp:      TempSrc:      SpO2: 97% 97% 96% 100%    Wt Readings from Last 3 Encounters:  09/29/18 64.9 kg  09/12/18 64.9 kg  07/23/18 72.6 kg    . General:  Appears calm and comfortable. Oriented to person, place and situation. Unable to assess time orientation as patient very hard of hearing.  . Eyes: EOMI, normal lids, irises & conjunctiva . ENT: very hard of hearing . Neck: normal ROM . Cardiovascular: RRR, no m/r/g. No LE edema. Marland Kitchen Respiratory: CTA bilaterally, no w/r/r. Normal respiratory effort. . Abdomen: soft, ntnd . Skin: no rash or induration seen on limited exam . Musculoskeletal: grossly normal tone BUE/BLE . Psychiatric: grossly normal mood and affect, speech fluent and appropriate . Neurologic: grossly non-focal.          Labs on Admission:  Basic Metabolic Panel: Recent Labs  Lab 07/30/19 1731  NA 134*  K 4.6  CL 99  CO2 22  GLUCOSE 130*  BUN 31*  CREATININE 1.18*  CALCIUM 8.9   Liver Function Tests: No results for input(s): AST, ALT, ALKPHOS, BILITOT, PROT, ALBUMIN in the last 168 hours. No results for input(s): LIPASE, AMYLASE in the last 168 hours. No results for input(s):  AMMONIA in the last 168 hours. CBC: Recent Labs  Lab 07/30/19 1731  WBC 4.3  NEUTROABS 2.5  HGB 11.8*  HCT 39.2  MCV 96.6  PLT 199   Cardiac Enzymes: No results for input(s): CKTOTAL, CKMB, CKMBINDEX, TROPONINI in the last 168 hours.  BNP (last 3 results) No results for input(s): BNP in the last 8760 hours.  ProBNP (last 3 results) No results for input(s): PROBNP in the last 8760 hours.  CBG: Recent Labs  Lab 07/30/19 1918  GLUCAP 117*    Radiological Exams on Admission: CT ABDOMEN PELVIS W CONTRAST  Result Date: 07/30/2019 CLINICAL DATA:  Abdominal pain. EXAM: CT ABDOMEN AND PELVIS WITH CONTRAST TECHNIQUE: Multidetector CT imaging of the abdomen and pelvis was performed using the standard protocol following bolus administration of intravenous contrast. CONTRAST:  39mL OMNIPAQUE IOHEXOL 300 MG/ML  SOLN COMPARISON:  None. FINDINGS: Lower chest: There is atelectasis and trace bilateral pleural effusions.The heart size is normal. Hepatobiliary: The liver is normal. Cholelithiasis without acute inflammation.There is no biliary ductal dilation. Pancreas: Normal contours without ductal dilatation. No peripancreatic fluid collection. Spleen: Unremarkable. Adrenals/Urinary Tract: --Adrenal glands: Unremarkable. --Right kidney/ureter: No hydronephrosis or radiopaque kidney stones. --Left kidney/ureter: No hydronephrosis or radiopaque kidney stones. --Urinary bladder: Unremarkable. Stomach/Bowel: --Stomach/Duodenum: No hiatal hernia or other gastric abnormality. Normal duodenal course and caliber. --Small bowel: Unremarkable. --Colon: Unremarkable. --Appendix: Not visualized. No right lower quadrant inflammation or free fluid. Vascular/Lymphatic: Atherosclerotic calcification is present within the non-aneurysmal abdominal aorta, without hemodynamically significant stenosis. --No retroperitoneal lymphadenopathy. --No mesenteric lymphadenopathy. --No pelvic or inguinal lymphadenopathy.  Reproductive: Status post hysterectomy. No adnexal mass. Other: No ascites or free air. The abdominal wall is normal. Musculoskeletal. There are degenerative changes of the lumbar spine with a degenerative anterolisthesis of L4 on L5. There is no acute displaced fracture. There are advanced degenerative changes of the left hip. IMPRESSION: 1. No acute abdominopelvic abnormality. 2. Cholelithiasis without acute inflammation. Aortic Atherosclerosis (ICD10-I70.0). Electronically Signed   By: Katherine Mantle M.D.   On: 07/30/2019 21:06   DG Chest Port 1 View  Result Date: 07/30/2019 CLINICAL DATA:  Cough EXAM: PORTABLE CHEST 1 VIEW COMPARISON:  09/29/2018 FINDINGS: Heart and mediastinal contours are within normal limits. No focal opacities or effusions. No acute bony abnormality. IMPRESSION: No active disease. Electronically Signed   By: Charlett Nose M.D.   On: 07/30/2019 18:20    EKG: Independently reviewed. HR 65. Junctional rhythm as noted on 2 prior EKG's. QTc 418. No STEMI.  Assessment/Plan Principal Problem:   COVID-19 virus infection Active Problems:   Generalized weakness   Essential hypertension   Dyslipidemia   Pre-syncope  86 y.o. female with PMH of HTN and HLD who presented to ED after episode of pre-syncope and admitted for observation of COVID-19 infection.  COVID-19 Infection - 1 week history of cough and congestion - Vitals stable on room air - Inflammatory markers ordered on admission - Vit C, Zinc - Tele - O2 PRN - No anti-viral or steroids at this time as patient is stable on room air with normal labs  - PT/OT consulted  Pre-Syncope - Questionable history  - Son reports patient did not pass out but just felt weak - likely in setting of COVID  - Trop and BNP ordered on admission - Tele - Order Echo as needed; did not feel indicated at this time - Hold Opioids and Gabapentin  HTN - Hold Diltiazem, Enalapril and HCTZ due to low-normal BP's  HLD - cont  Crestor  Code Status: Full DVT Prophylaxis: Lovenox Family Communication: Talked with son, Daryle, who would like daily updates. [phone] Disposition Plan: Place in observation. Patient currently not requiring O2 and labs stable. She is high risk for decompensation considering age and co-morbidities along with hypotension that was noted on arrival. Possible discharge home tomorrow for supportive care if remains stable.   Time spent: 70 minutes  Joselyn Arrow, MD [facility] Pager [phone]

## 2019-07-31 ENCOUNTER — Observation Stay (HOSPITAL_COMMUNITY): Payer: Medicare Other

## 2019-07-31 ENCOUNTER — Other Ambulatory Visit (HOSPITAL_COMMUNITY): Payer: Medicare Other

## 2019-07-31 DIAGNOSIS — I10 Essential (primary) hypertension: Secondary | ICD-10-CM

## 2019-07-31 DIAGNOSIS — U071 COVID-19: Principal | ICD-10-CM

## 2019-07-31 DIAGNOSIS — R531 Weakness: Secondary | ICD-10-CM

## 2019-07-31 DIAGNOSIS — E860 Dehydration: Secondary | ICD-10-CM | POA: Diagnosis not present

## 2019-07-31 DIAGNOSIS — R55 Syncope and collapse: Secondary | ICD-10-CM | POA: Diagnosis not present

## 2019-07-31 DIAGNOSIS — E785 Hyperlipidemia, unspecified: Secondary | ICD-10-CM | POA: Diagnosis not present

## 2019-07-31 LAB — CBC WITH DIFFERENTIAL/PLATELET
Abs Immature Granulocytes: 0.02 10*3/uL (ref 0.00–0.07)
Basophils Absolute: 0 10*3/uL (ref 0.0–0.1)
Basophils Relative: 0 %
Eosinophils Absolute: 0 10*3/uL (ref 0.0–0.5)
Eosinophils Relative: 1 %
HCT: 34.6 % — ABNORMAL LOW (ref 36.0–46.0)
Hemoglobin: 10.7 g/dL — ABNORMAL LOW (ref 12.0–15.0)
Immature Granulocytes: 1 %
Lymphocytes Relative: 42 %
Lymphs Abs: 1.3 10*3/uL (ref 0.7–4.0)
MCH: 28.6 pg (ref 26.0–34.0)
MCHC: 30.9 g/dL (ref 30.0–36.0)
MCV: 92.5 fL (ref 80.0–100.0)
Monocytes Absolute: 0.4 10*3/uL (ref 0.1–1.0)
Monocytes Relative: 15 %
Neutro Abs: 1.2 10*3/uL — ABNORMAL LOW (ref 1.7–7.7)
Neutrophils Relative %: 41 %
Platelets: 184 10*3/uL (ref 150–400)
RBC: 3.74 MIL/uL — ABNORMAL LOW (ref 3.87–5.11)
RDW: 13.7 % (ref 11.5–15.5)
WBC: 3 10*3/uL — ABNORMAL LOW (ref 4.0–10.5)
nRBC: 0 % (ref 0.0–0.2)

## 2019-07-31 LAB — COMPREHENSIVE METABOLIC PANEL
ALT: 16 U/L (ref 0–44)
AST: 27 U/L (ref 15–41)
Albumin: 2.7 g/dL — ABNORMAL LOW (ref 3.5–5.0)
Alkaline Phosphatase: 82 U/L (ref 38–126)
Anion gap: 13 (ref 5–15)
BUN: 25 mg/dL — ABNORMAL HIGH (ref 8–23)
CO2: 22 mmol/L (ref 22–32)
Calcium: 8.7 mg/dL — ABNORMAL LOW (ref 8.9–10.3)
Chloride: 99 mmol/L (ref 98–111)
Creatinine, Ser: 0.95 mg/dL (ref 0.44–1.00)
GFR calc Af Amer: >60 mL/min (ref >60)
GFR calc non Af Amer: 52 mL/min — ABNORMAL LOW (ref >60)
Glucose, Bld: 88 mg/dL (ref 70–99)
Potassium: 4.6 mmol/L (ref 3.5–5.1)
Sodium: 134 mmol/L — ABNORMAL LOW (ref 135–145)
Total Bilirubin: 0.3 mg/dL (ref 0.3–1.2)
Total Protein: 6.8 g/dL (ref 6.5–8.1)

## 2019-07-31 LAB — C-REACTIVE PROTEIN: CRP: 4.7 mg/dL — ABNORMAL HIGH (ref <1.0)

## 2019-07-31 LAB — BRAIN NATRIURETIC PEPTIDE: B Natriuretic Peptide: 30.9 pg/mL (ref 0.0–100.0)

## 2019-07-31 LAB — TROPONIN I (HIGH SENSITIVITY): Troponin I (High Sensitivity): 8 ng/L (ref <18)

## 2019-07-31 LAB — MAGNESIUM: Magnesium: 2.1 mg/dL (ref 1.7–2.4)

## 2019-07-31 LAB — FERRITIN: Ferritin: 720 ng/mL — ABNORMAL HIGH (ref 11–307)

## 2019-07-31 MED ORDER — ASPIRIN EC 325 MG PO TBEC
325.0000 mg | DELAYED_RELEASE_TABLET | Freq: Every day | ORAL | Status: DC
  Administered 2019-07-31: 325 mg via ORAL
  Filled 2019-07-31: qty 1

## 2019-07-31 MED ORDER — ENOXAPARIN SODIUM 40 MG/0.4ML ~~LOC~~ SOLN
40.0000 mg | SUBCUTANEOUS | Status: DC
  Administered 2019-07-31: 40 mg via SUBCUTANEOUS
  Filled 2019-07-31: qty 0.4

## 2019-07-31 MED ORDER — ASCORBIC ACID 500 MG PO TABS
500.0000 mg | ORAL_TABLET | Freq: Every day | ORAL | Status: DC
  Administered 2019-07-31: 500 mg via ORAL
  Filled 2019-07-31: qty 1

## 2019-07-31 MED ORDER — GUAIFENESIN-DM 100-10 MG/5ML PO SYRP: 10.0000 mL | ORAL_SOLUTION | ORAL | Status: DC | PRN

## 2019-07-31 MED ORDER — ZINC SULFATE 220 (50 ZN) MG PO CAPS
220.0000 mg | ORAL_CAPSULE | Freq: Every day | ORAL | Status: DC
  Administered 2019-07-31: 220 mg via ORAL
  Filled 2019-07-31: qty 1

## 2019-07-31 MED ORDER — ALBUTEROL SULFATE HFA 108 (90 BASE) MCG/ACT IN AERS
2.0000 | INHALATION_SPRAY | Freq: Four times a day (QID) | RESPIRATORY_TRACT | Status: DC
  Administered 2019-07-31 (×3): 2 via RESPIRATORY_TRACT
  Filled 2019-07-31: qty 6.7

## 2019-07-31 MED ORDER — ROSUVASTATIN CALCIUM 5 MG PO TABS
10.0000 mg | ORAL_TABLET | Freq: Every day | ORAL | Status: DC
  Administered 2019-07-31: 10 mg via ORAL
  Filled 2019-07-31: qty 2

## 2019-07-31 MED ORDER — GUAIFENESIN-DM 100-10 MG/5ML PO SYRP: 10.0000 mL | ORAL_SOLUTION | ORAL | 0 refills | Status: DC | PRN

## 2019-07-31 MED ORDER — ACETAMINOPHEN 325 MG PO TABS: 650.0000 mg | ORAL_TABLET | Freq: Four times a day (QID) | ORAL | Status: DC | PRN

## 2019-07-31 NOTE — Evaluation (Signed)
Physical Therapy Evaluation Patient Details Name: Robin Moreno MRN: [id number] DOB: April 22, 1925 Today's Date: 07/31/2019   History of Present Illness  86 year old female admitted 07/30/19 due to pre-syncope at home, found to be hypotensive in the ED. Patient also found to be COVID positive. PMH: RA per son, HTN, HLD     Clinical Impression  Patient presents at/near her baseline per son's description via telephone. Patient has not ambulated in months. He cares for her bed level and assists her to the wheelchair sometimes. Patient is limited due to her RA per son. Patient tolerated sitting EOB approx less than 5 minutes, BP stable but patient reported dizziness that did not subside. Patient presents with decreased ROM bilat ankles and knees. Patient's son is sole caregiver for patient and has limited DME. She did have home PT prior to admission. If son can continue to provide 24/7 care for patient, recommend home PT resumes and patient discharge with hospital bed, BSC, hoyer lift. If son is unable to care for patient at this level, recommend SNF.   BP supine in bed with HOB 41 degrees: 118/58 BP sitting EOB, patient reports dizziness: 126/63, HR 87 bpm    Follow Up Recommendations SNF;Supervision/Assistance - 24 hour;Home health PT((pending son's ability to care for patient at this level))    Equipment Recommendations  3in1 (PT);Wheelchair (measurements PT);Wheelchair cushion (measurements PT);Hospital bed;Other (comment)(hoyer lift)       Precautions / Restrictions Precautions Precautions: Fall;Other (comment) Precaution Comments: HOH Restrictions Weight Bearing Restrictions: No      Mobility  Bed Mobility Overal bed mobility: Needs Assistance Bed Mobility: Supine to Sit;Rolling;Sit to Supine Rolling: Max assist;+2 for physical assistance;+2 for safety/equipment   Supine to sit: Max assist;HOB elevated Sit to supine: Max assist   General bed mobility comments: Encouraged patient to  use bedrail, HOB elevated 41 degrees for supine>sit, use of draw sheet to assist patient's hips to EOB    Balance Overall balance assessment: Needs assistance Sitting-balance support: Feet supported;Bilateral upper extremity supported Sitting balance-Leahy Scale: (Fair progressing to Poor as she fatigues) Sitting balance - Comments: sitting tolerance EOB less than 5 minutes,  Postural control: Right lateral lean;Posterior lean(with fatigue sitting EOB)  Standing balance comment: not assessed      Pertinent Vitals/Pain Pain Assessment: 0-10 Pain Score: 9  Pain Location: bilat feet Pain Descriptors / Indicators: Sore Pain Intervention(s): Limited activity within patient's tolerance;Monitored during session;Repositioned    Home Living Family/patient expects to be discharged to:: Private residence Living Arrangements: Children(son, Daryle, is primary caregiver) Available Help at Discharge: Family   Home Access: Ramped entrance     Home Layout: One level Home Equipment: Walker - standard;Wheelchair - manual((need to confirm if manual w/c or transport chair)) Additional Comments: ramp to enter the home    Prior Function Level of Independence: Needs assistance   Gait / Transfers Assistance Needed: Patient hasn't walked in months per son due to her RA  ADL's / Homemaking Assistance Needed: Son assists patient with care bed level, assists her to the wheelchair sometimes.   Comments: wheelchair, SW, hasn't walked in months due to RA        Extremity/Trunk Assessment   Upper Extremity Assessment Upper Extremity Assessment: Defer to OT evaluation    Lower Extremity Assessment Lower Extremity Assessment: RLE deficits/detail;LLE deficits/detail RLE Deficits / Details: impaired ankle DF ROM approx 40% of normal PROM, impaired knee flexion ROM to approx 70 degrees sitting EOB LLE Deficits / Details: impaired DF PROM to  approx 40% of normal, impaired knee flexion ROM approx 70  degrees sitting EOB    Communication   Communication: HOH  Cognition Arousal/Alertness: Awake/alert  General Comments: oriented to month/year, place, situation just [facility]     General Comments General comments (skin integrity, edema, etc.): BP stable sitting EOB but patient reports dizziness that did not subside.        Assessment/Plan    PT Assessment Patient needs continued PT services  PT Problem List Decreased strength;Decreased range of motion;Decreased activity tolerance;Decreased balance;Decreased mobility       PT Treatment Interventions DME instruction;Functional mobility training;Therapeutic activities;Therapeutic exercise;Balance training;Patient/family education;Wheelchair mobility training    PT Goals (Current goals can be found in the Care Plan section)  Acute Rehab PT Goals Time For Goal Achievement: 08/13/19 Potential to Achieve Goals: Fair    Frequency Min 3X/week   Barriers to discharge  son is only caregiver       AM-PAC PT "6 Clicks" Mobility  Outcome Measure Help needed turning from your back to your side while in a flat bed without using bedrails?: A Lot Help needed moving from lying on your back to sitting on the side of a flat bed without using bedrails?: A Lot Help needed moving to and from a bed to a chair (including a wheelchair)?: Total Help needed standing up from a chair using your arms (e.g., wheelchair or bedside chair)?: Total Help needed to walk in hospital room?: Total Help needed climbing 3-5 steps with a railing? : Total 6 Click Score: 8    End of Session   Activity Tolerance: Patient limited by fatigue Patient left: in bed;with call bell/phone within reach;with bed alarm set Nurse Communication: Mobility status;Need for lift equipment PT Visit Diagnosis: Muscle weakness (generalized) (M62.81);Other abnormalities of gait and mobility (R26.89)    Time: 3762-8315 PT Time Calculation (min) (ACUTE ONLY): 44 min   Charges:   PT  Evaluation $PT Eval Moderate Complexity: 1 Mod         Angelene Giovanni, PT, DPT Acute Rehab [phone] office   Angelene Giovanni 07/31/2019, 10:26 AM

## 2019-07-31 NOTE — Progress Notes (Signed)
   07/31/19 0208  Assess: MEWS Score  Temp 98 F (36.7 C)  BP 125/61  Pulse Rate 64  ECG Heart Rate 65  Resp 18  Level of Consciousness Alert  SpO2 99 %  O2 Device Room Air  Patient Activity (if Appropriate) In bed  Assess: MEWS Score  MEWS Temp 0  MEWS Systolic 0  MEWS Pulse 0  MEWS RR 0  MEWS LOC 0  MEWS Score 0  MEWS Score Color Green  Assess: if the MEWS score is Yellow or Red  Were vital signs taken at a resting state? Yes  Focused Assessment Documented focused assessment  Early Detection of Sepsis Score *See Row Information* Low  MEWS guidelines implemented *See Row Information* No, other (Comment) (no acute changes)

## 2019-07-31 NOTE — Plan of Care (Signed)
  Problem: Education: Goal: Knowledge of General Education information will improve Description: Including pain rating scale, medication(s)/side effects and non-pharmacologic comfort measures Outcome: Progressing   Problem: Health Behavior/Discharge Planning: Goal: Ability to manage health-related needs will improve Outcome: Progressing   Problem: Clinical Measurements: Goal: Ability to maintain clinical measurements within normal limits will improve Outcome: Progressing Goal: Will remain free from infection Outcome: Progressing Goal: Diagnostic test results will improve Outcome: Progressing Goal: Respiratory complications will improve Outcome: Progressing Goal: Cardiovascular complication will be avoided Outcome: Progressing   Problem: Activity: Goal: Risk for activity intolerance will decrease Outcome: Progressing   Problem: Nutrition: Goal: Adequate nutrition will be maintained Outcome: Progressing   Problem: Coping: Goal: Level of anxiety will decrease Outcome: Progressing   Problem: Elimination: Goal: Will not experience complications related to bowel motility Outcome: Progressing Goal: Will not experience complications related to urinary retention Outcome: Progressing   Problem: Pain Managment: Goal: General experience of comfort will improve Outcome: Progressing   Problem: Safety: Goal: Ability to remain free from injury will improve Outcome: Progressing   Problem: Skin Integrity: Goal: Risk for impaired skin integrity will decrease Outcome: Progressing   Problem: Education: Goal: Knowledge of risk factors and measures for prevention of condition will improve Outcome: Progressing   Problem: Coping: Goal: Psychosocial and spiritual needs will be supported Outcome: Progressing   Problem: Respiratory: Goal: Will maintain a patent airway Outcome: Progressing Goal: Complications related to the disease process, condition or treatment will be avoided or  minimized Outcome: Progressing   Problem: Education: Goal: Knowledge of condition and prescribed therapy will improve Outcome: Progressing   Problem: Cardiac: Goal: Will achieve and/or maintain adequate cardiac output Outcome: Progressing   Problem: Physical Regulation: Goal: Complications related to the disease process, condition or treatment will be avoided or minimized Outcome: Progressing

## 2019-07-31 NOTE — Discharge Instructions (Signed)
Person Under Monitoring Name: Robin Moreno  Location: [address] [place] [address]   Infection Prevention Recommendations for Individuals Confirmed to have, or Being Evaluated for, 2019 Novel Coronavirus (COVID-19) Infection Who Receive Care at Home  Individuals who are confirmed to have, or are being evaluated for, COVID-19 should follow the prevention steps below until a healthcare provider or local or state health department says they can return to normal activities.  Stay home except to get medical care You should restrict activities outside your home, except for getting medical care. Do not go to work, school, or public areas, and do not use public transportation or taxis.  Call ahead before visiting your doctor Before your medical appointment, call the healthcare provider and tell them that you have, or are being evaluated for, COVID-19 infection. This will help the healthcare provider's office take steps to keep other people from getting infected. Ask your healthcare provider to call the local or state health department.  Monitor your symptoms Seek prompt medical attention if your illness is worsening (e.g., difficulty breathing). Before going to your medical appointment, call the healthcare provider and tell them that you have, or are being evaluated for, COVID-19 infection. Ask your healthcare provider to call the local or state health department.  Wear a facemask You should wear a facemask that covers your nose and mouth when you are in the same room with other people and when you visit a healthcare provider. People who live with or visit you should also wear a facemask while they are in the same room with you.  Separate yourself from other people in your home As much as possible, you should stay in a different room from other people in your home. Also, you should use a separate bathroom, if available.  Avoid sharing household items You should not share  dishes, drinking glasses, cups, eating utensils, towels, bedding, or other items with other people in your home. After using these items, you should wash them thoroughly with soap and water.  Cover your coughs and sneezes Cover your mouth and nose with a tissue when you cough or sneeze, or you can cough or sneeze into your sleeve. Throw used tissues in a lined trash can, and immediately wash your hands with soap and water for at least 20 seconds or use an alcohol-based hand rub.  Wash your [employer] your hands often and thoroughly with soap and water for at least 20 seconds. You can use an alcohol-based hand sanitizer if soap and water are not available and if your hands are not visibly dirty. Avoid touching your eyes, nose, and mouth with unwashed hands.   Prevention Steps for Caregivers and Household Members of Individuals Confirmed to have, or Being Evaluated for, COVID-19 Infection Being Cared for in the Home  If you live with, or provide care at home for, a person confirmed to have, or being evaluated for, COVID-19 infection please follow these guidelines to prevent infection:  Follow healthcare provider's instructions Make sure that you understand and can help the patient follow any healthcare provider instructions for all care.  Provide for the patient's basic needs You should help the patient with basic needs in the home and provide support for getting groceries, prescriptions, and other personal needs.  Monitor the patient's symptoms If they are getting sicker, call his or her medical provider and tell them that the patient has, or is being evaluated for, COVID-19 infection. This will help the healthcare provider's office  take steps to keep other people from getting infected. Ask the healthcare provider to call the local or state health department.  Limit the number of people who have contact with the patient  If possible, have only one caregiver for the patient.  Other  household members should stay in another home or place of residence. If this is not possible, they should stay  in another room, or be separated from the patient as much as possible. Use a separate bathroom, if available.  Restrict visitors who do not have an essential need to be in the home.  Keep older adults, very young children, and other sick people away from the patient Keep older adults, very young children, and those who have compromised immune systems or chronic health conditions away from the patient. This includes people with chronic heart, lung, or kidney conditions, diabetes, and cancer.  Ensure good ventilation Make sure that shared spaces in the home have good air flow, such as from an air conditioner or an opened window, weather permitting.  Wash your hands often  Wash your hands often and thoroughly with soap and water for at least 20 seconds. You can use an alcohol based hand sanitizer if soap and water are not available and if your hands are not visibly dirty.  Avoid touching your eyes, nose, and mouth with unwashed hands.  Use disposable paper towels to dry your hands. If not available, use dedicated cloth towels and replace them when they become wet.  Wear a facemask and gloves  Wear a disposable facemask at all times in the room and gloves when you touch or have contact with the patient's blood, body fluids, and/or secretions or excretions, such as sweat, saliva, sputum, nasal mucus, vomit, urine, or feces.  Ensure the mask fits over your nose and mouth tightly, and do not touch it during use.  Throw out disposable facemasks and gloves after using them. Do not reuse.  Wash your hands immediately after removing your facemask and gloves.  If your personal clothing becomes contaminated, carefully remove clothing and launder. Wash your hands after handling contaminated clothing.  Place all used disposable facemasks, gloves, and other waste in a lined container before  disposing them with other household waste.  Remove gloves and wash your hands immediately after handling these items.  Do not share dishes, glasses, or other household items with the patient  Avoid sharing household items. You should not share dishes, drinking glasses, cups, eating utensils, towels, bedding, or other items with a patient who is confirmed to have, or being evaluated for, COVID-19 infection.  After the person uses these items, you should wash them thoroughly with soap and water.  Wash laundry thoroughly  Immediately remove and wash clothes or bedding that have blood, body fluids, and/or secretions or excretions, such as sweat, saliva, sputum, nasal mucus, vomit, urine, or feces, on them.  Wear gloves when handling laundry from the patient.  Read and follow directions on labels of laundry or clothing items and detergent. In general, wash and dry with the warmest temperatures recommended on the label.  Clean all areas the individual has used often  Clean all touchable surfaces, such as counters, tabletops, doorknobs, bathroom fixtures, toilets, phones, keyboards, tablets, and bedside tables, every day. Also, clean any surfaces that may have blood, body fluids, and/or secretions or excretions on them.  Wear gloves when cleaning surfaces the patient has come in contact with.  Use a diluted bleach solution (e.g., dilute bleach with 1 part  bleach and 10 parts water) or a household disinfectant with a label that says EPA-registered for coronaviruses. To make a bleach solution at home, add 1 tablespoon of bleach to 1 quart (4 cups) of water. For a larger supply, add  cup of bleach to 1 gallon (16 cups) of water.  Read labels of cleaning products and follow recommendations provided on product labels. Labels contain instructions for safe and effective use of the cleaning product including precautions you should take when applying the product, such as wearing gloves or eye protection  and making sure you have good ventilation during use of the product.  Remove gloves and wash hands immediately after cleaning.  Monitor yourself for signs and symptoms of illness Caregivers and household members are considered close contacts, should monitor their health, and will be asked to limit movement outside of the home to the extent possible. Follow the monitoring steps for close contacts listed on the symptom monitoring form.   ? If you have additional questions, contact your local health department or call the epidemiologist on call at [phone] (available 24/7). ? This guidance is subject to change. For the most up-to-date guidance from [facility], please refer to their website: [email or web address]

## 2019-07-31 NOTE — Evaluation (Addendum)
Occupational Therapy Evaluation Patient Details Name: Robin Moreno MRN: [id number] DOB: 1926-03-25 Today's Date: 07/31/2019    History of Present Illness 86 year old female admitted 07/30/19 due to pre-syncope at home, found to be hypotensive in the ED. Patient also found to be COVID positive. PMH: RA per son, HTN, HLD    Clinical Impression   This 86 y/o female presents with the above. PTA pt residing at home with son, per son has been mostly bedbound for last few months (occasionally transfers to wheelchair); son assisting with ADL tasks at bed level and pt reports she is able to complete self-feeding. Pt very HOH but overall pleasant and agreeable to working with therapy. Pt requiring at least maxA for bed mobility (rolling), up to modA for UB ADL, totalA for LB/toileting ADL, and setup assist for simple grooming ADL tasks. Pt overall following simple commands and appears to be accurate historian regarding PLOF. She will benefit from continued acute OT services; pt is currently appropriate for SNF level of care/therapy services at time of discharge. If son opting for pt to return home recommend max HH services to optimize her overall safety and independence with ADL, mobility, and to reduce overall level of caregiver burden. Pt may also benefit from prevalon boots to reduce risk for pressure/skin breakdown in bil LEs.     Follow Up Recommendations  Supervision/Assistance - 24 hour;SNF;Home health OT(pending son able to assist)    Equipment Recommendations  Hospital bed;Other (comment)(hoyer)           Precautions / Restrictions Precautions Precautions: Fall;Other (comment) Precaution Comments: HOH Restrictions Weight Bearing Restrictions: No      Mobility Bed Mobility Overal bed mobility: Needs Assistance Bed Mobility: Supine to Sit;Rolling;Sit to Supine Rolling: Max assist   Supine to sit: Max assist;HOB elevated Sit to supine: Max assist   General bed mobility comments:  rolling to R side, maxA for partial roll using bedrail  Transfers                      Balance Overall balance assessment: Needs assistance Sitting-balance support: Feet supported;Bilateral upper extremity supported Sitting balance-Leahy Scale: (Fair progressing to Poor as she fatigues) Sitting balance - Comments: sitting tolerance EOB less than 5 minutes,  Postural control: Right lateral lean;Posterior lean(with fatigue sitting EOB)     Standing balance comment: not assessed                           ADL either performed or assessed with clinical judgement   ADL Overall ADL's : Needs assistance/impaired Eating/Feeding: Set up;Sitting;Bed level   Grooming: Wash/dry face;Set up;Supervision/safety;Bed level   Upper Body Bathing: Minimal assistance;Bed level   Lower Body Bathing: Maximal assistance;Bed level   Upper Body Dressing : Minimal assistance;Bed level   Lower Body Dressing: Total assistance;Bed level       Toileting- Clothing Manipulation and Hygiene: Total assistance;Bed level         General ADL Comments: pt with weakness, decreased mobility and ADL status                         Pertinent Vitals/Pain Pain Assessment: Faces Pain Score: 9  Faces Pain Scale: Hurts little more Pain Location: generalized Pain Descriptors / Indicators: Discomfort Pain Intervention(s): Limited activity within patient's tolerance;Monitored during session;Repositioned     Hand Dominance     Extremity/Trunk Assessment Upper Extremity Assessment Upper Extremity  Assessment: Generalized weakness   Lower Extremity Assessment Lower Extremity Assessment: Defer to PT evaluation RLE Deficits / Details: impaired ankle DF ROM approx 40% of normal PROM, impaired knee flexion ROM to approx 70 degrees sitting EOB LLE Deficits / Details: impaired DF PROM to approx 40% of normal, impaired knee flexion ROM approx 70 degrees sitting EOB        Communication  Communication Communication: HOH   Cognition Arousal/Alertness: Awake/alert Behavior During Therapy: WFL for tasks assessed/performed Overall Cognitive Status: Within Functional Limits for tasks assessed                                 General Comments: for basic tasks today, didn't formally assess - pt very HOH so difficult hearing questions, when answering questions related to home setup/PLOF pt appears to be accurate historian   General Comments  VSS on RA    Exercises Exercises: Other exercises Other Exercises Other Exercises: lateral/trunk rotation while in bridge position, to L/R   Shoulder Instructions      Home Living Family/patient expects to be discharged to:: Private residence Living Arrangements: Children(son, Chiropodist, is primary caregiver) Available Help at Discharge: Family   Home Access: Ramped entrance     Home Layout: One level     Bathroom Shower/Tub: (bathes at bed level )         Home Equipment: Walker - standard;Wheelchair - manual((need to confirm if manual w/c or transport chair))   Additional Comments: ramp to enter the home      Prior Functioning/Environment Level of Independence: Needs assistance  Gait / Transfers Assistance Needed: Patient hasn't walked in months per son due to her RA ADL's / Homemaking Assistance Needed: Son assists patient with care bed level, assists her to the wheelchair sometimes. Pt reports she is able to self-feed   Comments: wheelchair, SW, hasn't walked in months due to RA        OT Problem List: Decreased strength;Decreased range of motion;Decreased activity tolerance;Impaired balance (sitting and/or standing);Decreased safety awareness;Cardiopulmonary status limiting activity      OT Treatment/Interventions: Self-care/ADL training;Therapeutic exercise;Energy conservation;DME and/or AE instruction;Therapeutic activities;Visual/perceptual remediation/compensation;Balance training;Patient/family  education    OT Goals(Current goals can be found in the care plan section) Acute Rehab OT Goals Patient Stated Goal: none stated, agreeable to working with therapies OT Goal Formulation: With patient Time For Goal Achievement: 08/14/19 Potential to Achieve Goals: Fair  OT Frequency: Min 2X/week   Barriers to D/C:            Co-evaluation              AM-PAC OT "6 Clicks" Daily Activity     Outcome Measure Help from another person eating meals?: A Little Help from another person taking care of personal grooming?: A Little Help from another person toileting, which includes using toliet, bedpan, or urinal?: Total Help from another person bathing (including washing, rinsing, drying)?: A Lot Help from another person to put on and taking off regular upper body clothing?: A Lot Help from another person to put on and taking off regular lower body clothing?: Total 6 Click Score: 12   End of Session Nurse Communication: Mobility status;Need for lift equipment  Activity Tolerance: Patient tolerated treatment well Patient left: in bed;with call bell/phone within reach;with bed alarm set  OT Visit Diagnosis: Muscle weakness (generalized) (M62.81);Other abnormalities of gait and mobility (R26.89)  Time: 1050-1107 OT Time Calculation (min): 17 min Charges:  OT General Charges $OT Visit: 1 Visit OT Evaluation $OT Eval Moderate Complexity: 1 Mod  Marcy Siren, OT Acute Rehabilitation Services Pager [phone] Office [phone]   Orlando Penner 07/31/2019, 12:04 PM

## 2019-07-31 NOTE — Progress Notes (Signed)
Robin Moreno was admitted to 5w02 from the ED via stretcher.  The patient is alert and oriented to person, place, and situation.  The patient is very hard of hearing.  Bed is in the lowest position, bed alarm activated.  Call bell and telephone are within reach.  Explained to patient how to use call bell and telephone, patient indicated understanding.  Pt requested warm blankets, which were given.  No other needs at this time.

## 2019-07-31 NOTE — TOC Transition Note (Addendum)
Transition of Care [facility]) - CM/SW Discharge Note   Patient Details  Name: Robin Moreno MRN: [id number] Date of Birth: 12-04-1925  [facility] CM/SW Contact:  Cherylann Parr, RN Phone Number: 07/31/2019, 2:52 PM   Clinical Narrative:   Pt deemed stable for discharge home today.  Pt was bed bound/wheelchair bound PTA. CM spoke with pt via assistance of bedside nurse.  CM discussed discharge planning with both pt and son (caregiver Darryle) - both choosing to discharge home and refusing SNF.  CM discussed therapy assessment and recommendations - son informed CM that he will continue to provide 24/7 care in the home Darryle informed CM that his mom PTA was unable to tolerate 20 mins in the wheelchair - per PT this is in alignment with her assessment this am.  Son informed CM that pt is active with [facility] and doesn't have a need to leave the home - son can not transport pt outside of  the home and is interested in [place] [facility] referral for potential ambulance transport if the need arises - referral submitted via Epic.  Son/pt also interested in COVID at home program - CM made referral via e-mail. Pt's son declined all HH and informed CM that he fired the [facility] aide service 2 weeks ago. Son stated  he is not interested in restarting aide services, per son "I handle all that already".  Pt/son declined therapy recommendations of 3:1, hospital bed and hoyer lift. Pt confrims she has an adjustable bed frame at home already that can help her sit up in the bed.  Pt/son request transport home via [facility] - CM will provide transport pkg to nurse station and bedside nurse can call transport once pt is ready.  Darrryle informed CM that he will be home all evening to receive pt.     Discharge order signed - CM signing off  Update:  [facility] confirms they will  Accept pt and will provide services for 30 days   Final next level of care: Home/Self Care     Patient Goals and CMS  Choice Patient states their goals for this hospitalization and ongoing recovery are:: Pt did not state a goal      Discharge Placement                Patient to be transferred to facility by: [facility] Name of family member notified: Darryle Patient and family notified of of transfer: 07/31/19  Discharge Plan and Services                                     Social Determinants of Health (SDOH) Interventions     Readmission Risk Interventions No flowsheet data found.

## 2019-07-31 NOTE — Discharge Summary (Signed)
PATIENT DETAILS Name: Robin Moreno Age: 86 y.o. Sex: female Date of Birth: September 23, 1925 MRN: [id number]. Admitting Physician: Joselyn Arrow, MD VOZ:[id number], Channing Mutters, MD  Admit Date: 07/30/2019 Discharge date: 07/31/2019  Recommendations for Outpatient Follow-up:  1. Follow up with PCP in 1-2 weeks 2. Please obtain CMP/CBC in one week 3. Blood cultures negative so far-please follow until final  Admitted From:  Home  Disposition: Home with home health services   Home Health: Yes  Equipment/Devices: None  Discharge Condition: Stable  CODE STATUS: FULL CODE  Diet recommendation:  Diet Order            Diet regular Room service appropriate? Yes; Fluid consistency: Thin  Diet effective now        Diet - low sodium heart healthy               Brief Summary: See H&P, Labs, Consult and Test reports for all details in brief, patient is a 86 year old female with history of HTN, HLD, chronic debility-mostly bed to chair bound-lives with her son-brought in after she sustained a presyncopal episode while working with physical therapy.  Per history obtained-patient was having URI/cough-like symptoms for almost 1 week.  She was found to have COVID-19 infection and admitted to the hospitalist service  Brief Hospital Course: Presyncope: Occurred after working with physical therapy-in the background of likely ongoing COVID-19 infection.  Suspect likely vasovagal or orthostatic mechanism (blood pressure was soft ).  Per history obtained from patient's son-patient never passed out-she just got weak and laid back.  Clearly not a candidate for aggressive care-even if patient had cardiac etiology.  This MD explained to patient's son that she is not a candidate for pacemaker etc.  Telemetry-which was unremarkable-since not a candidate for aggressive care-further work-up would not change management.  COVID-19 infection: Ongoing symptoms for at least 1 week-diagnosed on admission.  On room air-CRP  only minimally elevated-was not coughing while I was in the room this morning-lungs are completely clear.  No evidence of pneumonia on chest x-ray/CT abdomen.  Suspect does not require remdesivir or steroids for the time being.  Very difficult situation-patient's son is unable to transport patient as she is mostly bedbound (hence he has not been able to get her the vaccine)-spoke with case management-to see if there were options in getting her to the infusion center for monoclonal antibody infusion-however given her bedbound status-and her inability to sit up in a wheelchair-transportation to the clinic is not possible per CM.   Have asked case management to work patient up with Covid home program-son aware that if her symptoms worsens-and she gets short of breath-to seek immediate medical attention.   COVID-19 Labs:  Recent Labs    07/31/19 0426  FERRITIN 720*  CRP 4.7*    Lab Results  Component Value Date   SARSCOV2NAA POSITIVE (A) 07/30/2019   SARSCOV2NAA NEGATIVE 09/30/2018   SARSCOV2NAA NEGATIVE 09/18/2018     HTN: BP is in the low 100s-continue to hold all antihypertensives.  Per patient's son-PCP does home visits-defer further adjustment/optimization of her antihypertensive regimen to her PCP.  HLD: Continue Crestor  Chronic debility/weakness: Mostly bedbound-Per patient's son he provides 24/7 care.  She apparently was just discharged from a local SNF-and he does not want her to go back.  Case management working on arranging home health services.  Palliative care/goals of care: Full code for now-tried explaining to patient's son that given her frailty-bedbound status-advanced age-she is really not a candidate for  aggressive care, I briefly spoke to him about DNR-he wants to think about it.  PCP to consider palliative care eval/follow-up at next visit.  Discharge Diagnoses:  Principal Problem:   COVID-19 virus infection Active Problems:   Generalized weakness   Essential  hypertension   Dyslipidemia   Pre-syncope   Discharge Instructions:    Person Under Monitoring Name: Robin Moreno  Location: [address] [place] [address]   Infection Prevention Recommendations for Individuals Confirmed to have, or Being Evaluated for, 2019 Novel Coronavirus (COVID-19) Infection Who Receive Care at Home  Individuals who are confirmed to have, or are being evaluated for, COVID-19 should follow the prevention steps below until a healthcare provider or local or state health department says they can return to normal activities.  Stay home except to get medical care You should restrict activities outside your home, except for getting medical care. Do not go to work, school, or public areas, and do not use public transportation or taxis.  Call ahead before visiting your doctor Before your medical appointment, call the healthcare provider and tell them that you have, or are being evaluated for, COVID-19 infection. This will help the healthcare provider's office take steps to keep other people from getting infected. Ask your healthcare provider to call the local or state health department.  Monitor your symptoms Seek prompt medical attention if your illness is worsening (e.g., difficulty breathing). Before going to your medical appointment, call the healthcare provider and tell them that you have, or are being evaluated for, COVID-19 infection. Ask your healthcare provider to call the local or state health department.  Wear a facemask You should wear a facemask that covers your nose and mouth when you are in the same room with other people and when you visit a healthcare provider. People who live with or visit you should also wear a facemask while they are in the same room with you.  Separate yourself from other people in your home As much as possible, you should stay in a different room from other people in your home. Also, you should use a  separate bathroom, if available.  Avoid sharing household items You should not share dishes, drinking glasses, cups, eating utensils, towels, bedding, or other items with other people in your home. After using these items, you should wash them thoroughly with soap and water.  Cover your coughs and sneezes Cover your mouth and nose with a tissue when you cough or sneeze, or you can cough or sneeze into your sleeve. Throw used tissues in a lined trash can, and immediately wash your hands with soap and water for at least 20 seconds or use an alcohol-based hand rub.  Wash your [employer] your hands often and thoroughly with soap and water for at least 20 seconds. You can use an alcohol-based hand sanitizer if soap and water are not available and if your hands are not visibly dirty. Avoid touching your eyes, nose, and mouth with unwashed hands.   Prevention Steps for Caregivers and Household Members of Individuals Confirmed to have, or Being Evaluated for, COVID-19 Infection Being Cared for in the Home  If you live with, or provide care at home for, a person confirmed to have, or being evaluated for, COVID-19 infection please follow these guidelines to prevent infection:  Follow healthcare provider's instructions Make sure that you understand and can help the patient follow any healthcare provider instructions for all care.  Provide for the patient's basic needs You should  help the patient with basic needs in the home and provide support for getting groceries, prescriptions, and other personal needs.  Monitor the patient's symptoms If they are getting sicker, call his or her medical provider and tell them that the patient has, or is being evaluated for, COVID-19 infection. This will help the healthcare provider's office take steps to keep other people from getting infected. Ask the healthcare provider to call the local or state health department.  Limit the number of people who have  contact with the patient  If possible, have only one caregiver for the patient.  Other household members should stay in another home or place of residence. If this is not possible, they should stay  in another room, or be separated from the patient as much as possible. Use a separate bathroom, if available.  Restrict visitors who do not have an essential need to be in the home.  Keep older adults, very young children, and other sick people away from the patient Keep older adults, very young children, and those who have compromised immune systems or chronic health conditions away from the patient. This includes people with chronic heart, lung, or kidney conditions, diabetes, and cancer.  Ensure good ventilation Make sure that shared spaces in the home have good air flow, such as from an air conditioner or an opened window, weather permitting.  Wash your hands often  Wash your hands often and thoroughly with soap and water for at least 20 seconds. You can use an alcohol based hand sanitizer if soap and water are not available and if your hands are not visibly dirty.  Avoid touching your eyes, nose, and mouth with unwashed hands.  Use disposable paper towels to dry your hands. If not available, use dedicated cloth towels and replace them when they become wet.  Wear a facemask and gloves  Wear a disposable facemask at all times in the room and gloves when you touch or have contact with the patient's blood, body fluids, and/or secretions or excretions, such as sweat, saliva, sputum, nasal mucus, vomit, urine, or feces.  Ensure the mask fits over your nose and mouth tightly, and do not touch it during use.  Throw out disposable facemasks and gloves after using them. Do not reuse.  Wash your hands immediately after removing your facemask and gloves.  If your personal clothing becomes contaminated, carefully remove clothing and launder. Wash your hands after handling contaminated  clothing.  Place all used disposable facemasks, gloves, and other waste in a lined container before disposing them with other household waste.  Remove gloves and wash your hands immediately after handling these items.  Do not share dishes, glasses, or other household items with the patient  Avoid sharing household items. You should not share dishes, drinking glasses, cups, eating utensils, towels, bedding, or other items with a patient who is confirmed to have, or being evaluated for, COVID-19 infection.  After the person uses these items, you should wash them thoroughly with soap and water.  Wash laundry thoroughly  Immediately remove and wash clothes or bedding that have blood, body fluids, and/or secretions or excretions, such as sweat, saliva, sputum, nasal mucus, vomit, urine, or feces, on them.  Wear gloves when handling laundry from the patient.  Read and follow directions on labels of laundry or clothing items and detergent. In general, wash and dry with the warmest temperatures recommended on the label.  Clean all areas the individual has used often  Clean all touchable surfaces,  such as counters, tabletops, doorknobs, bathroom fixtures, toilets, phones, keyboards, tablets, and bedside tables, every day. Also, clean any surfaces that may have blood, body fluids, and/or secretions or excretions on them.  Wear gloves when cleaning surfaces the patient has come in contact with.  Use a diluted bleach solution (e.g., dilute bleach with 1 part bleach and 10 parts water) or a household disinfectant with a label that says EPA-registered for coronaviruses. To make a bleach solution at home, add 1 tablespoon of bleach to 1 quart (4 cups) of water. For a larger supply, add  cup of bleach to 1 gallon (16 cups) of water.  Read labels of cleaning products and follow recommendations provided on product labels. Labels contain instructions for safe and effective use of the cleaning product  including precautions you should take when applying the product, such as wearing gloves or eye protection and making sure you have good ventilation during use of the product.  Remove gloves and wash hands immediately after cleaning.  Monitor yourself for signs and symptoms of illness Caregivers and household members are considered close contacts, should monitor their health, and will be asked to limit movement outside of the home to the extent possible. Follow the monitoring steps for close contacts listed on the symptom monitoring form.   ? If you have additional questions, contact your local health department or call the epidemiologist on call at [phone] (available 24/7). ? This guidance is subject to change. For the most up-to-date guidance from CDC, please refer to their website: [employer]    Activity:  As tolerated with Full fall precautions use walker/cane & assistance as needed  Discharge Instructions    Call MD for:  difficulty breathing, headache or visual disturbances   Complete by: As directed    Call MD for:  extreme fatigue   Complete by: As directed    Call MD for:  persistant dizziness or light-headedness   Complete by: As directed    Call MD for:  persistant nausea and vomiting   Complete by: As directed    Diet - low sodium heart healthy   Complete by: As directed    Discharge instructions   Complete by: As directed    1) continue to hold your blood pressure medications-as your blood pressure is well controlled-and does not require any blood pressure medications as of now.  Please talk to your primary care practitioner to see if these medications can be resumed.  2) if you develop shortness of breath-please seek immediate medical attention   Follow with Primary MD  Ralene Ok, MD in 1-2 weeks  Please get a complete blood count and chemistry panel checked by your Primary MD at your next visit,  and again as instructed by your Primary MD.  Get Medicines reviewed and adjusted: Please take all your medications with you for your next visit with your Primary MD  Laboratory/radiological data: Please request your Primary MD to go over all hospital tests and procedure/radiological results at the follow up, please ask your Primary MD to get all Hospital records sent to his/her office.  In some cases, they will be blood work, cultures and biopsy results pending at the time of your discharge. Please request that your primary care M.D. follows up on these results.  Also Note the following: If you experience worsening of your admission symptoms, develop shortness of breath, life threatening emergency, suicidal or homicidal thoughts you must seek medical attention immediately by calling 911 or calling your MD immediately  if symptoms less severe.  You must read complete instructions/literature along with all the possible adverse reactions/side effects for all the Medicines you take and that have been prescribed to you. Take any new Medicines after you have completely understood and accpet all the possible adverse reactions/side effects.   Do not drive when taking Pain medications or sleeping medications (Benzodaizepines)  Do not take more than prescribed Pain, Sleep and Anxiety Medications. It is not advisable to combine anxiety,sleep and pain medications without talking with your primary care practitioner  Special Instructions: If you have smoked or chewed Tobacco  in the last 2 yrs please stop smoking, stop any regular Alcohol  and or any Recreational drug use.  Wear Seat belts while driving.  Please note: You were cared for by a hospitalist during your hospital stay. Once you are discharged, your primary care physician will handle any further medical issues. Please note that NO REFILLS for any discharge medications will be authorized once you are discharged, as it is imperative that you return  to your primary care physician (or establish a relationship with a primary care physician if you do not have one) for your post hospital discharge needs so that they can reassess your need for medications and monitor your lab values.   Increase activity slowly   Complete by: As directed      Allergies as of 07/31/2019   No Known Allergies     Medication List    STOP taking these medications   diltiazem 60 MG tablet Commonly known as: CARDIZEM   enalapril 10 MG tablet Commonly known as: VASOTEC   hydrochlorothiazide 25 MG tablet Commonly known as: HYDRODIURIL     TAKE these medications   acetaminophen 325 MG tablet Commonly known as: Tylenol Take 2 tablets (650 mg total) by mouth every 6 (six) hours as needed.   aspirin EC 325 MG tablet Take 1 tablet (325 mg total) by mouth daily.   diclofenac sodium 1 % Gel Commonly known as: VOLTAREN Apply 4 g topically 4 (four) times daily. Apply to your left shoulder   gabapentin 300 MG capsule Commonly known as: Neurontin Take 300 mg (1 capsule) twice daily the first day and then begin 300 mg (1 capsule) 3 times daily thereafter. What changed:   how much to take  how to take this  when to take this   guaiFENesin-dextromethorphan 100-10 MG/5ML syrup Commonly known as: ROBITUSSIN DM Take 10 mLs by mouth every 4 (four) hours as needed for cough.   meclizine 12.5 MG tablet Commonly known as: ANTIVERT Take 1 tablet (12.5 mg total) by mouth 3 (three) times daily as needed for dizziness.   multivitamin with minerals Tabs tablet Take 1 tablet by mouth daily.   Muscle Rub 10-15 % Crea Apply 1 application topically as needed (for sore muscle).   oxyCODONE 5 MG immediate release tablet Commonly known as: Roxicodone Take 0.5 tablets (2.5 mg total) by mouth every 6 (six) hours as needed for severe pain.   rosuvastatin 10 MG tablet Commonly known as: CRESTOR Take 10 mg by mouth daily.   SYSTANE OP Place 1 drop into both eyes  every morning.   Vitamin D 50 MCG (2000 UT) tablet Take 2,000 Units by mouth daily.      Follow-up Information    Ralene Ok, MD. Schedule an appointment as soon as possible for a visit in 1 week(s).   Specialty: Internal Medicine Contact information: [address] [place] [address] [phone]  No Known Allergies   Other Procedures/Studies: CT ABDOMEN PELVIS W CONTRAST  Result Date: 07/30/2019 CLINICAL DATA:  Abdominal pain. EXAM: CT ABDOMEN AND PELVIS WITH CONTRAST TECHNIQUE: Multidetector CT imaging of the abdomen and pelvis was performed using the standard protocol following bolus administration of intravenous contrast. CONTRAST:  70mL OMNIPAQUE IOHEXOL 300 MG/ML  SOLN COMPARISON:  None. FINDINGS: Lower chest: There is atelectasis and trace bilateral pleural effusions.The heart size is normal. Hepatobiliary: The liver is normal. Cholelithiasis without acute inflammation.There is no biliary ductal dilation. Pancreas: Normal contours without ductal dilatation. No peripancreatic fluid collection. Spleen: Unremarkable. Adrenals/Urinary Tract: --Adrenal glands: Unremarkable. --Right kidney/ureter: No hydronephrosis or radiopaque kidney stones. --Left kidney/ureter: No hydronephrosis or radiopaque kidney stones. --Urinary bladder: Unremarkable. Stomach/Bowel: --Stomach/Duodenum: No hiatal hernia or other gastric abnormality. Normal duodenal course and caliber. --Small bowel: Unremarkable. --Colon: Unremarkable. --Appendix: Not visualized. No right lower quadrant inflammation or free fluid. Vascular/Lymphatic: Atherosclerotic calcification is present within the non-aneurysmal abdominal aorta, without hemodynamically significant stenosis. --No retroperitoneal lymphadenopathy. --No mesenteric lymphadenopathy. --No pelvic or inguinal lymphadenopathy. Reproductive: Status post hysterectomy. No adnexal mass. Other: No ascites or free air. The abdominal wall is normal.  Musculoskeletal. There are degenerative changes of the lumbar spine with a degenerative anterolisthesis of L4 on L5. There is no acute displaced fracture. There are advanced degenerative changes of the left hip. IMPRESSION: 1. No acute abdominopelvic abnormality. 2. Cholelithiasis without acute inflammation. Aortic Atherosclerosis (ICD10-I70.0). Electronically Signed   By: Katherine Mantle M.D.   On: 07/30/2019 21:06   DG Chest Port 1 View  Result Date: 07/30/2019 CLINICAL DATA:  Cough EXAM: PORTABLE CHEST 1 VIEW COMPARISON:  09/29/2018 FINDINGS: Heart and mediastinal contours are within normal limits. No focal opacities or effusions. No acute bony abnormality. IMPRESSION: No active disease. Electronically Signed   By: Charlett Nose M.D.   On: 07/30/2019 18:20     TODAY-DAY OF DISCHARGE:  Subjective:   [name] today seems relatively comfortable-she is not short of breath-denies any cough.  She is incredibly hard of hearing-but when she does understand-she responds appropriately.  Objective:   Blood pressure (!) 100/46, pulse 67, temperature 98 F (36.7 C), temperature source Oral, resp. rate (!) 22, SpO2 98 %.  Intake/Output Summary (Last 24 hours) at 07/31/2019 1437 Last data filed at 07/31/2019 0900 Gross per 24 hour  Intake 590 ml  Output --  Net 590 ml   There were no vitals filed for this visit.  Exam: Awake Alert, No new F.N deficits, Normal affect [place].AT,PERRAL Supple Neck,No JVD, No cervical lymphadenopathy appriciated.  Symmetrical Chest wall movement, Good air movement bilaterally, CTAB RRR,No Gallops,Rubs or new Murmurs, No Parasternal Heave +ve B.Sounds, Abd Soft, Non tender, No organomegaly appriciated, No rebound -guarding or rigidity. No Cyanosis, Clubbing or edema, No new Rash or bruise   PERTINENT RADIOLOGIC STUDIES: CT ABDOMEN PELVIS W CONTRAST  Result Date: 07/30/2019 CLINICAL DATA:  Abdominal pain. EXAM: CT ABDOMEN AND PELVIS WITH CONTRAST TECHNIQUE:  Multidetector CT imaging of the abdomen and pelvis was performed using the standard protocol following bolus administration of intravenous contrast. CONTRAST:  25mL OMNIPAQUE IOHEXOL 300 MG/ML  SOLN COMPARISON:  None. FINDINGS: Lower chest: There is atelectasis and trace bilateral pleural effusions.The heart size is normal. Hepatobiliary: The liver is normal. Cholelithiasis without acute inflammation.There is no biliary ductal dilation. Pancreas: Normal contours without ductal dilatation. No peripancreatic fluid collection. Spleen: Unremarkable. Adrenals/Urinary Tract: --Adrenal glands: Unremarkable. --Right kidney/ureter: No hydronephrosis or radiopaque kidney stones. --Left kidney/ureter: No hydronephrosis or  radiopaque kidney stones. --Urinary bladder: Unremarkable. Stomach/Bowel: --Stomach/Duodenum: No hiatal hernia or other gastric abnormality. Normal duodenal course and caliber. --Small bowel: Unremarkable. --Colon: Unremarkable. --Appendix: Not visualized. No right lower quadrant inflammation or free fluid. Vascular/Lymphatic: Atherosclerotic calcification is present within the non-aneurysmal abdominal aorta, without hemodynamically significant stenosis. --No retroperitoneal lymphadenopathy. --No mesenteric lymphadenopathy. --No pelvic or inguinal lymphadenopathy. Reproductive: Status post hysterectomy. No adnexal mass. Other: No ascites or free air. The abdominal wall is normal. Musculoskeletal. There are degenerative changes of the lumbar spine with a degenerative anterolisthesis of L4 on L5. There is no acute displaced fracture. There are advanced degenerative changes of the left hip. IMPRESSION: 1. No acute abdominopelvic abnormality. 2. Cholelithiasis without acute inflammation. Aortic Atherosclerosis (ICD10-I70.0). Electronically Signed   By: Constance Holster M.D.   On: 07/30/2019 21:06   DG Chest Port 1 View  Result Date: 07/30/2019 CLINICAL DATA:  Cough EXAM: PORTABLE CHEST 1 VIEW COMPARISON:   09/29/2018 FINDINGS: Heart and mediastinal contours are within normal limits. No focal opacities or effusions. No acute bony abnormality. IMPRESSION: No active disease. Electronically Signed   By: Rolm Baptise M.D.   On: 07/30/2019 18:20     PERTINENT LAB RESULTS: CBC: Recent Labs    07/30/19 1731 07/31/19 0426  WBC 4.3 3.0*  HGB 11.8* 10.7*  HCT 39.2 34.6*  PLT 199 184   CMET CMP     Component Value Date/Time   NA 134 (L) 07/31/2019 0426   K 4.6 07/31/2019 0426   CL 99 07/31/2019 0426   CO2 22 07/31/2019 0426   GLUCOSE 88 07/31/2019 0426   BUN 25 (H) 07/31/2019 0426   CREATININE 0.95 07/31/2019 0426   CALCIUM 8.7 (L) 07/31/2019 0426   PROT 6.8 07/31/2019 0426   ALBUMIN 2.7 (L) 07/31/2019 0426   AST 27 07/31/2019 0426   ALT 16 07/31/2019 0426   ALKPHOS 82 07/31/2019 0426   BILITOT 0.3 07/31/2019 0426   GFRNONAA 52 (L) 07/31/2019 0426   GFRAA >60 07/31/2019 0426    GFR CrCl cannot be calculated (Unknown ideal weight.). No results for input(s): LIPASE, AMYLASE in the last 72 hours. No results for input(s): CKTOTAL, CKMB, CKMBINDEX, TROPONINI in the last 72 hours. Invalid input(s): POCBNP No results for input(s): DDIMER in the last 72 hours. No results for input(s): HGBA1C in the last 72 hours. No results for input(s): CHOL, HDL, LDLCALC, TRIG, CHOLHDL, LDLDIRECT in the last 72 hours. No results for input(s): TSH, T4TOTAL, T3FREE, THYROIDAB in the last 72 hours.  Invalid input(s): FREET3 Recent Labs    07/31/19 0426  FERRITIN 720*   Coags: No results for input(s): INR in the last 72 hours.  Invalid input(s): PT Microbiology: Recent Results (from the past 240 hour(s))  Culture, blood (routine x 2)     Status: None (Preliminary result)   Collection Time: 07/30/19  7:00 PM   Specimen: BLOOD  Result Value Ref Range Status   Specimen Description BLOOD RIGHT UPPER ARM  Final   Special Requests BOTTLES DRAWN AEROBIC AND ANAEROBIC BCAV  Final   Culture   Final     NO GROWTH < 12 HOURS Performed at [facility], [place] [address], [place], [place] [address]    Report Status PENDING  Incomplete  Culture, blood (routine x 2)     Status: None (Preliminary result)   Collection Time: 07/30/19  7:14 PM   Specimen: BLOOD LEFT HAND  Result Value Ref Range Status   Specimen Description BLOOD LEFT HAND  Final   Special Requests   Final    BOTTLES DRAWN AEROBIC AND ANAEROBIC Blood Culture results may not be optimal due to an inadequate volume of blood received in culture bottles   Culture   Final    NO GROWTH < 12 HOURS Performed at [facility], [address], [place], [place] [address]    Report Status PENDING  Incomplete  Respiratory Panel by RT PCR (Flu A&B, Covid) - Nasopharyngeal Swab     Status: Abnormal   Collection Time: 07/30/19  7:38 PM   Specimen: Nasopharyngeal Swab  Result Value Ref Range Status   SARS Coronavirus 2 by RT PCR POSITIVE (A) NEGATIVE Final    Comment: RESULT CALLED TO, READ BACK BY AND VERIFIED WITH: B BECK RN 2153 07/30/19 A BROWNING (NOTE) SARS-CoV-2 target nucleic acids are DETECTED. SARS-CoV-2 RNA is generally detectable in upper respiratory specimens  during the acute phase of infection. Positive results are indicative of the presence of the identified virus, but do not rule out bacterial infection or co-infection with other pathogens not detected by the test. Clinical correlation with patient history and other diagnostic information is necessary to determine patient infection status. The expected result is Negative. Fact Sheet for Patients:  https://www.moore.com/ Fact Sheet for Healthcare Providers: https://www.young.biz/ This test is not yet approved or cleared by the [place] FDA and  has been authorized for detection and/or diagnosis of SARS-CoV-2 by FDA under an Emergency Use Authorization (EUA).  This EUA will remain in effect (meaning this test can be used)  for  the duration of  the COVID-19 declaration under Section 564(b)(1) of the Act, 21 U.S.C. section 360bbb-3(b)(1), unless the authorization is terminated or revoked sooner.    Influenza A by PCR NEGATIVE NEGATIVE Final   Influenza B by PCR NEGATIVE NEGATIVE Final    Comment: (NOTE) The Xpert Xpress SARS-CoV-2/FLU/RSV assay is intended as an aid in  the diagnosis of influenza from Nasopharyngeal swab specimens and  should not be used as a sole basis for treatment. Nasal washings and  aspirates are unacceptable for Xpert Xpress SARS-CoV-2/FLU/RSV  testing. Fact Sheet for Patients: https://www.moore.com/ Fact Sheet for Healthcare Providers: https://www.young.biz/ This test is not yet approved or cleared by the [place] FDA and  has been authorized for detection and/or diagnosis of SARS-CoV-2 by  FDA under an Emergency Use Authorization (EUA). This EUA will remain  in effect (meaning this test can be used) for the duration of the  Covid-19 declaration under Section 564(b)(1) of the Act, 21  U.S.C. section 360bbb-3(b)(1), unless the authorization is  terminated or revoked. Performed at [facility], [address], [place], [place] [address]     FURTHER DISCHARGE INSTRUCTIONS:  Get Medicines reviewed and adjusted: Please take all your medications with you for your next visit with your Primary MD  Laboratory/radiological data: Please request your Primary MD to go over all hospital tests and procedure/radiological results at the follow up, please ask your Primary MD to get all Hospital records sent to his/her office.  In some cases, they will be blood work, cultures and biopsy results pending at the time of your discharge. Please request that your primary care M.D. goes through all the records of your hospital data and follows up on these results.  Also Note the following: If you experience worsening of your admission symptoms,  develop shortness of breath, life threatening emergency, suicidal or homicidal thoughts you must seek medical attention immediately by calling 911  or calling your MD immediately  if symptoms less severe.  You must read complete instructions/literature along with all the possible adverse reactions/side effects for all the Medicines you take and that have been prescribed to you. Take any new Medicines after you have completely understood and accpet all the possible adverse reactions/side effects.   Do not drive when taking Pain medications or sleeping medications (Benzodaizepines)  Do not take more than prescribed Pain, Sleep and Anxiety Medications. It is not advisable to combine anxiety,sleep and pain medications without talking with your primary care practitioner  Special Instructions: If you have smoked or chewed Tobacco  in the last 2 yrs please stop smoking, stop any regular Alcohol  and or any Recreational drug use.  Wear Seat belts while driving.  Please note: You were cared for by a hospitalist during your hospital stay. Once you are discharged, your primary care physician will handle any further medical issues. Please note that NO REFILLS for any discharge medications will be authorized once you are discharged, as it is imperative that you return to your primary care physician (or establish a relationship with a primary care physician if you do not have one) for your post hospital discharge needs so that they can reassess your need for medications and monitor your lab values.  Total Time spent coordinating discharge including counseling, education and face to face time equals 35 minutes.  SignedJeoffrey Massed 07/31/2019 2:37 PM

## 2019-07-31 NOTE — Progress Notes (Signed)
   07/31/19 2222  Vitals  BP 136/68  MAP (mmHg) 86  Pulse Rate 76  ECG Heart Rate 77  Resp (!) 22  Oxygen Therapy  SpO2 97 %  O2 Device Room Air  MEWS Score  MEWS Temp 0  MEWS Systolic 0  MEWS Pulse 0  MEWS RR 1  MEWS LOC 0  MEWS Score 1  MEWS Score Color Green  Pt transported home by [facility] wearing face covering Pt's son notified by phone Pt in no distress and vitals WDL Pt educated on discharge and had no questions, complaints, or concerns verbalized

## 2019-08-02 ENCOUNTER — Telehealth: Payer: Self-pay | Admitting: Physician Assistant

## 2019-08-02 NOTE — Telephone Encounter (Signed)
I connected by phone with Robin Moreno and/or patient's caregiver on 08/02/2019 at 7:30 PM to discuss the potential vaccination through our Homebound vaccination initiative.   Prevaccination Checklist for COVID-19 Vaccines  1.  Are you feeling sick today? yes  2.  Have you ever received a dose of a COVID-19 vaccine?  no      If yes, which one? None   3.  Have you ever had an allergic reaction: (This would include a severe reaction [ e.g., anaphylaxis] that required treatment with epinephrine or EpiPen or that caused you to go to the hospital.  It would also include an allergic reaction that occurred within 4 hours that caused hives, swelling, or respiratory distress, including wheezing.) A.  A previous dose of COVID-19 vaccine. no  B.  A vaccine or injectable therapy that contains multiple components, one of which is a COVID-19 vaccine component, but it is not known which component elicited the immediate reaction. no  C.  Are you allergic to polyethylene glycol? no   4.  Have you ever had an allergic reaction to another vaccine (other than COVID-19 vaccine) or an injectable medication? (This would include a severe reaction [ e.g., anaphylaxis] that required treatment with epinephrine or EpiPen or that caused you to go to the hospital.  It would also include an allergic reaction that occurred within 4 hours that caused hives, swelling, or respiratory distress, including wheezing.)  no   5.  Have you ever had a severe allergic reaction (e.g., anaphylaxis) to something other than a component of the COVID-19 vaccine, or any vaccine or injectable medication?  This would include food, pet, venom, environmental, or oral medication allergies.  no   6.  Have you received any vaccine in the last 14 days? no   7.  Have you ever had a positive test for COVID-19 or has a doctor ever told you that you had COVID-19?  yes on 07/30/19   8.  Have you received passive antibody therapy (monoclonal antibodies or  convalescent serum) as a treatment for COVID-19? no   9.  Do you have a weakened immune system caused by something such as HIV infection or cancer or do you take immunosuppressive drugs or therapies?  no   10.  Do you have a bleeding disorder or are you taking a blood thinner? no   11.  Are you pregnant or breast-feeding? no   12.  Do you have dermal fillers? no   __________________   This patient is a 86 y.o. female that meets the FDA criteria to receive homebound vaccination. Patient or parent/caregiver understands they have the option to accept or refuse homebound vaccination.  Patient passed the pre-screening checklist and would like to proceed with homebound vaccination.  Based on questionnaire above, I recommend the patient be observed for 15 minutes.  There are an estimated 2 other household members/caregivers who are also interested in receiving the vaccine.    Patient is currently sick and on quarantine. Patient will be eligible for homebound vaccination on the following date: _6/14/21_.  I will send the patient's information to our scheduling team who will reach out to schedule the patient and potential caregiver/family members for homebound vaccination.   Cline Crock PA-C 08/02/2019 7:30 PM

## 2019-08-04 LAB — CULTURE, BLOOD (ROUTINE X 2)
Culture: NO GROWTH
Culture: NO GROWTH

## 2019-08-31 ENCOUNTER — Observation Stay (HOSPITAL_COMMUNITY)
Admission: EM | Admit: 2019-08-31 | Discharge: 2019-09-02 | Disposition: A | Discharge status: Home / Self Care | Payer: Medicare Other | Attending: Internal Medicine | Admitting: Internal Medicine

## 2019-08-31 ENCOUNTER — Emergency Department (HOSPITAL_COMMUNITY): Payer: Medicare Other

## 2019-08-31 ENCOUNTER — Other Ambulatory Visit: Payer: Self-pay

## 2019-08-31 DIAGNOSIS — Z7982 Long term (current) use of aspirin: Secondary | ICD-10-CM | POA: Insufficient documentation

## 2019-08-31 DIAGNOSIS — R4781 Slurred speech: Secondary | ICD-10-CM | POA: Insufficient documentation

## 2019-08-31 DIAGNOSIS — R4701 Aphasia: Secondary | ICD-10-CM | POA: Insufficient documentation

## 2019-08-31 DIAGNOSIS — Z79899 Other long term (current) drug therapy: Secondary | ICD-10-CM | POA: Insufficient documentation

## 2019-08-31 DIAGNOSIS — R253 Fasciculation: Principal | ICD-10-CM | POA: Insufficient documentation

## 2019-08-31 DIAGNOSIS — N183 Chronic kidney disease, stage 3 unspecified: Secondary | ICD-10-CM | POA: Insufficient documentation

## 2019-08-31 DIAGNOSIS — E782 Mixed hyperlipidemia: Secondary | ICD-10-CM | POA: Diagnosis not present

## 2019-08-31 DIAGNOSIS — G8191 Hemiplegia, unspecified affecting right dominant side: Secondary | ICD-10-CM | POA: Diagnosis not present

## 2019-08-31 DIAGNOSIS — R251 Tremor, unspecified: Secondary | ICD-10-CM | POA: Diagnosis not present

## 2019-08-31 DIAGNOSIS — N1831 Chronic kidney disease, stage 3a: Secondary | ICD-10-CM | POA: Diagnosis present

## 2019-08-31 DIAGNOSIS — I129 Hypertensive chronic kidney disease with stage 1 through stage 4 chronic kidney disease, or unspecified chronic kidney disease: Secondary | ICD-10-CM | POA: Diagnosis not present

## 2019-08-31 DIAGNOSIS — Z66 Do not resuscitate: Secondary | ICD-10-CM | POA: Diagnosis present

## 2019-08-31 DIAGNOSIS — U071 COVID-19: Secondary | ICD-10-CM | POA: Diagnosis present

## 2019-08-31 DIAGNOSIS — I10 Essential (primary) hypertension: Secondary | ICD-10-CM | POA: Diagnosis present

## 2019-08-31 DIAGNOSIS — G514 Facial myokymia: Secondary | ICD-10-CM | POA: Diagnosis present

## 2019-08-31 DIAGNOSIS — E785 Hyperlipidemia, unspecified: Secondary | ICD-10-CM | POA: Diagnosis present

## 2019-08-31 LAB — COMPREHENSIVE METABOLIC PANEL
ALT: 15 U/L (ref 0–44)
AST: 24 U/L (ref 15–41)
Albumin: 3 g/dL — ABNORMAL LOW (ref 3.5–5.0)
Alkaline Phosphatase: 91 U/L (ref 38–126)
Anion gap: 12 (ref 5–15)
BUN: 18 mg/dL (ref 8–23)
CO2: 22 mmol/L (ref 22–32)
Calcium: 9 mg/dL (ref 8.9–10.3)
Chloride: 103 mmol/L (ref 98–111)
Creatinine, Ser: 1.15 mg/dL — ABNORMAL HIGH (ref 0.44–1.00)
GFR calc Af Amer: 48 mL/min — ABNORMAL LOW (ref >60)
GFR calc non Af Amer: 41 mL/min — ABNORMAL LOW (ref >60)
Glucose, Bld: 220 mg/dL — ABNORMAL HIGH (ref 70–99)
Potassium: 3.8 mmol/L (ref 3.5–5.1)
Sodium: 137 mmol/L (ref 135–145)
Total Bilirubin: 0.5 mg/dL (ref 0.3–1.2)
Total Protein: 6.9 g/dL (ref 6.5–8.1)

## 2019-08-31 LAB — I-STAT CHEM 8, ED
BUN: 20 mg/dL (ref 8–23)
Calcium, Ion: 1.23 mmol/L (ref 1.15–1.40)
Chloride: 104 mmol/L (ref 98–111)
Creatinine, Ser: 1.1 mg/dL — ABNORMAL HIGH (ref 0.44–1.00)
Glucose, Bld: 220 mg/dL — ABNORMAL HIGH (ref 70–99)
HCT: 33 % — ABNORMAL LOW (ref 36.0–46.0)
Hemoglobin: 11.2 g/dL — ABNORMAL LOW (ref 12.0–15.0)
Potassium: 3.6 mmol/L (ref 3.5–5.1)
Sodium: 139 mmol/L (ref 135–145)
TCO2: 22 mmol/L (ref 22–32)

## 2019-08-31 LAB — CBC
HCT: 35.8 % — ABNORMAL LOW (ref 36.0–46.0)
Hemoglobin: 10.9 g/dL — ABNORMAL LOW (ref 12.0–15.0)
MCH: 29 pg (ref 26.0–34.0)
MCHC: 30.4 g/dL (ref 30.0–36.0)
MCV: 95.2 fL (ref 80.0–100.0)
Platelets: 285 10*3/uL (ref 150–400)
RBC: 3.76 MIL/uL — ABNORMAL LOW (ref 3.87–5.11)
RDW: 14.9 % (ref 11.5–15.5)
WBC: 6.5 10*3/uL (ref 4.0–10.5)
nRBC: 0 % (ref 0.0–0.2)

## 2019-08-31 LAB — PROTIME-INR
INR: 1.2 (ref 0.8–1.2)
Prothrombin Time: 14.4 seconds (ref 11.4–15.2)

## 2019-08-31 LAB — DIFFERENTIAL
Abs Immature Granulocytes: 0.09 10*3/uL — ABNORMAL HIGH (ref 0.00–0.07)
Basophils Absolute: 0 10*3/uL (ref 0.0–0.1)
Basophils Relative: 1 %
Eosinophils Absolute: 0.2 10*3/uL (ref 0.0–0.5)
Eosinophils Relative: 2 %
Immature Granulocytes: 1 %
Lymphocytes Relative: 38 %
Lymphs Abs: 2.4 10*3/uL (ref 0.7–4.0)
Monocytes Absolute: 0.4 10*3/uL (ref 0.1–1.0)
Monocytes Relative: 6 %
Neutro Abs: 3.3 10*3/uL (ref 1.7–7.7)
Neutrophils Relative %: 52 %

## 2019-08-31 LAB — APTT: aPTT: 31 seconds (ref 24–36)

## 2019-08-31 NOTE — ED Notes (Signed)
Robin Moreno, son, [phone] would like an update when available

## 2019-08-31 NOTE — ED Notes (Signed)
Otila Back [phone]) called for an update/would like to come visit once patient is in room.

## 2019-08-31 NOTE — ED Triage Notes (Addendum)
Pt BIB [facility] for eval of generalized twitching. Pt intermittently twitching throughout all 4 extremities. Pt is A&Ox4, no neuro deficits. Able to converse freely during the twitching, no hx of seizures. LKN w/out twitching was 1500

## 2019-08-31 NOTE — Progress Notes (Signed)
CSW received a call from pt's son Otila Back [phone]) who called for an update/would like to come visit once patient is in room.  CSW stated that CSW could at this point neither confirm/deny but info would be noted.  Pt's son was appreciative and thanked the CSW and stated he would remain near the registration desk to be called by staff with updates.   CSW will continue to follow for D/C needs.  Dorothe Pea. Rhyker Silversmith  MSW, LCSW, LCAS, CCS Transitions of Care Clinical Social Worker Care Coordination Department Ph: [phone]

## 2019-09-01 ENCOUNTER — Other Ambulatory Visit: Payer: Self-pay

## 2019-09-01 ENCOUNTER — Observation Stay (HOSPITAL_COMMUNITY): Payer: Medicare Other

## 2019-09-01 ENCOUNTER — Emergency Department (HOSPITAL_COMMUNITY): Payer: Medicare Other

## 2019-09-01 ENCOUNTER — Encounter (HOSPITAL_COMMUNITY): Payer: Self-pay | Admitting: Internal Medicine

## 2019-09-01 DIAGNOSIS — N1831 Chronic kidney disease, stage 3a: Secondary | ICD-10-CM

## 2019-09-01 DIAGNOSIS — Z66 Do not resuscitate: Secondary | ICD-10-CM | POA: Diagnosis present

## 2019-09-01 DIAGNOSIS — G514 Facial myokymia: Secondary | ICD-10-CM | POA: Diagnosis not present

## 2019-09-01 DIAGNOSIS — R253 Fasciculation: Secondary | ICD-10-CM | POA: Diagnosis not present

## 2019-09-01 HISTORY — DX: Chronic kidney disease, stage 3a: N18.31

## 2019-09-01 LAB — CBG MONITORING, ED: Glucose-Capillary: 96 mg/dL (ref 70–99)

## 2019-09-01 LAB — AMMONIA: Ammonia: 22 umol/L (ref 9–35)

## 2019-09-01 LAB — MAGNESIUM: Magnesium: 2 mg/dL (ref 1.7–2.4)

## 2019-09-01 MED ORDER — HEPARIN SODIUM (PORCINE) 5000 UNIT/ML IJ SOLN
5000.0000 [IU] | Freq: Three times a day (TID) | INTRAMUSCULAR | Status: DC
  Administered 2019-09-01 – 2019-09-02 (×3): 5000 [IU] via SUBCUTANEOUS
  Filled 2019-09-01 (×3): qty 1

## 2019-09-01 MED ORDER — ORAL CARE MOUTH RINSE
15.0000 mL | Freq: Two times a day (BID) | OROMUCOSAL | Status: DC
  Administered 2019-09-02: 15 mL via OROMUCOSAL

## 2019-09-01 MED ORDER — SODIUM CHLORIDE 0.9 % IV SOLN: INTRAVENOUS | Status: DC

## 2019-09-01 MED ORDER — GABAPENTIN 100 MG PO CAPS
200.0000 mg | ORAL_CAPSULE | Freq: Three times a day (TID) | ORAL | Status: DC
  Administered 2019-09-02 (×2): 200 mg via ORAL
  Filled 2019-09-01 (×2): qty 2

## 2019-09-01 MED ORDER — ACETAMINOPHEN 650 MG RE SUPP: 650.0000 mg | RECTAL | Status: DC | PRN

## 2019-09-01 MED ORDER — ACETAMINOPHEN 325 MG PO TABS: 650.0000 mg | ORAL_TABLET | ORAL | Status: DC | PRN

## 2019-09-01 MED ORDER — ENALAPRIL MALEATE 5 MG PO TABS
10.0000 mg | ORAL_TABLET | Freq: Every day | ORAL | Status: DC
  Administered 2019-09-02: 10 mg via ORAL
  Filled 2019-09-01 (×2): qty 2

## 2019-09-01 MED ORDER — POLYVINYL ALCOHOL 1.4 % OP SOLN
1.0000 [drp] | Freq: Every morning | OPHTHALMIC | Status: DC
  Administered 2019-09-02: 1 [drp] via OPHTHALMIC
  Filled 2019-09-01 (×2): qty 15

## 2019-09-01 MED ORDER — CHLORHEXIDINE GLUCONATE 0.12 % MT SOLN
15.0000 mL | Freq: Two times a day (BID) | OROMUCOSAL | Status: DC
  Administered 2019-09-02 (×2): 15 mL via OROMUCOSAL
  Filled 2019-09-01 (×2): qty 15

## 2019-09-01 MED ORDER — SENNOSIDES-DOCUSATE SODIUM 8.6-50 MG PO TABS: 1.0000 | ORAL_TABLET | Freq: Every evening | ORAL | Status: DC | PRN

## 2019-09-01 MED ORDER — ROSUVASTATIN CALCIUM 5 MG PO TABS
10.0000 mg | ORAL_TABLET | Freq: Every day | ORAL | Status: DC
  Administered 2019-09-02: 10 mg via ORAL
  Filled 2019-09-01 (×2): qty 2

## 2019-09-01 MED ORDER — LACTATED RINGERS IV BOLUS
1000.0000 mL | Freq: Once | INTRAVENOUS | Status: AC
  Administered 2019-09-01: 1000 mL via INTRAVENOUS

## 2019-09-01 MED ORDER — STROKE: EARLY STAGES OF RECOVERY BOOK
Freq: Once | Status: DC
  Filled 2019-09-01: qty 1

## 2019-09-01 MED ORDER — DILTIAZEM HCL 30 MG PO TABS
60.0000 mg | ORAL_TABLET | Freq: Two times a day (BID) | ORAL | Status: DC
  Administered 2019-09-02: 60 mg via ORAL
  Filled 2019-09-01: qty 1
  Filled 2019-09-01: qty 2

## 2019-09-01 MED ORDER — ASPIRIN EC 325 MG PO TBEC
325.0000 mg | DELAYED_RELEASE_TABLET | Freq: Every day | ORAL | Status: DC
  Administered 2019-09-02: 325 mg via ORAL
  Filled 2019-09-01: qty 1

## 2019-09-01 MED ORDER — LACTATED RINGERS IV BOLUS: 1000.0000 mL | Freq: Once | INTRAVENOUS | Status: DC

## 2019-09-01 MED ORDER — ACETAMINOPHEN 160 MG/5ML PO SOLN: 650.0000 mg | ORAL | Status: DC | PRN

## 2019-09-01 NOTE — ED Provider Notes (Signed)
[facility] EMERGENCY DEPARTMENT Provider Note   CSN: [id number] Arrival date & time: 08/31/19  1823     History Chief Complaint  Patient presents with  . Twitching    Robin Moreno is a 86 y.o. female.  History of bilateral lower extremity weakness for a few months but woke up from a nap this afternoon and was found to have bilateral upper arm tremors and twitching along with facial twitching as well. She also has speaking. This is new per her son who takes care of her. No other associated symptoms. No recent illnesses. No cough no urinary symptoms. Is been eating and drinking normally. Never anything at this before. No new medications or changes in medicines.   Neurologic Problem This is a new problem. The current episode started 6 to 12 hours ago. The problem occurs constantly. The problem has not changed since onset.Pertinent negatives include no chest pain and no headaches. Nothing aggravates the symptoms. Nothing relieves the symptoms. She has tried nothing for the symptoms. The treatment provided no relief.       Past Medical History:  Diagnosis Date  . Arthritis   . Degenerative disk disease   . Hypercholesterolemia   . Hypertension   . Osteoarthritis of right knee   . Renal disorder     Patient Active Problem List   Diagnosis Date Noted  . COVID-19 virus infection 07/30/2019  . Essential hypertension 07/30/2019  . Dyslipidemia 07/30/2019  . Pre-syncope 07/30/2019  . Viral gastroenteritis due to [place] agent 09/19/2018  . Acute kidney injury superimposed on CKD (HCC) 09/18/2018  . Diarrhea 09/18/2018  . Urinary tract infection 09/18/2018  . Generalized weakness 09/18/2018  . Arthritis 09/18/2018    Past Surgical History:  Procedure Laterality Date  . ABDOMINAL HYSTERECTOMY    . TOTAL HIP ARTHROPLASTY       OB History   No obstetric history on file.     No family history on file.  Social History   Tobacco Use  . Smoking status:  Never Smoker  . Smokeless tobacco: Never Used  Substance Use Topics  . Alcohol use: No  . Drug use: No    Home Medications Prior to Admission medications   Medication Sig Start Date End Date Taking? Authorizing Provider  acetaminophen (TYLENOL) 325 MG tablet Take 2 tablets (650 mg total) by mouth every 6 (six) hours as needed. 01/04/19   Derwood Kaplan, MD  aspirin EC 325 MG tablet Take 1 tablet (325 mg total) by mouth daily. 07/23/18   Pricilla Loveless, MD  Cholecalciferol (VITAMIN D) 2000 UNITS tablet Take 2,000 Units by mouth daily.      [provider]  diclofenac sodium (VOLTAREN) 1 % GEL Apply 4 g topically 4 (four) times daily. Apply to your left shoulder Patient not taking: Reported on 09/18/2018 11/14/17   Shaune Pollack, MD  gabapentin (NEURONTIN) 300 MG capsule Take 300 mg (1 capsule) twice daily the first day and then begin 300 mg (1 capsule) 3 times daily thereafter. Patient taking differently: Take 300 mg by mouth 3 (three) times daily. Take 300 mg (1 capsule) twice daily the first day and then begin 300 mg (1 capsule) 3 times daily thereafter. 09/12/18   Law, Waylan Boga, PA-C  guaiFENesin-dextromethorphan (ROBITUSSIN DM) 100-10 MG/5ML syrup Take 10 mLs by mouth every 4 (four) hours as needed for cough. 07/31/19   Ghimire, Werner Lean, MD  meclizine (ANTIVERT) 12.5 MG tablet Take 1 tablet (12.5 mg total) by mouth  3 (three) times daily as needed for dizziness. 07/23/18   Pricilla Loveless, MD  Menthol-Methyl Salicylate (MUSCLE RUB) 10-15 % CREA Apply 1 application topically as needed (for sore muscle).    [provider]  Multiple Vitamin (MULTIVITAMIN WITH MINERALS) TABS Take 1 tablet by mouth daily.    [provider]  oxyCODONE (ROXICODONE) 5 MG immediate release tablet Take 0.5 tablets (2.5 mg total) by mouth every 6 (six) hours as needed for severe pain. Patient not taking: Reported on 09/18/2018 11/14/17   Shaune Pollack, MD  Polyethyl Glycol-Propyl Glycol  (SYSTANE OP) Place 1 drop into both eyes every morning.    [provider]  rosuvastatin (CRESTOR) 10 MG tablet Take 10 mg by mouth daily.    [provider]    Allergies    Patient has no known allergies.  Review of Systems   Review of Systems  Cardiovascular: Negative for chest pain.  Neurological: Negative for headaches.  All other systems reviewed and are negative.   Physical Exam Updated Vital Signs BP (!) 152/70   Pulse 66   Temp (!) 97 F (36.1 C) (Rectal)   Resp 14   Ht 5' (1.524 m)   Wt 65 kg   SpO2 97%   BMI 27.99 kg/m   Physical Exam Vitals and nursing note reviewed.  Constitutional:      Appearance: She is well-developed.  HENT:     Head: Normocephalic and atraumatic.     Mouth/Throat:     Mouth: Mucous membranes are dry.     Pharynx: Oropharynx is clear.  Eyes:     Conjunctiva/sclera: Conjunctivae normal.  Cardiovascular:     Rate and Rhythm: Normal rate and regular rhythm.  Pulmonary:     Effort: No respiratory distress.     Breath sounds: No stridor.  Abdominal:     General: There is no distension.  Musculoskeletal:     Cervical back: Normal range of motion.  Neurological:     Mental Status: She is alert.     Comments: Bilateral lower extremity weakness. Bilateral upper extremities with irregular abnormal movements along with twitching of her face as well. She does seem to have some weakness in her upper extremities but is able to hold them up against gravity. 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 No obvious facial paralysis.     ED Results / Procedures / Treatments   Labs (all labs ordered are listed, but only abnormal results are displayed) Labs Reviewed  CBC - Abnormal; Notable for the following components:      Result Value   RBC 3.76 (*)    Hemoglobin 10.9 (*)    HCT 35.8 (*)    All other components within normal limits  DIFFERENTIAL -  Abnormal; Notable for the following components:   Abs Immature Granulocytes 0.09 (*)    All other components within normal limits  COMPREHENSIVE METABOLIC PANEL - Abnormal; Notable for the following components:   Glucose, Bld 220 (*)    Creatinine, Ser 1.15 (*)    Albumin 3.0 (*)    GFR calc non Af Amer 41 (*)    GFR calc Af Amer 48 (*)    All other components within normal limits  I-STAT CHEM 8, ED - Abnormal; Notable for the following components:   Creatinine, Ser 1.10 (*)    Glucose, Bld 220 (*)    Hemoglobin 11.2 (*)  HCT 33.0 (*)    All other components within normal limits  URINE CULTURE  PROTIME-INR  APTT  AMMONIA  MAGNESIUM  URINALYSIS, ROUTINE W REFLEX MICROSCOPIC  CBG MONITORING, ED  CBG MONITORING, ED    EKG EKG Interpretation  Date/Time:  Friday August 31 2019 18:32:48 EDT Ventricular Rate:  95 PR Interval:    QRS Duration: 100 QT Interval:  362 QTC Calculation: 454 R Axis:   16 Text Interpretation: Undetermined rhythm Left ventricular hypertrophy with repolarization abnormality ( R in aVL ) Abnormal ECG LVH w/ repol new since may Confirmed by Merrily Pew [phone]) on 09/01/2019 3:58:18 AM   Radiology CT HEAD WO CONTRAST  Result Date: 08/31/2019 CLINICAL DATA:  Generalized twitching of all 4 extremities EXAM: CT HEAD WITHOUT CONTRAST TECHNIQUE: Contiguous axial images were obtained from the base of the skull through the vertex without intravenous contrast. COMPARISON:  01/04/2019 FINDINGS: Brain: There are chronic small vessel ischemic changes scattered throughout the periventricular white matter and bilateral basal ganglia. Stable chronic left parietal cortical infarct. No signs of acute infarct or hemorrhage. Lateral ventricles and remaining midline structures are unremarkable. No acute extra-axial fluid collections. No mass effect. Vascular: No hyperdense vessel or unexpected calcification. Skull: Normal. Negative for fracture or focal lesion. Sinuses/Orbits: No  acute finding. Other: None. IMPRESSION: 1. Stable chronic ischemic changes.  No acute intracranial process. Electronically Signed   By: Randa Ngo M.D.   On: 08/31/2019 21:40   DG Chest Portable 1 View  Result Date: 09/01/2019 CLINICAL DATA:  Shakiness. EXAM: PORTABLE CHEST 1 VIEW COMPARISON:  07/30/2019 FINDINGS: Cardiac silhouette is normal in size. No mediastinal or hilar masses. No evidence of adenopathy. Clear lungs.  No pleural effusion or pneumothorax. Skeletal structures are demineralized but grossly intact. IMPRESSION: No active disease. Electronically Signed   By: Lajean Manes M.D.   On: 09/01/2019 06:13    Procedures Procedures (including critical care time)  Medications Ordered in ED Medications  lactated ringers bolus 1,000 mL (has no administration in time range)  lactated ringers bolus 1,000 mL (1,000 mLs Intravenous New Bag/Given 09/01/19 0630)    ED Course  I have reviewed the triage vital signs and the nursing notes.  Pertinent labs & imaging results that were available during my care of the patient were reviewed by me and considered in my medical decision making (see chart for details).    MDM Rules/Calculators/A&P                      In addition I thought that the patient's symptoms were due to be a metabolic cause or infectious but her work-up was essentially negative still pending urinalysis. I discussed with neurologist, Dr. Cheral Marker, on the phone who recommended admitting her to the hospital to get an MRI, reducing her Neurontin and stopping the Robitussin and otherwise monitoring with fluids. If it did not resolve with those things and MRI was negative then consult neurology for more assistance.  Care transferred pending medicine consultation for admission.  Final Clinical Impression(s) / ED Diagnoses Final diagnoses:  None    Rx / DC Orders ED Discharge Orders    None       Makailey Hodgkin, Corene Cornea, MD 09/01/19 [phone]

## 2019-09-01 NOTE — ED Provider Notes (Addendum)
  Physical Exam  BP (!) 152/70   Pulse 66   Temp (!) 97 F (36.1 C) (Rectal)   Resp 14   Ht 5' (1.524 m)   Wt 65 kg   SpO2 97%   BMI 27.99 kg/m   Physical Exam  ED Course/Procedures     Procedures  MDM  Received patient in signout.  Twitching.  Some difficulty speaking with it which is not her baseline.  Head CT reassuring.  Last normal yesterday.  Discussed with neurology.  Potentially could be medication related.  Recommended stopping Neurontin and the cough medicine.  Also recommended MRI.  They can be consulted if needed further.  Will discuss with unassigned medicine.  Discussed with internal medicine teaching service.  States that patient is assigned patient from [facility].  States Dr. Ludwig Clarks is on the [facility] as assigned.      Benjiman Core, MD 09/01/19 1173    Benjiman Core, MD 09/01/19 [phone]

## 2019-09-01 NOTE — Plan of Care (Signed)

## 2019-09-01 NOTE — ED Notes (Signed)
Tele Dinner ordered 

## 2019-09-01 NOTE — Evaluation (Signed)
Physical Therapy Evaluation Patient Details Name: Robin Moreno MRN: [id number] DOB: 03-13-1926 Today's Date: 09/01/2019   History of Present Illness  86 y.o.female with PMH significant for arhtritis, HTN, renal disorder. Pt also with history of bilateral lower extremity weakness for a few months but woke up from a nap this afternoon and was found to have bilateral upper arm tremors and twitching along with facial twitching as well. MRI negative for acute abnormality.  Clinical Impression  Pt presents to PT with deficits in ROM, strenght, power, functional mobility, balance, endurance, and cognition. Much of ROM and strength deficits appear to be chronic based on chart review from this admission and prior admissions, pt reporting she has not ambulated in some time. Pt does require physical assistance to perform all bed mobility and to maintain a sitting position at the edge of bed, which the pt reports she is able to do by herself typically. Pt also demonstrate intention tremors of BUE with LUE more significantly, as well as resting tremors in facial muscles. Pt will benefit from acute PT services to improve bed mobility quality and aide in progressing to transfer attempts out of bed to improve mobility and quality of life. PT recommends SNF placement at this time due to increased assistance requirements with bed mobility. If the pt and family elect to D/C home then PT recommends continue HHPT as well as a 3in1 commode and mechanical lift.    Follow Up Recommendations SNF(if family elects to D/C home pt will benefit from HHPT)    Equipment Recommendations  3in1 (PT);Other (comment)(mechanical lift if D/C home)    Recommendations for Other Services       Precautions / Restrictions Precautions Precautions: Fall Restrictions Weight Bearing Restrictions: No      Mobility  Bed Mobility Overal bed mobility: Needs Assistance Bed Mobility: Supine to Sit;Sit to Supine     Supine to sit: Mod  assist Sit to supine: Max assist   General bed mobility comments: pt requires assistance to elevate trunk and to rotate hips at edge of bed  Transfers                    Ambulation/Gait                Stairs            Wheelchair Mobility    Modified Rankin (Stroke Patients Only)       Balance Overall balance assessment: Needs assistance Sitting-balance support: Bilateral upper extremity supported;Feet unsupported Sitting balance-Leahy Scale: Poor Sitting balance - Comments: minA-modA with BUE due to posterior lean Postural control: Posterior lean                                   Pertinent Vitals/Pain Pain Assessment: No/denies pain    Home Living Family/patient expects to be discharged to:: Private residence Living Arrangements: Children Available Help at Discharge: Family;Available 24 hours/day Type of Home: House Home Access: Ramped entrance     Home Layout: One level Home Equipment: Walker - standard;Wheelchair - manual Additional Comments: history obtained from previous admission as pt's cognition appears to be altered    Prior Function Level of Independence: Needs assistance   Gait / Transfers Assistance Needed: pt reports not walking or standing in months. Pt reports she does sit at the edge of the bed for 10-15 minutes at a time without assistance.  ADL's / [employer]  Needed: pt requires assistance with bathing and dressing, she does report being able to feed herself when laying in bed        Hand Dominance   Dominant Hand: Right    Extremity/Trunk Assessment   Upper Extremity Assessment Upper Extremity Assessment: Generalized weakness(ROM WFL)    Lower Extremity Assessment Lower Extremity Assessment: RLE deficits/detail;LLE deficits/detail RLE Deficits / Details: pt with knee flexion limited to ~60 degrees AROM/PROM and -30 degrees DF ROM, otherwise ROM WFL. Generalized weakness LLE Deficits /  Details: pt with knee flexion limited to ~60 degrees AROM/PROM and -30 degrees DF ROM, otherwise ROM WFL. Generalized weakness    Cervical / Trunk Assessment Cervical / Trunk Assessment: Kyphotic  Communication   Communication: HOH  Cognition Arousal/Alertness: Awake/alert Behavior During Therapy: WFL for tasks assessed/performed Overall Cognitive Status: No family/caregiver present to determine baseline cognitive functioning Area of Impairment: Orientation;Attention;Memory;Following commands;Safety/judgement;Awareness;Problem solving                 Orientation Level: Disoriented to;Time(reports year as 33, then 2091, then 2027) Current Attention Level: Sustained Memory: Decreased recall of precautions;Decreased short-term memory Following Commands: Follows one step commands consistently Safety/Judgement: Decreased awareness of safety;Decreased awareness of deficits Awareness: Intellectual Problem Solving: Requires verbal cues;Slow processing        General Comments General comments (skin integrity, edema, etc.): VSS on RA, pt does have intention tremors noted in BUE with LUE>RUE, also resting tremors in facial musculature with most significant area around mouth    Exercises     Assessment/Plan    PT Assessment Patient needs continued PT services  PT Problem List Decreased strength;Decreased activity tolerance;Decreased balance;Decreased range of motion;Decreased mobility;Decreased coordination;Decreased cognition;Decreased knowledge of use of DME;Decreased safety awareness       PT Treatment Interventions DME instruction;Functional mobility training;Therapeutic activities;Therapeutic exercise;Balance training;Neuromuscular re-education;Cognitive remediation;Patient/family education;Wheelchair mobility training    PT Goals (Current goals can be found in the Care Plan section)  Acute Rehab PT Goals Patient Stated Goal: To improve mobility and to stand again PT Goal  Formulation: With patient Time For Goal Achievement: 09/15/19 Potential to Achieve Goals: Poor    Frequency Min 2X/week   Barriers to discharge        Co-evaluation               AM-PAC PT "6 Clicks" Mobility  Outcome Measure Help needed turning from your back to your side while in a flat bed without using bedrails?: A Lot Help needed moving from lying on your back to sitting on the side of a flat bed without using bedrails?: A Lot Help needed moving to and from a bed to a chair (including a wheelchair)?: Total Help needed standing up from a chair using your arms (e.g., wheelchair or bedside chair)?: Total Help needed to walk in hospital room?: Total Help needed climbing 3-5 steps with a railing? : Total 6 Click Score: 8    End of Session   Activity Tolerance: Patient tolerated treatment well Patient left: in bed;with call bell/phone within reach Nurse Communication: Mobility status PT Visit Diagnosis: Muscle weakness (generalized) (M62.81);Other abnormalities of gait and mobility (R26.89);Other symptoms and signs involving the nervous system (R29.898)    Time: 3614-4315 PT Time Calculation (min) (ACUTE ONLY): 26 min   Charges:   PT Evaluation $PT Eval Moderate Complexity: 1 Mod          Zenaida Niece, PT, DPT Acute Rehabilitation Pager: [phone]   Zenaida Niece 09/01/2019, 5:46 PM

## 2019-09-01 NOTE — H&P (Signed)
History and Physical    Robin Moreno GOT:[id number] DOB: 1926-02-25 DOA: 08/31/2019  PCP: Ralene Ok, MD Consultants:  Pearlean Brownie - neurology Patient coming from:  Home - lives with son; NOK: Alisea, Matte, [phone]  Chief Complaint: Twitching  HPI: Robin Moreno is a 86 y.o. female with medical history significant of HTN and HLD presenting with twitching.  Her son woke up to give her a bath and noticed her lips and face "jumping and jerking up and down."  She had difficulty talking.  He gave her some melatonin for sleep based on recommendation from house call doctor.  He woke her up out of sleep and she was confused recently.  She seemed weak recently, difficulty lifting her fork on the left side a few days ago.  She has trouble moving her right side and hearing out of her right ear at baseline.  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    She was also admitted from 5/3-4 for presyncope and COVID-19 infection.    ED Course:  Active at baseline.  Brought in for "twitching" of all extremities and slurred speech, aphasia - atypical.  Talked to Dr. Otelia Limes, recommended MRI.  Taper meds, stop neurontin, consult if needed.    Review of Systems: Unable to perform   Ambulatory Status:  Generally bed bound  COVID Vaccine Status:  Complete (after infection on 5/3)  Past Medical History:  Diagnosis Date  . Arthritis   . Degenerative disk disease   . Hypercholesterolemia   . Hypertension   . Osteoarthritis of right knee   . Renal disorder   . Stage 3a chronic kidney disease 09/01/2019    Past Surgical History:  Procedure Laterality Date  . ABDOMINAL HYSTERECTOMY    . TOTAL HIP ARTHROPLASTY      Social History   Socioeconomic History  . Marital status: Widowed    Spouse name: Not on file  . Number of children: Not on file  . Years of education: Not on file  . Highest education level: Not on file  Occupational History  . Not on file  Tobacco Use  . Smoking status: Never  Smoker  . Smokeless tobacco: Never Used  Substance and Sexual Activity  . Alcohol use: No  . Drug use: No  . Sexual activity: Not on file  Other Topics Concern  . Not on file  Social History Narrative  . Not on file   Social Determinants of Health   Financial Resource Strain:   . Difficulty of Paying Living Expenses:   Food Insecurity:   . Worried About Programme researcher, broadcasting/film/video in the Last Year:   . Barista in the Last Year:   Transportation Needs:   . Freight forwarder (Medical):   Marland Kitchen Lack of Transportation (Non-Medical):   Physical Activity:   . Days of Exercise per Week:   . Minutes of Exercise per Session:   Stress:   . Feeling of Stress :   Social Connections:   . Frequency of Communication with Friends and Family:   . Frequency of Social Gatherings with Friends and Family:   . Attends Religious Services:   . Active Member of Clubs or Organizations:   . Attends Banker Meetings:   Marland Kitchen Marital Status:   Intimate Partner Violence:   . Fear of Current or Ex-Partner:   . Emotionally Abused:   Marland Kitchen Physically Abused:   . Sexually Abused:     No Known Allergies  No family history on file.  Prior to Admission medications   Medication Sig Start Date End Date Taking? Authorizing Provider  acetaminophen (TYLENOL) 325 MG tablet Take 2 tablets (650 mg total) by mouth every 6 (six) hours as needed. 01/04/19   Derwood Kaplan, MD  aspirin EC 325 MG tablet Take 1 tablet (325 mg total) by mouth daily. 07/23/18   Pricilla Loveless, MD  Cholecalciferol (VITAMIN D) 2000 UNITS tablet Take 2,000 Units by mouth daily.      [provider]  diclofenac sodium (VOLTAREN) 1 % GEL Apply 4 g topically 4 (four) times daily. Apply to your left shoulder Patient not taking: Reported on 09/18/2018 11/14/17   Shaune Pollack, MD  gabapentin (NEURONTIN) 300 MG capsule Take 300 mg (1 capsule) twice daily the first day and then begin 300 mg (1 capsule) 3 times daily  thereafter. Patient taking differently: Take 300 mg by mouth 3 (three) times daily. Take 300 mg (1 capsule) twice daily the first day and then begin 300 mg (1 capsule) 3 times daily thereafter. 09/12/18   Law, Waylan Boga, PA-C  guaiFENesin-dextromethorphan (ROBITUSSIN DM) 100-10 MG/5ML syrup Take 10 mLs by mouth every 4 (four) hours as needed for cough. 07/31/19   Ghimire, Werner Lean, MD  meclizine (ANTIVERT) 12.5 MG tablet Take 1 tablet (12.5 mg total) by mouth 3 (three) times daily as needed for dizziness. 07/23/18   Pricilla Loveless, MD  Menthol-Methyl Salicylate (MUSCLE RUB) 10-15 % CREA Apply 1 application topically as needed (for sore muscle).    [provider]  Multiple Vitamin (MULTIVITAMIN WITH MINERALS) TABS Take 1 tablet by mouth daily.    [provider]  oxyCODONE (ROXICODONE) 5 MG immediate release tablet Take 0.5 tablets (2.5 mg total) by mouth every 6 (six) hours as needed for severe pain. Patient not taking: Reported on 09/18/2018 11/14/17   Shaune Pollack, MD  Polyethyl Glycol-Propyl Glycol (SYSTANE OP) Place 1 drop into both eyes every morning.    [provider]  rosuvastatin (CRESTOR) 10 MG tablet Take 10 mg by mouth daily.    [provider]    Physical Exam: Vitals:   09/01/19 1115 09/01/19 1130 09/01/19 1344 09/01/19 1448  BP: (!) 145/90 (!) 142/71 (!) 145/67 (!) 158/78  Pulse: 67 66 72 74  Resp: 20 17 (!) 30 20  Temp:    97.6 F (36.4 C)  TempSrc:      SpO2: 97% 98% 99% 98%  Weight:      Height:         . General:  Appears calm and comfortable and is NAD, somewhat somnolent, no twitching . Eyes:  PERRL, EOMI, normal lids, iris . ENT:  grossly normal hearing, lips & tongue, mmm . Neck:  no LAD, masses or thyromegaly . Cardiovascular:  RRR, no m/r/g. No LE edema.  Marland Kitchen Respiratory:   CTA bilaterally with no wheezes/rales/rhonchi.  Normal respiratory effort. . Abdomen:  soft, NT, ND, NABS . Skin:  no rash or induration seen on  limited exam . Musculoskeletal:  grossly normal tone BUE/BLE, good ROM, no bony abnormality . Psychiatric:  somnolent mood and affect, speech sparse . Neurologic:  Unable to perform    Radiological Exams on Admission: CT HEAD WO CONTRAST  Result Date: 08/31/2019 CLINICAL DATA:  Generalized twitching of all 4 extremities EXAM: CT HEAD WITHOUT CONTRAST TECHNIQUE: Contiguous axial images were obtained from the base of the skull through the vertex without intravenous contrast. COMPARISON:  01/04/2019 FINDINGS: Brain: There are  chronic small vessel ischemic changes scattered throughout the periventricular white matter and bilateral basal ganglia. Stable chronic left parietal cortical infarct. No signs of acute infarct or hemorrhage. Lateral ventricles and remaining midline structures are unremarkable. No acute extra-axial fluid collections. No mass effect. Vascular: No hyperdense vessel or unexpected calcification. Skull: Normal. Negative for fracture or focal lesion. Sinuses/Orbits: No acute finding. Other: None. IMPRESSION: 1. Stable chronic ischemic changes.  No acute intracranial process. Electronically Signed   By: Randa Ngo M.D.   On: 08/31/2019 21:40   MR BRAIN WO CONTRAST  Result Date: 09/01/2019 CLINICAL DATA:  Twitching of the extremities. EXAM: MRI HEAD WITHOUT CONTRAST TECHNIQUE: Multiplanar, multiecho pulse sequences of the brain and surrounding structures were obtained without intravenous contrast. COMPARISON:  Head CT 08/31/2019 FINDINGS: Brain: Diffusion imaging does not show any acute or subacute infarction. There are chronic small-vessel ischemic changes affecting the pons. No focal cerebellar insult. Cerebral hemispheres show chronic small-vessel ischemic changes throughout the white matter. There is an old left parietal cortical and subcortical infarction. No evidence of mass lesion, recent hemorrhage, hydrocephalus or extra-axial collection. Some hemosiderin deposition is present at  the site of the old left parietal stroke. Vascular: Major vessels at the base of the brain show flow. Skull and upper cervical spine: Negative Sinuses/Orbits: Clear/normal Other: None IMPRESSION: No acute or reversible finding. Chronic small-vessel ischemic changes affecting the pons and cerebral hemispheric white matter. Old left parietal infarction with some hemosiderin deposition. Electronically Signed   By: Nelson Chimes M.D.   On: 09/01/2019 12:50   DG Chest Portable 1 View  Result Date: 09/01/2019 CLINICAL DATA:  Shakiness. EXAM: PORTABLE CHEST 1 VIEW COMPARISON:  07/30/2019 FINDINGS: Cardiac silhouette is normal in size. No mediastinal or hilar masses. No evidence of adenopathy. Clear lungs.  No pleural effusion or pneumothorax. Skeletal structures are demineralized but grossly intact. IMPRESSION: No active disease. Electronically Signed   By: Lajean Manes M.D.   On: 09/01/2019 06:13    EKG: Independently reviewed.  NSR with rate 95; LVH with new repolarization abnormality  Labs on Admission: I have personally reviewed the available labs and imaging studies at the time of the admission.  Pertinent labs:   Glucose 220 BUN 18/Creatinine 1.15/GFR 48 - stable Albumin 3.0 NH4 22 WBC 6.5 Hgb 10.9 INR 1.2 COVID POSITIVE on 5/3   Assessment/Plan Principal Problem:   Facial twitching Active Problems:   COVID-19 virus infection   Essential hypertension   Dyslipidemia   Stage 3a chronic kidney disease   DNR (do not resuscitate)    Facial twitching -Patient was noted at home to have facial twitching and so was sent in for evaluation -No twitching noted at the time of my evaluation -Patient d/w neurology by the EDP with recommendation to stop Robitussin, taper Neurontin, and obtain MRI -MRI without acute findings -Will monitor overnight on telemetry with plan to d/c without further evaluation tomorrow if she has no further episodes -Given full dose ASA for now -Of note, she did have  tramadol listed on her home medication list and this medication can lower the seizure threshold; will hold at this time  HTN -Continue Cardizem and enalapril -Hold HCTZ  HLD -She does not appear to be taking medications for this issue at this time  Stage 3a CKD -Appears to be stable at this time -Recheck in AM  H/o COVID-19 infection -Patient with no current symptoms associated with recent infection -Her test was positive on 5/3 -Since COVID patients can have  a positive test for up to 90 days post-infection without being infectious, there does not appear to be an indication for repeat testing at this time    DVT prophylaxis:   Lovenox  Code Status:  DNR - confirmed with family Family Communication: None present; I spoke with the patient's son by telephone at the time of admission. Disposition Plan:  The patient is from: home  Anticipated d/c is to: home without [facility] services   Anticipated d/c date will depend on clinical response to treatment, but possibly as early as tomorrow if she has excellent response to treatment  Patient is currently: acutely ill Consults called: None Admission status:  It is my clinical opinion that referral for OBSERVATION is reasonable and necessary in this patient based on the above information provided. The aforementioned taken together are felt to place the patient at high risk for further clinical deterioration. However it is anticipated that the patient may be medically stable for discharge from the hospital within 24 to 48 hours.    Jonah Blue MD [facility]   How to contact the [facility] Attending or Consulting provider 7A - 7P or covering provider during after hours 7P -7A, for this patient?  1. Check the care team in [facility] and look for a) attending/consulting TR[facility] provider listed and b) the [facility] team listed 2. Log into [email or web address] and use [facility]'s universal password to access. If you do not have the password, please contact the hospital  operator. 3. Locate the [facility] provider you are looking for under [facility] and page to a number that you can be directly reached. 4. If you still have difficulty reaching the provider, please page the [facility] (Director on Call) for the Hospitalists listed on amion for assistance.   09/01/2019, 5:19 PM

## 2019-09-02 ENCOUNTER — Encounter (HOSPITAL_COMMUNITY): Payer: Self-pay | Admitting: Internal Medicine

## 2019-09-02 DIAGNOSIS — G514 Facial myokymia: Secondary | ICD-10-CM | POA: Diagnosis not present

## 2019-09-02 DIAGNOSIS — R253 Fasciculation: Secondary | ICD-10-CM | POA: Diagnosis not present

## 2019-09-02 MED ORDER — ENSURE ENLIVE PO LIQD
237.0000 mL | Freq: Two times a day (BID) | ORAL | Status: DC
  Administered 2019-09-02: 237 mL via ORAL

## 2019-09-02 MED ORDER — GUAIFENESIN-DM 100-10 MG/5ML PO SYRP: 5.0000 mL | ORAL_SOLUTION | ORAL | 0 refills | Status: DC | PRN

## 2019-09-02 MED ORDER — ADULT MULTIVITAMIN W/MINERALS CH: 1.0000 | ORAL_TABLET | Freq: Every day | ORAL | Status: DC

## 2019-09-02 MED ORDER — GABAPENTIN 100 MG PO CAPS: 100.0000 mg | ORAL_CAPSULE | Freq: Three times a day (TID) | ORAL | 0 refills | Status: AC

## 2019-09-02 NOTE — Progress Notes (Signed)
Initial Nutrition Assessment  DOCUMENTATION CODES:   Not applicable  INTERVENTION:   Ensure Enlive po BID, each supplement provides 350 kcal and 20 grams of protein  MVI daily   NUTRITION DIAGNOSIS:   Increased nutrient needs related to catabolic illness(recent COVID 19 infection) as evidenced by increased estimated needs.  GOAL:   Patient will meet greater than or equal to 90% of their needs  MONITOR:   PO intake, Supplement acceptance, Labs, Weight trends, Skin, I & O's  REASON FOR ASSESSMENT:   Consult Assessment of nutrition requirement/status  ASSESSMENT:   86 y.o. female with medical history significant of CKD III, HTN, recent COVID 19 infection and HLD presenting with twitching.  RD working remotely.  Unable to reach pt by phone. Per chart review, pt was eating normally prior to admit. Pt seen by SLP today and placed on a mechanical soft diet. RD will add supplements and MVI to help pt meet her estimated needs. Per chart, pt appears fairly weight stable pta.    Medications reviewed and include: aspirin, heparin, NaCl @50ml /hr  Labs reviewed: K 3.6 wnl, Mg 2.0 wnl  NUTRITION - FOCUSED PHYSICAL EXAM: Unable to complete at this time   Diet Order:   Diet Order            DIET DYS 3 Room service appropriate? Yes with Assist; Fluid consistency: Thin  Diet effective now             EDUCATION NEEDS:   Education needs have been addressed  Skin:  Skin Assessment: Reviewed RN Assessment  Last BM:  pta  Height:   Ht Readings from Last 1 Encounters:  09/01/19 5' (1.524 m)    Weight:   Wt Readings from Last 1 Encounters:  09/01/19 70 kg    Ideal Body Weight:  45.4 kg  BMI:  Body mass index is 30.14 kg/m.  Estimated Nutritional Needs:   Kcal:  1400-1600kcal/day  Protein:  70-80g/day  Fluid:  1.2-1.4L/day  11/01/19 MS, RD, LDN Please refer to [facility] for RD and/or RD on-call/weekend/after hours pager

## 2019-09-02 NOTE — Care Management Obs Status (Signed)
MEDICARE OBSERVATION STATUS NOTIFICATION   Patient Details  Name: Robin Moreno MRN: [id number] Date of Birth: 05/25/1925   Medicare Observation Status Notification Given:  Yes  Over the phone verbal consent given that patient received notice of observation  Lockie Pares, RN 09/02/2019, 10:17 AM

## 2019-09-02 NOTE — Care Management (Addendum)
13:30 Attempt to call son 3 times, no answer, no ability to leave voicemail. Called patient's room, spoke w patient, son not in room. Patient states that she has home PT, could not remember name of agency.  Sent text to son's number requesting callback from [facility] ([facility]  Need to verify/ offer [facility] services. Verify home address, and verify someone available to accept patient at home from [facility].   Rosenow,Daryle Son [phone]   14:00 Called son, got through, he states that patient has services through [facility], he verified address, and stated that he will be home all day waiting for patient to come home via [facility].  [facility] notified

## 2019-09-02 NOTE — Evaluation (Signed)
Occupational Therapy Evaluation Patient Details Name: Robin Moreno MRN: [id number] DOB: 1925-12-06 Today's Date: 09/02/2019    History of Present Illness 86 y.o.female with PMH significant for arhtritis, HTN, renal disorder. Pt also with history of bilateral lower extremity weakness for a few months but woke up from a nap this afternoon and was found to have bilateral upper arm tremors and twitching along with facial twitching as well. MRI negative for acute abnormality.   Clinical Impression   Pt PTA living with son and requiring assist with most ADL and has been mostly bed bound/w/c bound for some time. Pt currently admitted with above dx. Pt currently with functional limitiations due to the deficits in  decreased ability to care for self safely, decreased activity tolerance and decreased mobility. Pt follows commands, but with cognitive deficits throughout functional tasks. Pt maxA overall for ADL and bed mobility. Pt with intention tremors present. Pt will benefit from skilled OT to increase their independence and safety with adls and balance to allow discharge to SNF. OT following acutely.     Follow Up Recommendations  SNF;Supervision/Assistance - 24 hour(no family in room- pt could be close to baseline to go home)    Equipment Recommendations  3 in 1 bedside commode;Other (comment)(defer to next facility)    Recommendations for Other Services       Precautions / Restrictions Precautions Precautions: Fall Restrictions Weight Bearing Restrictions: No      Mobility Bed Mobility Overal bed mobility: Needs Assistance Bed Mobility: Supine to Sit;Sit to Supine     Supine to sit: Mod assist Sit to supine: Max assist   General bed mobility comments: Pt requiring cues for sequencing and to elevate trunk as she moves her BLEs  Transfers                 General transfer comment: deferred    Balance Overall balance assessment: Needs assistance Sitting-balance support:  Bilateral upper extremity supported;Feet unsupported Sitting balance-Leahy Scale: Poor Sitting balance - Comments: supervisionA to minA due to posterior lean Postural control: Posterior lean                                 ADL either performed or assessed with clinical judgement   ADL Overall ADL's : Needs assistance/impaired Eating/Feeding: Set up;Sitting   Grooming: Minimal assistance;Sitting   Upper Body Bathing: Minimal assistance;Sitting;Bed level   Lower Body Bathing: Maximal assistance;Sitting/lateral leans;Bed level   Upper Body Dressing : Minimal assistance;Sitting;Bed level   Lower Body Dressing: Maximal assistance;Sitting/lateral leans;Bed level   Toilet Transfer: Total assistance Toilet Transfer Details (indicate cue type and reason): has not been OOB in >1 month Toileting- Clothing Manipulation and Hygiene: Maximal assistance       Functional mobility during ADLs: Maximal assistance General ADL Comments: Pt with deficits in decreased ability to care for self safely, decreased activity tolerance and decreased mobility.      Vision Baseline Vision/History: No visual deficits Patient Visual Report: No change from baseline Vision Assessment?: Yes Eye Alignment: Within Functional Limits Ocular Range of Motion: Within Functional Limits Alignment/Gaze Preference: Within Defined Limits Tracking/Visual Pursuits: Able to track stimulus in all quads without difficulty     Perception     Praxis      Pertinent Vitals/Pain Pain Assessment: Faces Faces Pain Scale: Hurts a little bit Pain Location: knees Pain Descriptors / Indicators: Discomfort Pain Intervention(s): Monitored during session     Hand  Dominance Right   Extremity/Trunk Assessment Upper Extremity Assessment Upper Extremity Assessment: Generalized weakness;RUE deficits/detail;LUE deficits/detail RUE Deficits / Details: slight intention tremors noted LUE Deficits / Details: slight  intention tremors noted   Lower Extremity Assessment Lower Extremity Assessment: Defer to PT evaluation   Cervical / Trunk Assessment Cervical / Trunk Assessment: Kyphotic   Communication Communication Communication: HOH   Cognition Arousal/Alertness: Awake/alert Behavior During Therapy: WFL for tasks assessed/performed Overall Cognitive Status: No family/caregiver present to determine baseline cognitive functioning Area of Impairment: Orientation;Attention;Memory;Following commands;Safety/judgement;Awareness;Problem solving                 Orientation Level: Disoriented to;Time(reports year as 1991, then 200) Current Attention Level: Sustained Memory: Decreased short-term memory Following Commands: Follows one step commands consistently Safety/Judgement: Decreased awareness of safety;Decreased awareness of deficits Awareness: Intellectual Problem Solving: Requires verbal cues;Slow processing General Comments: Pt requires frequent cueing to address current task; pt easily distracted   General Comments  VSS onRA    Exercises     Shoulder Instructions      Home Living Family/patient expects to be discharged to:: Private residence Living Arrangements: Children Available Help at Discharge: Family;Available 24 hours/day Type of Home: House Home Access: Ramped entrance     Home Layout: One level     Bathroom Shower/Tub: Chief Strategy Officer: Handicapped height Bathroom Accessibility: No   Home Equipment: Walker - standard;Wheelchair - manual   Additional Comments: history obtained from previous admission as pt's cognition appears to be altered      Prior Functioning/Environment Level of Independence: Needs assistance  Gait / Transfers Assistance Needed: pt reports not walking or standing in months. Pt reports she does sit at the edge of the bed for 10-15 minutes at a time without assistance. ADL's / Homemaking Assistance Needed: pt requires  assistance with bathing and dressing, she does report being able to feed herself when laying in bed Communication / Swallowing Assistance Needed: hard of hearing          OT Problem List: Decreased strength;Decreased activity tolerance;Impaired balance (sitting and/or standing);Impaired vision/perception;Decreased coordination;Decreased cognition;Decreased safety awareness;Decreased knowledge of use of DME or AE;Impaired UE functional use;Pain;Increased edema      OT Treatment/Interventions: Self-care/ADL training;Therapeutic exercise;Energy conservation;DME and/or AE instruction;Therapeutic activities;Patient/family education;Balance training;Cognitive remediation/compensation    OT Goals(Current goals can be found in the care plan section) Acute Rehab OT Goals Patient Stated Goal: To improve mobility and to stand again OT Goal Formulation: With patient Time For Goal Achievement: 09/16/19 Potential to Achieve Goals: Good ADL Goals Pt Will Perform Grooming: sitting;with modified independence Pt/caregiver will Perform Home Exercise Program: Increased strength;Both right and left upper extremity;With Supervision Additional ADL Goal #1: Pt will perform bed mobility with minA overall as precursor for EOB ADL.  OT Frequency: Min 2X/week   Barriers to D/C:            Co-evaluation              AM-PAC OT "6 Clicks" Daily Activity     Outcome Measure Help from another person eating meals?: A Little Help from another person taking care of personal grooming?: A Little Help from another person toileting, which includes using toliet, bedpan, or urinal?: Total Help from another person bathing (including washing, rinsing, drying)?: A Lot Help from another person to put on and taking off regular upper body clothing?: A Little Help from another person to put on and taking off regular lower body clothing?: A Lot 6 Click Score:  14   End of Session Nurse Communication: Mobility  status  Activity Tolerance: Patient tolerated treatment well Patient left: in bed;with call bell/phone within reach;with bed alarm set  OT Visit Diagnosis: Unsteadiness on feet (R26.81);Muscle weakness (generalized) (M62.81);Other symptoms and signs involving cognitive function                Time: 4045-9136 OT Time Calculation (min): 20 min Charges:  OT General Charges $OT Visit: 1 Visit OT Evaluation $OT Eval Moderate Complexity: 1 Mod  Flora Lipps, OTR/L Acute Rehabilitation Services Pager: [phone] Office: [phone]   Delmar Dondero C 09/02/2019, 4:57 PM

## 2019-09-02 NOTE — TOC Initial Note (Signed)
Transition of Care [facility]) - Initial/Assessment Note    Patient Details  Name: Robin Moreno MRN: [id number] Date of Birth: 06/02/1925  [facility]) CM/SW Contact:    Jacquelynn Cree Phone Number: 09/02/2019, 11:41 AM  Clinical Narrative:                 CSW met with patient to discuss PT recommendation for discharge. Patient provided CSW permission to speak with her son Daryle.   CSW contacted patient's son to discuss PT recommendation of short-term SNF. Son lives with patient and helps care for her. Son expressed that patient has been to a SNF before and considering she does not ambulate at baseline, he would like her to return home. Son stated patient has someone that comes to the home to provide PT, but he was unable to provide the agency. He stated he was told they will only be coming out a couple of more times. Son is interested in ongoing [facility] at discharge. Patient does not have any DME in the home. Son stated patient uses [facility], due to being unable to make it to a commode.   No further questions expressed at this time, [facility] team will continue to follow and assist with discharge planning needs.  Expected Discharge Plan: [place] Barriers to Discharge: Continued Medical Work up   Patient Goals and CMS Choice   CMS Medicare.gov Compare Post Acute Care list provided to:: Patient Represenative (must comment)(Daryle)    Expected Discharge Plan and Services Expected Discharge Plan: [place] In-house Referral: Clinical Social Work     Living arrangements for the past 2 months: [place] [facility]                                      Prior Living Arrangements/Services Living arrangements for the past 2 months: Single Family Home Lives with:: Adult Children Patient language and need for interpreter reviewed:: Yes Do you feel safe going back to the place where you live?: Yes      Need for Family Participation  in Patient Care: Yes (Comment) Care giver support system in place?: Yes (comment)   Criminal Activity/Legal Involvement Pertinent to Current Situation/Hospitalization: No - Comment as needed  Activities of Daily Living      Permission Sought/Granted Permission sought to share information with : Family Supports Permission granted to share information with : Yes, Verbal Permission Granted  Share Information with NAME: Daryle Shanley     Permission granted to share info w Relationship: Son  Permission granted to share info w Contact Information: [phone]  Emotional Assessment Appearance:: Appears stated age Attitude/Demeanor/Rapport: Unable to Assess Affect (typically observed): Pleasant Orientation: : Oriented to Self, Oriented to Place, Oriented to Situation Alcohol / Substance Use: Not Applicable Psych Involvement: No (comment)  Admission diagnosis:  Facial twitching [G51.4] Patient Active Problem List   Diagnosis Date Noted  . Facial twitching 09/01/2019  . Stage 3a chronic kidney disease 09/01/2019  . DNR (do not resuscitate) 09/01/2019  . COVID-19 virus infection 07/30/2019  . Essential hypertension 07/30/2019  . Dyslipidemia 07/30/2019  . Pre-syncope 07/30/2019  . Viral gastroenteritis due to [place] agent 09/19/2018  . Acute kidney injury superimposed on CKD ([place]) 09/18/2018  . Diarrhea 09/18/2018  . Urinary tract infection 09/18/2018  . Generalized weakness 09/18/2018  . Arthritis 09/18/2018   PCP:  Mellody Drown,  Carloyn Manner, MD Pharmacy:   [facility] [address], [place] [address] [place] [address] Phone: [phone] Fax: [phone]  [facility] [place], [place] - [address] AT [place] [address] [place] [address] Phone: [phone] Fax: [phone]  [facility] [place], [place] - [place]  [address] Phone: [phone] Fax: [phone]     Social Determinants of Health (SDOH) Interventions    Readmission Risk Interventions No flowsheet data found.

## 2019-09-02 NOTE — Discharge Summary (Signed)
Physician Discharge Summary  MALLORI ARAQUE XNA:[id number] DOB: [birth year] DOA: 08/31/2019  PCP: Ralene Ok, MD  Admit date: 08/31/2019 Discharge date: 09/02/2019  Admitted From: Home Disposition:  Home  Discharge Condition:Stable CODE STATUS: DNR Diet recommendation:  Dysphagia 3 diet  Brief/Interim Summary:  HPI: Robin Moreno is a 86 y.o. female with medical history significant of HTN and HLD presenting with twitching.  Her son woke up to give her a bath and noticed her lips and face "jumping and jerking up and down."  She had difficulty talking.  He gave her some melatonin for sleep based on recommendation from house call doctor.  He woke her up out of sleep and she was confused recently.  She seemed weak recently, difficulty lifting her fork on the left side a few days ago.  She has trouble moving her right side and hearing out of her right ear at baseline.  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   She was also admitted from 5/3-4 for presyncope and COVID-19 infection.  ED Course:  Active at baseline.  Brought in for "twitching" of all extremities and slurred speech, aphasia - atypical.  Talked to Dr. Otelia Limes, recommended MRI.  Taper meds, stop neurontin, consult if needed.    Hospital Course: Her hospital course remained stable.  Twitching resolved after admission.  Head MRI did not show any acute intracranial abnormalities.  She was seen by physical therapy and recommended skilled nursing facility but family wants to take her home.  She is hemodynamically stable for discharge to home today with home health.  Following problems were addressed during hospitalization:  Facial twitching -Patient was noted at home to have facial twitching and so was sent in for evaluation -No twitching noted on presentation -Patient d/w neurology by the EDP with recommendation to stop Robitussin, taper Neurontin, and obtain MRI -MRI without acute findings  HTN -Continue Cardizem and  enalapril -Hydrochlorothiazide discotinued  HLD -She does not appear to be taking medications for this issue at this time  Stage 3a CKD -Appears to be stable at this time  H/o COVID-19 infection -Patient with no current symptoms associated with recent infection -Her test was positive on 5/3 -No isolation needed   Discharge Diagnoses:  Principal Problem:   Facial twitching Active Problems:   COVID-19 virus infection   Essential hypertension   Dyslipidemia   Stage 3a chronic kidney disease   DNR (do not resuscitate)    Discharge Instructions  Discharge Instructions    Diet general   Complete by: As directed    Dysphagia 3 diet   Discharge instructions   Complete by: As directed    1)Please take prescribed medications as instructed. 2)Follow up with your primary care physician in a week.   Increase activity slowly   Complete by: As directed      Allergies as of 09/02/2019   No Known Allergies     Medication List    STOP taking these medications   guaiFENesin-dextromethorphan 100-10 MG/5ML syrup Commonly known as: ROBITUSSIN DM   hydrochlorothiazide 25 MG tablet Commonly known as: HYDRODIURIL   traMADol 50 MG tablet Commonly known as: ULTRAM     TAKE these medications   acetaminophen 325 MG tablet Commonly known as: Tylenol Take 2 tablets (650 mg total) by mouth every 6 (six) hours as needed.   Aspirin Low Dose 81 MG EC tablet Generic drug: aspirin Take 81 mg by mouth daily.   diltiazem 60 MG tablet Commonly known as: CARDIZEM Take  60 mg by mouth 2 (two) times daily.   enalapril 10 MG tablet Commonly known as: VASOTEC Take 10 mg by mouth daily.   gabapentin 100 MG capsule Commonly known as: Neurontin Take 1 capsule (100 mg total) by mouth 3 (three) times daily. What changed:   medication strength  how much to take  how to take this  when to take this  additional instructions   meclizine 12.5 MG tablet Commonly known as:  ANTIVERT Take 1 tablet (12.5 mg total) by mouth 3 (three) times daily as needed for dizziness.   Muscle Rub 10-15 % Crea Apply 1 application topically as needed (for sore muscle).   SYSTANE OP Place 1 drop into both eyes every morning.   Vitamin D 50 MCG (2000 UT) tablet Take 2,000 Units by mouth daily.      Follow-up Information    Ralene Ok, MD. Schedule an appointment as soon as possible for a visit in 1 week(s).   Specialty: Internal Medicine Contact information: [address] [place] [address] [phone]          No Known Allergies  Consultations:  None   Procedures/Studies: CT HEAD WO CONTRAST  Result Date: 08/31/2019 CLINICAL DATA:  Generalized twitching of all 4 extremities EXAM: CT HEAD WITHOUT CONTRAST TECHNIQUE: Contiguous axial images were obtained from the base of the skull through the vertex without intravenous contrast. COMPARISON:  01/04/2019 FINDINGS: Brain: There are chronic small vessel ischemic changes scattered throughout the periventricular white matter and bilateral basal ganglia. Stable chronic left parietal cortical infarct. No signs of acute infarct or hemorrhage. Lateral ventricles and remaining midline structures are unremarkable. No acute extra-axial fluid collections. No mass effect. Vascular: No hyperdense vessel or unexpected calcification. Skull: Normal. Negative for fracture or focal lesion. Sinuses/Orbits: No acute finding. Other: None. IMPRESSION: 1. Stable chronic ischemic changes.  No acute intracranial process. Electronically Signed   By: Sharlet Salina M.D.   On: 08/31/2019 21:40   MR BRAIN WO CONTRAST  Result Date: 09/01/2019 CLINICAL DATA:  Twitching of the extremities. EXAM: MRI HEAD WITHOUT CONTRAST TECHNIQUE: Multiplanar, multiecho pulse sequences of the brain and surrounding structures were obtained without intravenous contrast. COMPARISON:  Head CT 08/31/2019 FINDINGS: Brain: Diffusion imaging does not show any acute or  subacute infarction. There are chronic small-vessel ischemic changes affecting the pons. No focal cerebellar insult. Cerebral hemispheres show chronic small-vessel ischemic changes throughout the white matter. There is an old left parietal cortical and subcortical infarction. No evidence of mass lesion, recent hemorrhage, hydrocephalus or extra-axial collection. Some hemosiderin deposition is present at the site of the old left parietal stroke. Vascular: Major vessels at the base of the brain show flow. Skull and upper cervical spine: Negative Sinuses/Orbits: Clear/normal Other: None IMPRESSION: No acute or reversible finding. Chronic small-vessel ischemic changes affecting the pons and cerebral hemispheric white matter. Old left parietal infarction with some hemosiderin deposition. Electronically Signed   By: Paulina Fusi M.D.   On: 09/01/2019 12:50   DG Chest Portable 1 View  Result Date: 09/01/2019 CLINICAL DATA:  Shakiness. EXAM: PORTABLE CHEST 1 VIEW COMPARISON:  07/30/2019 FINDINGS: Cardiac silhouette is normal in size. No mediastinal or hilar masses. No evidence of adenopathy. Clear lungs.  No pleural effusion or pneumothorax. Skeletal structures are demineralized but grossly intact. IMPRESSION: No active disease. Electronically Signed   By: Amie Portland M.D.   On: 09/01/2019 06:13      Subjective: Patient seen and examined at the bedside this morning.  Hemodynamically stable for discharge to home today.  Discharge Exam: Vitals:   09/02/19 0800 09/02/19 1225  BP: (!) 143/60 (!) 117/58  Pulse: 88 90  Resp: 18 18  Temp: 98.7 F (37.1 C) 99.1 F (37.3 C)  SpO2: 97% 100%   Vitals:   09/02/19 0024 09/02/19 0354 09/02/19 0800 09/02/19 1225  BP: 120/67 (!) 113/58 (!) 143/60 (!) 117/58  Pulse: 86 90 88 90  Resp: 19 18 18 18   Temp: 97.7 F (36.5 C) 98.6 F (37 C) 98.7 F (37.1 C) 99.1 F (37.3 C)  TempSrc: Oral Oral Oral Oral  SpO2: 98% 98% 97% 100%  Weight:      Height:         General: Pt is alert, awake, not in acute distress Cardiovascular: RRR, S1/S2 +, no rubs, no gallops Respiratory: CTA bilaterally, no wheezing, no rhonchi Abdominal: Soft, NT, ND, bowel sounds + Extremities: no edema, no cyanosis    The results of significant diagnostics from this hospitalization (including imaging, microbiology, ancillary and laboratory) are listed below for reference.     Microbiology: No results found for this or any previous visit (from the past 240 hour(s)).   Labs: BNP (last 3 results) Recent Labs    07/30/19 2255  BNP 30.9   Basic Metabolic Panel: Recent Labs  Lab 08/31/19 1936 08/31/19 2032 09/01/19 0629  NA 137 139  --   K 3.8 3.6  --   CL 103 104  --   CO2 22  --   --   GLUCOSE 220* 220*  --   BUN 18 20  --   CREATININE 1.15* 1.10*  --   CALCIUM 9.0  --   --   MG  --   --  2.0   Liver Function Tests: Recent Labs  Lab 08/31/19 1936  AST 24  ALT 15  ALKPHOS 91  BILITOT 0.5  PROT 6.9  ALBUMIN 3.0*   No results for input(s): LIPASE, AMYLASE in the last 168 hours. Recent Labs  Lab 09/01/19 0629  AMMONIA 22   CBC: Recent Labs  Lab 08/31/19 1936 08/31/19 2032  WBC 6.5  --   NEUTROABS 3.3  --   HGB 10.9* 11.2*  HCT 35.8* 33.0*  MCV 95.2  --   PLT 285  --    Cardiac Enzymes: No results for input(s): CKTOTAL, CKMB, CKMBINDEX, TROPONINI in the last 168 hours. BNP: Invalid input(s): POCBNP CBG: Recent Labs  Lab 09/01/19 0746  GLUCAP 96   D-Dimer No results for input(s): DDIMER in the last 72 hours. Hgb A1c No results for input(s): HGBA1C in the last 72 hours. Lipid Profile No results for input(s): CHOL, HDL, LDLCALC, TRIG, CHOLHDL, LDLDIRECT in the last 72 hours. Thyroid function studies No results for input(s): TSH, T4TOTAL, T3FREE, THYROIDAB in the last 72 hours.  Invalid input(s): FREET3 Anemia work up No results for input(s): VITAMINB12, FOLATE, FERRITIN, TIBC, IRON, RETICCTPCT in the last 72  hours. Urinalysis    Component Value Date/Time   COLORURINE YELLOW 07/30/2019 2210   APPEARANCEUR CLEAR 07/30/2019 2210   LABSPEC 1.032 (H) 07/30/2019 2210   PHURINE 6.0 07/30/2019 2210   GLUCOSEU NEGATIVE 07/30/2019 2210   HGBUR NEGATIVE 07/30/2019 2210   BILIRUBINUR NEGATIVE 07/30/2019 2210   KETONESUR NEGATIVE 07/30/2019 2210   PROTEINUR NEGATIVE 07/30/2019 2210   NITRITE NEGATIVE 07/30/2019 2210   LEUKOCYTESUR NEGATIVE 07/30/2019 2210   Sepsis Labs Invalid input(s): PROCALCITONIN,  WBC,  LACTICIDVEN Microbiology No results found for this  or any previous visit (from the past 240 hour(s)).  Please note: You were cared for by a hospitalist during your hospital stay. Once you are discharged, your primary care physician will handle any further medical issues. Please note that NO REFILLS for any discharge medications will be authorized once you are discharged, as it is imperative that you return to your primary care physician (or establish a relationship with a primary care physician if you do not have one) for your post hospital discharge needs so that they can reassess your need for medications and monitor your lab values.    Time coordinating discharge: 40 minutes  SIGNED:   Shelly Coss, MD  [facility] 09/02/2019, 12:57 PM Pager [phone]  If 7PM-7AM, please contact night-coverage [email or web address] Password [email or web address]

## 2019-09-02 NOTE — Evaluation (Signed)
Clinical/Bedside Swallow Evaluation Patient Details  Name: Robin Moreno MRN: [id number] Date of Birth: 03-15-1926  Today's Date: 09/02/2019 Time: SLP Start Time (ACUTE ONLY): 0903 SLP Stop Time (ACUTE ONLY): 0917 SLP Time Calculation (min) (ACUTE ONLY): 14 min  Past Medical History:  Past Medical History:  Diagnosis Date  . Arthritis   . Degenerative disk disease   . Hypercholesterolemia   . Hypertension   . Osteoarthritis of right knee   . Renal disorder   . Stage 3a chronic kidney disease 09/01/2019   Past Surgical History:  Past Surgical History:  Procedure Laterality Date  . ABDOMINAL HYSTERECTOMY    . TOTAL HIP ARTHROPLASTY     HPI:  86 y.o.female with PMH significant for arthritis, HTN, renal disorder. Pt also with history of bilateral lower extremity weakness for a few months but woke up from a nap and was found to have bilateral upper arm tremors and twitching along with facial twitching as well. MRI negative for acute abnormality.   Assessment / Plan / Recommendation Clinical Impression  Pt was seen for a bedside swallow evaluation and she presents with suspected mild oropharyngeal dysphagia.  Pt was awake/alert and agreeable to this evaluation.  Oral mechanism exam was unremarkable.  Pt consumed trials of thin liquid, puree, and regular solids.  She exhibited prolonged mastication of regular solids, but no oral residue was observed.  Overt s/sx of aspiration were noted with thin liquid via straw sip c/b an immediate cough in 2/2 trials.  No clinical s/sx of aspiration were noted with any other trials.  Recommend initiation of Dysphagia 3 (soft) solids and thin liquids (NO STRAWS) with medications administered whole in puree.  SLP will f/u to monitor diet tolerance.     SLP Visit Diagnosis: Dysphagia, unspecified (R13.10)    Aspiration Risk  Mild aspiration risk    Diet Recommendation Dysphagia 3 (Mech soft);Thin liquid   Liquid Administration via: Cup;No straw Medication  Administration: Whole meds with puree Supervision: Patient able to self feed;Intermittent supervision to cue for compensatory strategies Compensations: Minimize environmental distractions;Slow rate;Small sips/bites Postural Changes: Seated upright at 90 degrees    Other  Recommendations Oral Care Recommendations: Oral care BID   Follow up Recommendations Other (comment)(TBD)      Frequency and Duration min 2x/week  2 weeks       Prognosis Prognosis for Safe Diet Advancement: Fair      Swallow Study   General HPI: 86 y.o.female with PMH significant for arhtritis, HTN, renal disorder. Pt also with history of bilateral lower extremity weakness for a few months but woke up from a nap and was found to have bilateral upper arm tremors and twitching along with facial twitching as well. MRI negative for acute abnormality. Type of Study: Bedside Swallow Evaluation Previous Swallow Assessment: None  Diet Prior to this Study: NPO Temperature Spikes Noted: Yes Respiratory Status: Room air History of Recent Intubation: No Behavior/Cognition: Alert;Cooperative;Pleasant mood Oral Cavity Assessment: Within Functional Limits Oral Care Completed by SLP: No Oral Cavity - Dentition: Dentures, top;Dentures, bottom Vision: Functional for self-feeding Self-Feeding Abilities: Able to feed self Patient Positioning: Upright in bed Baseline Vocal Quality: Normal Volitional Cough: Strong Volitional Swallow: Able to elicit    Oral/Motor/Sensory Function Overall Oral Motor/Sensory Function: Within functional limits   Ice Chips Ice chips: Within functional limits Presentation: Spoon   Thin Liquid Thin Liquid: Impaired Presentation: Cup;Spoon;Straw;Self Fed Pharyngeal  Phase Impairments: Suspected delayed Swallow;Cough - Immediate    Nectar Thick Nectar Thick Liquid:  Not tested   Honey Thick Honey Thick Liquid: Not tested   Puree Puree: Within functional limits Presentation: Spoon   Solid      Solid: Impaired Presentation: Self Fed Oral Phase Impairments: Impaired mastication Oral Phase Functional Implications: Prolonged oral transit;Impaired mastication     Villa Herb., M.S., CCC-SLP Acute Rehabilitation Services Office: [phone]  Shanon Rosser Luz Burcher 09/02/2019,9:38 AM

## 2019-09-02 NOTE — Progress Notes (Signed)
Confirmed with Lawerance Sabal, RN (case management) that pt is set up for discharge.  Called son, [name] to provide education regarding discharge,  All questions addressed. Peripheral Iv removed, telemetry called, awaiting PTAR.

## 2019-09-02 NOTE — Progress Notes (Signed)
PTAR arrived at 1710 to pickup pt.  All belonging returned to pt, pt made aware of transport home.

## 2019-09-02 NOTE — Care Management Obs Status (Deleted)
MEDICARE OBSERVATION STATUS NOTIFICATION   Patient Details  Name: Robin Moreno MRN: [id number] Date of Birth: 04-25-1925   Medicare Observation Status Notification Given:     Given over the phone verbal consent to sign that she received notice  Lockie Pares, RN 09/02/2019, 10:14 AM

## 2019-10-06 IMAGING — CT CT ANGIOGRAPHY HEAD
1 of 4 series · 2 of 16 positions shown · IV contrast (iopamidol)
Comparison: Head CT 07/23/2018

CLINICAL DATA: Follow-up of transient ischemic attacks and vertigo.
Left ear hearing loss.

EXAM:
CT ANGIOGRAPHY HEAD AND NECK
TECHNIQUE: Multidetector CT imaging of the head and neck was performed using
the standard protocol during bolus administration of intravenous
contrast. Multiplanar CT image reconstructions and MIPs were
obtained to evaluate the vascular anatomy. Carotid stenosis
measurements (when applicable) are obtained utilizing NASCET
criteria, using the distal internal carotid diameter as the
denominator.
CONTRAST:  75mL CAL9FL-A7Q IOPAMIDOL (CAL9FL-A7Q) INJECTION 76%

[Series 8: head/neck angio · axial · 0.58mm/px · z∈[-235,-115]mm · 2 of 180 slices shown]
[im 60/180  soft-tissue]
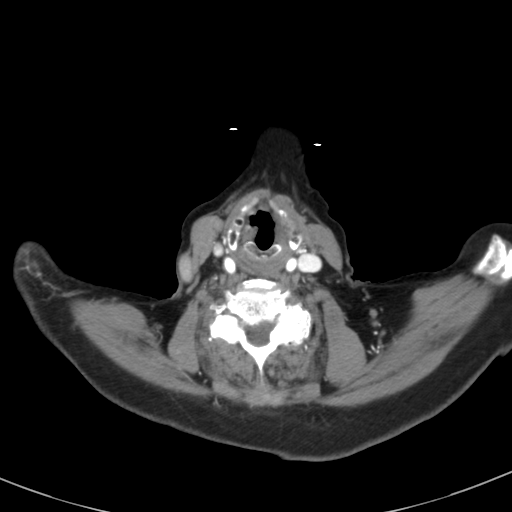
[im 120/180  bone]
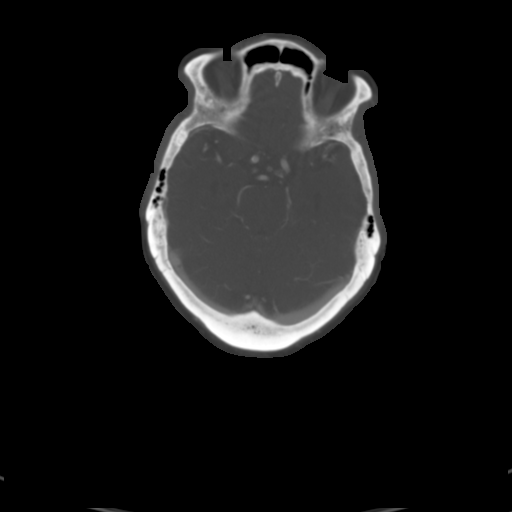

[2 of 16 positions shown; findings below may reference images not displayed]

FINDINGS: CT HEAD FINDINGS

Brain: There is no mass, hemorrhage or extra-axial collection. There
is generalized atrophy without lobar predilection. There is no acute
or chronic infarction. There is hypoattenuation of the
periventricular white matter, most commonly indicating chronic
ischemic microangiopathy.

Skull: The visualized skull base, calvarium and extracranial soft
tissues are normal.

Sinuses/Orbits: No fluid levels or advanced mucosal thickening of
the visualized paranasal sinuses. No mastoid or middle ear effusion.
The orbits are normal.

CTA NECK FINDINGS

SKELETON: Multilevel degenerative disc disease and facet hypertrophy
without bony spinal canal stenosis.

OTHER NECK: Normal pharynx, larynx and major salivary glands. No
cervical lymphadenopathy. Unremarkable thyroid gland.

UPPER CHEST: No pneumothorax or pleural effusion. No nodules or
masses.

AORTIC ARCH:

There is no calcific atherosclerosis of the aortic arch. There is no
aneurysm, dissection or hemodynamically significant stenosis of the
visualized ascending aorta and aortic arch.

Conventional 3 vessel aortic branching pattern.

The visualized proximal subclavian arteries are widely patent.

RIGHT CAROTID SYSTEM:

--Common carotid artery: Widely patent origin without common carotid
artery dissection or aneurysm.

--Internal carotid artery: No dissection, occlusion or aneurysm.
There is predominantly calcified atherosclerosis extending into the
proximal ICA, resulting in approximately 25% stenosis.

--External carotid artery: No acute abnormality.

LEFT CAROTID SYSTEM:

--Common carotid artery: Widely patent origin without common carotid
artery dissection or aneurysm.

--Internal carotid artery: No dissection, occlusion or aneurysm.
Mild atherosclerotic calcification at the carotid bifurcation
without hemodynamically significant stenosis.

--External carotid artery: No acute abnormality.

VERTEBRAL ARTERIES: Codominant configuration.

Both origins are clearly patent.

No dissection, occlusion or flow-limiting stenosis to the skull base
(V1-V3 segments).

CTA HEAD FINDINGS

POSTERIOR CIRCULATION:

--Vertebral arteries: Normal V4 segments.

--Posterior inferior cerebellar arteries (PICA): Patent origins from
the vertebral arteries.

--Anterior inferior cerebellar arteries (AICA): Patent origins from
the basilar artery.

--Basilar artery: Normal.

--Superior cerebellar arteries: Normal.

--Posterior cerebral arteries (PCA): Normal. Both originate from the
basilar artery. Posterior communicating arteries (p-comm) are
diminutive or absent.

ANTERIOR CIRCULATION:

--Intracranial internal carotid arteries: Atherosclerotic
calcification of the internal carotid arteries at the skull base
without hemodynamically significant stenosis.

--Anterior cerebral arteries (ACA): Normal. Both A1 segments are
present. Patent anterior communicating artery (a-comm).

--Middle cerebral arteries (MCA): Normal.

VENOUS SINUSES: As permitted by contrast timing, patent.

ANATOMIC VARIANTS: None

Review of the MIP images confirms the above findings.
IMPRESSION: 1. No emergent large vessel occlusion or hemodynamically significant
stenosis by NASCET criteria.
2. Mild bilateral carotid bifurcation calcific atherosclerosis with
less than 25% stenosis.
3.  Aortic atherosclerosis ([id number]).

## 2019-11-16 ENCOUNTER — Emergency Department (HOSPITAL_COMMUNITY)
Admission: EM | Admit: 2019-11-16 | Discharge: 2019-11-17 | Disposition: A | Discharge status: Home / Self Care | Payer: Medicare Other | Source: Home / Self Care | Attending: Emergency Medicine | Admitting: Emergency Medicine

## 2019-11-16 ENCOUNTER — Emergency Department (HOSPITAL_COMMUNITY): Payer: Medicare Other

## 2019-11-16 DIAGNOSIS — J9600 Acute respiratory failure, unspecified whether with hypoxia or hypercapnia: Secondary | ICD-10-CM | POA: Diagnosis not present

## 2019-11-16 DIAGNOSIS — I129 Hypertensive chronic kidney disease with stage 1 through stage 4 chronic kidney disease, or unspecified chronic kidney disease: Secondary | ICD-10-CM | POA: Insufficient documentation

## 2019-11-16 DIAGNOSIS — Z79899 Other long term (current) drug therapy: Secondary | ICD-10-CM | POA: Insufficient documentation

## 2019-11-16 DIAGNOSIS — U071 COVID-19: Secondary | ICD-10-CM

## 2019-11-16 DIAGNOSIS — N1831 Chronic kidney disease, stage 3a: Secondary | ICD-10-CM | POA: Insufficient documentation

## 2019-11-16 DIAGNOSIS — M62838 Other muscle spasm: Secondary | ICD-10-CM

## 2019-11-16 DIAGNOSIS — Z7982 Long term (current) use of aspirin: Secondary | ICD-10-CM | POA: Insufficient documentation

## 2019-11-16 DIAGNOSIS — R092 Respiratory arrest: Secondary | ICD-10-CM | POA: Diagnosis not present

## 2019-11-16 LAB — CBC WITH DIFFERENTIAL/PLATELET
Abs Immature Granulocytes: 0.05 10*3/uL (ref 0.00–0.07)
Basophils Absolute: 0 10*3/uL (ref 0.0–0.1)
Basophils Relative: 1 %
Eosinophils Absolute: 0.1 10*3/uL (ref 0.0–0.5)
Eosinophils Relative: 2 %
HCT: 42.2 % (ref 36.0–46.0)
Hemoglobin: 12.6 g/dL (ref 12.0–15.0)
Immature Granulocytes: 1 %
Lymphocytes Relative: 20 %
Lymphs Abs: 1.3 10*3/uL (ref 0.7–4.0)
MCH: 29.6 pg (ref 26.0–34.0)
MCHC: 29.9 g/dL — ABNORMAL LOW (ref 30.0–36.0)
MCV: 99.1 fL (ref 80.0–100.0)
Monocytes Absolute: 0.4 10*3/uL (ref 0.1–1.0)
Monocytes Relative: 7 %
Neutro Abs: 4.4 10*3/uL (ref 1.7–7.7)
Neutrophils Relative %: 69 %
Platelets: 227 10*3/uL (ref 150–400)
RBC: 4.26 MIL/uL (ref 3.87–5.11)
RDW: 13.2 % (ref 11.5–15.5)
WBC: 6.3 10*3/uL (ref 4.0–10.5)
nRBC: 0 % (ref 0.0–0.2)

## 2019-11-16 LAB — COMPREHENSIVE METABOLIC PANEL
ALT: 15 U/L (ref 0–44)
AST: 24 U/L (ref 15–41)
Albumin: 3.3 g/dL — ABNORMAL LOW (ref 3.5–5.0)
Alkaline Phosphatase: 81 U/L (ref 38–126)
Anion gap: 8 (ref 5–15)
BUN: 16 mg/dL (ref 8–23)
CO2: 25 mmol/L (ref 22–32)
Calcium: 9.3 mg/dL (ref 8.9–10.3)
Chloride: 108 mmol/L (ref 98–111)
Creatinine, Ser: 0.9 mg/dL (ref 0.44–1.00)
GFR calc Af Amer: >60 mL/min (ref >60)
GFR calc non Af Amer: 56 mL/min — ABNORMAL LOW (ref >60)
Glucose, Bld: 122 mg/dL — ABNORMAL HIGH (ref 70–99)
Potassium: 5.1 mmol/L (ref 3.5–5.1)
Sodium: 141 mmol/L (ref 135–145)
Total Bilirubin: 0.5 mg/dL (ref 0.3–1.2)
Total Protein: 7.2 g/dL (ref 6.5–8.1)

## 2019-11-16 LAB — SARS CORONAVIRUS 2 BY RT PCR (HOSPITAL ORDER, PERFORMED IN ~~LOC~~ HOSPITAL LAB): SARS Coronavirus 2: POSITIVE — AB

## 2019-11-16 NOTE — ED Provider Notes (Addendum)
[facility] EMERGENCY DEPARTMENT Provider Note   CSN: [id number] Arrival date & time: 11/16/19  1546     History Chief Complaint  Patient presents with  . Leg Pain    Robin Moreno is a 86 y.o. female.  HPI   Patient with significant medical history of arthritis, stage III kidney disease, osteoarthritis of right knee, hypertension presents to the emergency department with chief complaint of bilateral leg weakness and twitchiness.  She said the twitchiness has been going on for the last week and a half, states it feels like crampy-like twitching sensation that comes and goes.  She also admits that she has increased weakness in her legs, unable to lift them up off the bed like she used too with home PT.  She denies any recent trauma to the area, is bedbound, denies any pedal edema.  Patient was admitted to the hospital on 06/05 for full body twitching, MRI of head did not show any acute abnormalities, patient was backed off gabapentin from 300 mg down to 100 mg which seems to have fix the problem.  She denies any other complaints at this time.  She denies headache, fever, chills, shortness of breath, chest pain, abdominal pain, pedal edema.  Past Medical History:  Diagnosis Date  . Arthritis   . Degenerative disk disease   . Hypercholesterolemia   . Hypertension   . Osteoarthritis of right knee   . Renal disorder   . Stage 3a chronic kidney disease 09/01/2019    Patient Active Problem List   Diagnosis Date Noted  . Facial twitching 09/01/2019  . Stage 3a chronic kidney disease 09/01/2019  . DNR (do not resuscitate) 09/01/2019  . COVID-19 virus infection 07/30/2019  . Essential hypertension 07/30/2019  . Dyslipidemia 07/30/2019  . Pre-syncope 07/30/2019  . Viral gastroenteritis due to [place] agent 09/19/2018  . Acute kidney injury superimposed on CKD (HCC) 09/18/2018  . Diarrhea 09/18/2018  . Urinary tract infection 09/18/2018  . Generalized weakness  09/18/2018  . Arthritis 09/18/2018    Past Surgical History:  Procedure Laterality Date  . ABDOMINAL HYSTERECTOMY    . TOTAL HIP ARTHROPLASTY       OB History   No obstetric history on file.     No family history on file.  Social History   Tobacco Use  . Smoking status: Never Smoker  . Smokeless tobacco: Never Used  Vaping Use  . Vaping Use: Never used  Substance Use Topics  . Alcohol use: No  . Drug use: No    Home Medications Prior to Admission medications   Medication Sig Start Date End Date Taking? Authorizing Provider  acetaminophen (TYLENOL) 325 MG tablet Take 2 tablets (650 mg total) by mouth every 6 (six) hours as needed. 01/04/19   Derwood Kaplan, MD  ASPIRIN LOW DOSE 81 MG EC tablet Take 81 mg by mouth daily. 06/11/19   [provider]  Cholecalciferol (VITAMIN D) 2000 UNITS tablet Take 2,000 Units by mouth daily.      [provider]  diltiazem (CARDIZEM) 60 MG tablet Take 60 mg by mouth 2 (two) times daily.    [provider]  enalapril (VASOTEC) 10 MG tablet Take 10 mg by mouth daily.    [provider]  gabapentin (NEURONTIN) 100 MG capsule Take 1 capsule (100 mg total) by mouth 3 (three) times daily. 09/02/19   Burnadette Pop, MD  meclizine (ANTIVERT) 12.5 MG tablet Take 1 tablet (12.5 mg total) by mouth 3 (  three) times daily as needed for dizziness. 07/23/18   Pricilla Loveless, MD  Menthol-Methyl Salicylate (MUSCLE RUB) 10-15 % CREA Apply 1 application topically as needed (for sore muscle).    [provider]  Polyethyl Glycol-Propyl Glycol (SYSTANE OP) Place 1 drop into both eyes every morning.    [provider]    Allergies    Patient has no known allergies.  Review of Systems   Review of Systems  Constitutional: Negative for chills and fever.  HENT: Negative for congestion, tinnitus and trouble swallowing.   Eyes: Negative for visual disturbance.  Respiratory: Positive for cough. Negative for  shortness of breath.   Cardiovascular: Negative for chest pain and leg swelling.  Gastrointestinal: Negative for abdominal pain, diarrhea, nausea and vomiting.  Genitourinary: Negative for enuresis.  Musculoskeletal: Negative for back pain.       Bilateral leg pain and twitching.  Skin: Negative for rash.  Neurological: Positive for weakness. Negative for dizziness.  Hematological: Does not bruise/bleed easily.    Physical Exam Updated Vital Signs BP (!) 127/59 (BP Location: Right Arm)   Pulse 70   Temp 98.1 F (36.7 C) (Oral)   Resp (!) 21   SpO2 95%   Physical Exam Vitals and nursing note reviewed.  Constitutional:      General: She is not in acute distress.    Appearance: Normal appearance. She is not ill-appearing or diaphoretic.  HENT:     Head: Normocephalic and atraumatic.     Nose: No congestion or rhinorrhea.     Mouth/Throat:     Mouth: Mucous membranes are moist.     Pharynx: Oropharynx is clear.  Eyes:     General: No visual field deficit or scleral icterus.       Right eye: No discharge.        Left eye: No discharge.     Conjunctiva/sclera: Conjunctivae normal.     Pupils: Pupils are equal, round, and reactive to light.  Cardiovascular:     Rate and Rhythm: Normal rate and regular rhythm.     Pulses: Normal pulses.     Heart sounds: No murmur heard.  No friction rub. No gallop.   Pulmonary:     Effort: Pulmonary effort is normal. No respiratory distress.     Breath sounds: No wheezing, rhonchi or rales.  Abdominal:     General: There is no distension.     Palpations: Abdomen is soft.     Tenderness: There is no abdominal tenderness. There is no right CVA tenderness, left CVA tenderness or guarding.  Musculoskeletal:        General: No swelling or tenderness.     Cervical back: Neck supple. No rigidity or tenderness.     Right lower leg: No edema.     Left lower leg: No edema.  Skin:    General: Skin is warm and dry.     Capillary Refill: Capillary  refill takes less than 2 seconds.     Coloration: Skin is not jaundiced or pale.  Neurological:     Mental Status: She is alert and oriented to person, place, and time.     GCS: GCS eye subscore is 4. GCS verbal subscore is 5. GCS motor subscore is 6.     Cranial Nerves: Cranial nerves are intact. No cranial nerve deficit or facial asymmetry.     Sensory: Sensory deficit present.     Motor: Weakness present. No pronator drift.     Coordination: Coordination  is intact. Romberg sign negative. Finger-Nose-Finger Test and Heel to [facility] Test normal.     Comments: Patient's upper extremities did not have any noticeable weakness, full range of motion, sensory fully intact.  Lower extremities showed bilateral weakness at the hip flexor, unable to lift leg off bed.  She also had numbness dorsal side of both feet.  She is able to flex and extend at the knee and ankle.  Psychiatric:        Mood and Affect: Mood normal.     ED Results / Procedures / Treatments   Labs (all labs ordered are listed, but only abnormal results are displayed) Labs Reviewed  SARS CORONAVIRUS 2 BY RT PCR (HOSPITAL ORDER, PERFORMED IN [facility]) - Abnormal; Notable for the following components:      Result Value   SARS Coronavirus 2 POSITIVE (*)    All other components within normal limits  COMPREHENSIVE METABOLIC PANEL - Abnormal; Notable for the following components:   Glucose, Bld 122 (*)    Albumin 3.3 (*)    GFR calc non Af Amer 56 (*)    All other components within normal limits  CBC WITH DIFFERENTIAL/PLATELET - Abnormal; Notable for the following components:   MCHC 29.9 (*)    All other components within normal limits  URINALYSIS, ROUTINE W REFLEX MICROSCOPIC    EKG EKG Interpretation  Date/Time:  Friday November 16 2019 15:56:49 EDT Ventricular Rate:  68 PR Interval:    QRS Duration: 95 QT Interval:  400 QTC Calculation: 426 R Axis:   37 Text Interpretation: Sinus rhythm Prolonged PR  interval Posterior infarct, old Nonspecific T abnormalities, lateral leads Confirmed by Kristine Royal [phone]) on 11/16/2019 4:06:23 PM   Radiology DG Chest Port 1 View  Result Date: 11/16/2019 CLINICAL DATA:  Bilateral lower extremity spasms for 1 week. EXAM: PORTABLE CHEST 1 VIEW COMPARISON:  Single-view of the chest 09/01/2019. FINDINGS: Lungs are clear. Heart size is normal. No pneumothorax or pleural effusion. Aortic atherosclerosis. No acute or focal bony abnormality. IMPRESSION: No acute disease. Aortic Atherosclerosis (ICD10-I70.0). Electronically Signed   By: Drusilla Kanner M.D.   On: 11/16/2019 16:32    Procedures Procedures (including critical care time)  Medications Ordered in ED Medications - No data to display  ED Course  I have reviewed the triage vital signs and the nursing notes.  Pertinent labs & imaging results that were available during my care of the patient were reviewed by me and considered in my medical decision making (see chart for details).    MDM Rules/Calculators/A&P                          I have personally reviewed all imaging, labs and have interpreted them.  Patient presents with bilateral leg weakness and twitchiness.  She was alert and oriented did not appear to be in acute distress, vital signs reassuring.  On exam patient had weakness bilaterally at her hip flexor, she was able to flex and extend at the knee and ankles, had paresthesias on the dorsal side of her feet bilaterally, no pedal edema, good pedal pulses good capillary refill.  Will order screening labs and reassess.   Initial labs shows CBC no signs of leukocytosis, anemia, BMP does not show electrolyte abnormalities, no signs of elevated liver enzymes, or AKI.  Chest x-ray was unremarkable.  Covid positive.  No signs of respiratory distress, no nasal flaring or chest retractions, no new oxygen  requirements noted.  I have low suspicion for systemic infection as patient was  nontoxic-appearing, vital signs reassuring, no leukocytosis seen on CBC.  Low suspicion for metabolic abnormality as CMP does not show any electrolyte abnormalities.  Low suspicion for CVA or intracranial head bleed as patient had no focal deficits seen in the upper extremities, there was weakness seen bilaterally at the hip flexors.  This is possible from deconditioned state as she has been bedbound for the last year, she is also taking gabapentin which could result in worsening neuropathy and leg spasms.  Low suspicion for DVT as she denies leg pain but complains more of leg spasms, no pedal edema noted on exam, unlikely both legs have a DVT.  Low suspicion for cardiac abnormality as patient denies chest pain, shortness of breath, EKG does not show signs of ischemia.  Low suspicion for intra-abdominal abnormality requiring surgical intervention as she denies abdominal pain, abdominal exam was benign.  Low suspicion patient will require hospitalization due to Covid as she has had Covid in the past, has had a Covid vaccine, she has had no new oxygen requirements, no signs of respiratory failure seen on exam.  Will consult infusion clinic for possible treatment with infusions if she meets criteria.  Due to well appearing patient benign physical exam further lab work and imaging are not warranted.  Patient appears to be resting comfortably in bed show no acute signs disstress.  Vital signs are remained stable does not meet criteria to be admitted to the hospital.  Differential for leg weakness includes deconditioned state, medication induced gabapentin, neuropathy.  I recommend further follow-up at primary care provider or neurology for further evaluation.  Patient is Covid positive provided her with contact information for post Covid care, as well as contacting the infusion clinic for possible infusion treatment.  Patient was discussed with attending who saw patient and agrees with assessment and plan.  Patient was  given at home care as well as strict return precautions.  Patient verbalized that she understood and agreed with said plan.   Final Clinical Impression(s) / ED Diagnoses Final diagnoses:  Muscle spasm of both lower legs  COVID-19 virus infection    Rx / DC Orders ED Discharge Orders    None       Carroll Sage, PA-C 11/16/19 1850    Carroll Sage, PA-C 11/16/19 2031    Wynetta Fines, MD 11/16/19 [phone]

## 2019-11-16 NOTE — ED Triage Notes (Signed)
Pt arrives via EMS from home with complaints of bilateral leg spasms X1 weeks. Pt alert X3 at baseline.   116/64 72 HR 16 RR 95% RA

## 2019-11-16 NOTE — Discharge Instructions (Addendum)
You have been seen here for leg spasms, exam and lab work all look reassuring.  I recommend continue taking your home medications as prescribed.  You have been diagnosed with Covid I need you to self quarantine for the next 10 days.  I have given you the contact information for post Covid care please call them for further instructions.  Please stay hydrated drink plenty of water.    I have given you the contact information for neurologist please call if spasms worsening.  I do recommend that you follow-up with your primary care provider as I feel you will need further evaluation management.  I want to come back to emergency department if you develop chest pain, shortness of breath, severe abdominal pain, uncontrolled nausea, vomiting, diarrhea as these symptoms require further evaluation and management.

## 2019-11-17 ENCOUNTER — Emergency Department (HOSPITAL_COMMUNITY): Payer: Medicare Other

## 2019-11-17 ENCOUNTER — Inpatient Hospital Stay (HOSPITAL_COMMUNITY)
Admission: EM | Admit: 2019-11-17 | Discharge: 2019-11-22 | DRG: 208 | Disposition: A | Discharge status: Home Health | Payer: Medicare Other | Attending: Internal Medicine | Admitting: Internal Medicine

## 2019-11-17 DIAGNOSIS — I2699 Other pulmonary embolism without acute cor pulmonale: Secondary | ICD-10-CM | POA: Diagnosis present

## 2019-11-17 DIAGNOSIS — N1831 Chronic kidney disease, stage 3a: Secondary | ICD-10-CM | POA: Diagnosis present

## 2019-11-17 DIAGNOSIS — U071 COVID-19: Secondary | ICD-10-CM

## 2019-11-17 DIAGNOSIS — Z7401 Bed confinement status: Secondary | ICD-10-CM | POA: Diagnosis not present

## 2019-11-17 DIAGNOSIS — G9341 Metabolic encephalopathy: Secondary | ICD-10-CM | POA: Diagnosis present

## 2019-11-17 DIAGNOSIS — Z90710 Acquired absence of both cervix and uterus: Secondary | ICD-10-CM

## 2019-11-17 DIAGNOSIS — Z66 Do not resuscitate: Secondary | ICD-10-CM | POA: Diagnosis present

## 2019-11-17 DIAGNOSIS — E7800 Pure hypercholesterolemia, unspecified: Secondary | ICD-10-CM | POA: Diagnosis present

## 2019-11-17 DIAGNOSIS — Z0184 Encounter for antibody response examination: Secondary | ICD-10-CM

## 2019-11-17 DIAGNOSIS — L89152 Pressure ulcer of sacral region, stage 2: Secondary | ICD-10-CM | POA: Diagnosis present

## 2019-11-17 DIAGNOSIS — I129 Hypertensive chronic kidney disease with stage 1 through stage 4 chronic kidney disease, or unspecified chronic kidney disease: Secondary | ICD-10-CM | POA: Diagnosis present

## 2019-11-17 DIAGNOSIS — R0603 Acute respiratory distress: Secondary | ICD-10-CM | POA: Diagnosis not present

## 2019-11-17 DIAGNOSIS — E785 Hyperlipidemia, unspecified: Secondary | ICD-10-CM | POA: Diagnosis present

## 2019-11-17 DIAGNOSIS — I340 Nonrheumatic mitral (valve) insufficiency: Secondary | ICD-10-CM | POA: Diagnosis not present

## 2019-11-17 DIAGNOSIS — L89616 Pressure-induced deep tissue damage of right heel: Secondary | ICD-10-CM | POA: Diagnosis present

## 2019-11-17 DIAGNOSIS — R4182 Altered mental status, unspecified: Secondary | ICD-10-CM

## 2019-11-17 DIAGNOSIS — R7989 Other specified abnormal findings of blood chemistry: Secondary | ICD-10-CM | POA: Diagnosis not present

## 2019-11-17 DIAGNOSIS — I429 Cardiomyopathy, unspecified: Secondary | ICD-10-CM | POA: Diagnosis present

## 2019-11-17 DIAGNOSIS — I352 Nonrheumatic aortic (valve) stenosis with insufficiency: Secondary | ICD-10-CM | POA: Diagnosis not present

## 2019-11-17 DIAGNOSIS — Z79899 Other long term (current) drug therapy: Secondary | ICD-10-CM | POA: Diagnosis not present

## 2019-11-17 DIAGNOSIS — L8990 Pressure ulcer of unspecified site, unspecified stage: Secondary | ICD-10-CM | POA: Diagnosis not present

## 2019-11-17 DIAGNOSIS — Z7982 Long term (current) use of aspirin: Secondary | ICD-10-CM | POA: Diagnosis not present

## 2019-11-17 DIAGNOSIS — J9600 Acute respiratory failure, unspecified whether with hypoxia or hypercapnia: Secondary | ICD-10-CM | POA: Diagnosis present

## 2019-11-17 DIAGNOSIS — I480 Paroxysmal atrial fibrillation: Secondary | ICD-10-CM | POA: Diagnosis not present

## 2019-11-17 DIAGNOSIS — Z96649 Presence of unspecified artificial hip joint: Secondary | ICD-10-CM | POA: Diagnosis present

## 2019-11-17 DIAGNOSIS — R092 Respiratory arrest: Secondary | ICD-10-CM | POA: Insufficient documentation

## 2019-11-17 DIAGNOSIS — I350 Nonrheumatic aortic (valve) stenosis: Secondary | ICD-10-CM | POA: Diagnosis not present

## 2019-11-17 DIAGNOSIS — I959 Hypotension, unspecified: Secondary | ICD-10-CM | POA: Diagnosis present

## 2019-11-17 DIAGNOSIS — L89626 Pressure-induced deep tissue damage of left heel: Secondary | ICD-10-CM | POA: Diagnosis present

## 2019-11-17 DIAGNOSIS — Z8616 Personal history of COVID-19: Secondary | ICD-10-CM

## 2019-11-17 DIAGNOSIS — M62838 Other muscle spasm: Secondary | ICD-10-CM | POA: Diagnosis present

## 2019-11-17 HISTORY — DX: Cerebral infarction, unspecified: I63.9

## 2019-11-17 LAB — URINALYSIS, ROUTINE W REFLEX MICROSCOPIC
Bilirubin Urine: NEGATIVE
Glucose, UA: NEGATIVE mg/dL
Hgb urine dipstick: NEGATIVE
Ketones, ur: NEGATIVE mg/dL
Leukocytes,Ua: NEGATIVE
Nitrite: NEGATIVE
Protein, ur: NEGATIVE mg/dL
Specific Gravity, Urine: 1.013 (ref 1.005–1.030)
pH: 5 (ref 5.0–8.0)

## 2019-11-17 LAB — CBC WITH DIFFERENTIAL/PLATELET
Abs Immature Granulocytes: 0.08 10*3/uL — ABNORMAL HIGH (ref 0.00–0.07)
Basophils Absolute: 0 10*3/uL (ref 0.0–0.1)
Basophils Relative: 0 %
Eosinophils Absolute: 0.2 10*3/uL (ref 0.0–0.5)
Eosinophils Relative: 3 %
HCT: 31 % — ABNORMAL LOW (ref 36.0–46.0)
Hemoglobin: 9.7 g/dL — ABNORMAL LOW (ref 12.0–15.0)
Immature Granulocytes: 1 %
Lymphocytes Relative: 26 %
Lymphs Abs: 2.3 10*3/uL (ref 0.7–4.0)
MCH: 30.1 pg (ref 26.0–34.0)
MCHC: 31.3 g/dL (ref 30.0–36.0)
MCV: 96.3 fL (ref 80.0–100.0)
Monocytes Absolute: 0.7 10*3/uL (ref 0.1–1.0)
Monocytes Relative: 7 %
Neutro Abs: 5.7 10*3/uL (ref 1.7–7.7)
Neutrophils Relative %: 63 %
Platelets: 239 10*3/uL (ref 150–400)
RBC: 3.22 MIL/uL — ABNORMAL LOW (ref 3.87–5.11)
RDW: 13.2 % (ref 11.5–15.5)
WBC: 9 10*3/uL (ref 4.0–10.5)
nRBC: 0 % (ref 0.0–0.2)

## 2019-11-17 LAB — I-STAT ARTERIAL BLOOD GAS, ED
Acid-base deficit: 1 mmol/L (ref 0.0–2.0)
Bicarbonate: 23.7 mmol/L (ref 20.0–28.0)
Calcium, Ion: 1.16 mmol/L (ref 1.15–1.40)
HCT: 26 % — ABNORMAL LOW (ref 36.0–46.0)
Hemoglobin: 8.8 g/dL — ABNORMAL LOW (ref 12.0–15.0)
O2 Saturation: 100 %
Potassium: 3.8 mmol/L (ref 3.5–5.1)
Sodium: 141 mmol/L (ref 135–145)
TCO2: 25 mmol/L (ref 22–32)
pCO2 arterial: 39.4 mmHg (ref 32.0–48.0)
pH, Arterial: 7.386 (ref 7.350–7.450)
pO2, Arterial: 409 mmHg — ABNORMAL HIGH (ref 83.0–108.0)

## 2019-11-17 LAB — COMPREHENSIVE METABOLIC PANEL
ALT: 22 U/L (ref 0–44)
AST: 32 U/L (ref 15–41)
Albumin: 2.6 g/dL — ABNORMAL LOW (ref 3.5–5.0)
Alkaline Phosphatase: 68 U/L (ref 38–126)
Anion gap: 10 (ref 5–15)
BUN: 16 mg/dL (ref 8–23)
CO2: 18 mmol/L — ABNORMAL LOW (ref 22–32)
Calcium: 8.1 mg/dL — ABNORMAL LOW (ref 8.9–10.3)
Chloride: 110 mmol/L (ref 98–111)
Creatinine, Ser: 0.95 mg/dL (ref 0.44–1.00)
GFR calc Af Amer: >60 mL/min (ref >60)
GFR calc non Af Amer: 52 mL/min — ABNORMAL LOW (ref >60)
Glucose, Bld: 213 mg/dL — ABNORMAL HIGH (ref 70–99)
Potassium: 4 mmol/L (ref 3.5–5.1)
Sodium: 138 mmol/L (ref 135–145)
Total Bilirubin: 0.7 mg/dL (ref 0.3–1.2)
Total Protein: 5.7 g/dL — ABNORMAL LOW (ref 6.5–8.1)

## 2019-11-17 LAB — I-STAT VENOUS BLOOD GAS, ED
Acid-base deficit: 1 mmol/L (ref 0.0–2.0)
Bicarbonate: 20.2 mmol/L (ref 20.0–28.0)
Calcium, Ion: 1.05 mmol/L — ABNORMAL LOW (ref 1.15–1.40)
HCT: 28 % — ABNORMAL LOW (ref 36.0–46.0)
Hemoglobin: 9.5 g/dL — ABNORMAL LOW (ref 12.0–15.0)
O2 Saturation: 99 %
Potassium: 3.6 mmol/L (ref 3.5–5.1)
Sodium: 141 mmol/L (ref 135–145)
TCO2: 21 mmol/L — ABNORMAL LOW (ref 22–32)
pCO2, Ven: 23.5 mmHg — ABNORMAL LOW (ref 44.0–60.0)
pH, Ven: 7.543 — ABNORMAL HIGH (ref 7.250–7.430)
pO2, Ven: 134 mmHg — ABNORMAL HIGH (ref 32.0–45.0)

## 2019-11-17 LAB — AMMONIA: Ammonia: 13 umol/L (ref 9–35)

## 2019-11-17 LAB — LACTIC ACID, PLASMA: Lactic Acid, Venous: 2.2 mmol/L (ref 0.5–1.9)

## 2019-11-17 LAB — TROPONIN I (HIGH SENSITIVITY): Troponin I (High Sensitivity): 10 ng/L (ref <18)

## 2019-11-17 MED ORDER — DOCUSATE SODIUM 50 MG/5ML PO LIQD
100.0000 mg | Freq: Two times a day (BID) | ORAL | Status: DC
  Administered 2019-11-18 (×2): 100 mg via ORAL
  Filled 2019-11-17 (×2): qty 10

## 2019-11-17 MED ORDER — SODIUM CHLORIDE 0.9 % IV SOLN: INTRAVENOUS | Status: DC

## 2019-11-17 MED ORDER — POLYETHYLENE GLYCOL 3350 17 G PO PACK: 17.0000 g | PACK | Freq: Every day | ORAL | Status: DC | PRN

## 2019-11-17 MED ORDER — ENOXAPARIN SODIUM 40 MG/0.4ML ~~LOC~~ SOLN
40.0000 mg | SUBCUTANEOUS | Status: DC
  Administered 2019-11-18 – 2019-11-19 (×3): 40 mg via SUBCUTANEOUS
  Filled 2019-11-17 (×3): qty 0.4

## 2019-11-17 MED ORDER — FENTANYL CITRATE (PF) 100 MCG/2ML IJ SOLN
25.0000 ug | INTRAMUSCULAR | Status: DC | PRN
  Administered 2019-11-18: 100 ug via INTRAVENOUS
  Filled 2019-11-17: qty 2

## 2019-11-17 MED ORDER — FENTANYL CITRATE (PF) 100 MCG/2ML IJ SOLN: 25.0000 ug | INTRAMUSCULAR | Status: DC | PRN

## 2019-11-17 MED ORDER — PANTOPRAZOLE SODIUM 40 MG IV SOLR
40.0000 mg | Freq: Every day | INTRAVENOUS | Status: DC
  Administered 2019-11-18 (×2): 40 mg via INTRAVENOUS
  Filled 2019-11-17 (×2): qty 40

## 2019-11-17 MED ORDER — MIDAZOLAM HCL 2 MG/2ML IJ SOLN
1.0000 mg | INTRAMUSCULAR | Status: DC | PRN
  Administered 2019-11-17: 1 mg via INTRAVENOUS

## 2019-11-17 MED ORDER — MIDAZOLAM HCL 2 MG/2ML IJ SOLN
1.0000 mg | INTRAMUSCULAR | Status: DC | PRN
  Administered 2019-11-17: 1 mg via INTRAVENOUS
  Filled 2019-11-17 (×2): qty 2

## 2019-11-17 MED ORDER — SODIUM CHLORIDE 0.9 % IV BOLUS
1000.0000 mL | Freq: Once | INTRAVENOUS | Status: AC
  Administered 2019-11-17: 1000 mL via INTRAVENOUS

## 2019-11-17 MED ORDER — ASPIRIN EC 81 MG PO TBEC
81.0000 mg | DELAYED_RELEASE_TABLET | Freq: Every day | ORAL | Status: DC
  Filled 2019-11-17: qty 1

## 2019-11-17 MED ORDER — FENTANYL CITRATE (PF) 100 MCG/2ML IJ SOLN
50.0000 ug | INTRAMUSCULAR | Status: DC | PRN
  Administered 2019-11-17: 50 ug via INTRAVENOUS
  Filled 2019-11-17 (×2): qty 2

## 2019-11-17 MED ORDER — POLYETHYLENE GLYCOL 3350 17 G PO PACK
17.0000 g | PACK | Freq: Every day | ORAL | Status: DC
  Administered 2019-11-18: 17 g via ORAL
  Filled 2019-11-17: qty 1

## 2019-11-17 MED ORDER — IOHEXOL 350 MG/ML SOLN
75.0000 mL | Freq: Once | INTRAVENOUS | Status: AC | PRN
  Administered 2019-11-17: 75 mL via INTRAVENOUS

## 2019-11-17 MED ORDER — NOREPINEPHRINE 4 MG/250ML-% IV SOLN
INTRAVENOUS | Status: AC
  Administered 2019-11-17: 2.5 ug/kg/min
  Filled 2019-11-17: qty 250

## 2019-11-17 MED ORDER — DOCUSATE SODIUM 100 MG PO CAPS: 100.0000 mg | ORAL_CAPSULE | Freq: Two times a day (BID) | ORAL | Status: DC | PRN

## 2019-11-17 MED ORDER — INSULIN ASPART 100 UNIT/ML ~~LOC~~ SOLN
0.0000 [IU] | SUBCUTANEOUS | Status: DC
  Administered 2019-11-18: 3 [IU] via SUBCUTANEOUS

## 2019-11-17 MED ORDER — FENTANYL CITRATE (PF) 100 MCG/2ML IJ SOLN
50.0000 ug | INTRAMUSCULAR | Status: DC | PRN
  Administered 2019-11-17: 50 ug via INTRAVENOUS

## 2019-11-17 NOTE — H&P (Signed)
NAME:  Robin Moreno, MRN:  [id number], DOB:  08/04/1925, LOS: 0 ADMISSION DATE:  11/17/2019, CONSULTATION DATE:  11/17/19 REFERRING MD:  EDP, CHIEF COMPLAINT:  Respiratory failure    Brief History   86 y.o. F with OMH of CKD, arthritis, chronic immobility, Covid-19 infection in 07/2019 requiring hospital admission without intubation who was found with agonal breathing and unresponsive and was intubated.  Brought to the ED where CT head and chest without evidence of PE or CVA. She has been vaccinated for Covid-19, Covid test positive in the ED without infiltrates on CXR.  PCCM consulted for admission  History of present illness   Robin Moreno is a 86 y.o. F with OMH of CKD, arthritis, chronic immobility, Covid-19 infection in 07/2019 requiring hospital admission without intubation and vaccinated for Covid-19.  She lives with her son and who reported that she was in her usual state of health this morning.  He phoned her around 2:15 pm the day of admission and said she had been sleeping.  He got home a short time later and found her between the bed and the rail with her neck pressed against the rail and unresponsive with agonal breathing.  EMS intubated in the field and started on epi gtt for hypotension.    ED workup included CT head and CT chest which were negative for acute hemorrhage, PE or significant infiltrates.  Labs without leukocytosis, significant electrolyte derangement or elevated troponin.  Lactic acid 2.2, ABG with alkalosis.     Son states that she had the [employer] vaccine about three months ago, she has had a mild, nagging cough for weeks which they were not too concerned about.  No known ill contacts.   Past Medical History   has a past medical history of Arthritis, Degenerative disk disease, Hypercholesterolemia, Hypertension, Osteoarthritis of right knee, Renal disorder, and Stage 3a chronic kidney disease (09/01/2019).   Significant Hospital Events   8/21 Admit to Critical  Care  Consults:    Procedures:  8/21 ETT  Significant Diagnostic Tests:  8/21 Head CT/C-spine>>No acute intracranial process. Stable chronic left parietal infarct. No acute cervical spine fracture. 8/21 CTA Chest>> Negative for pulmonary embolism.  Basilar airspace disease, likely atelectasis.   Micro Data:  8/21 Sars-CoV-2>>positive  Antimicrobials:     Interim history/subjective:  Pt off pressors during ED course  Objective   Blood pressure (!) 147/89, pulse 80, temperature (!) 97.2 F (36.2 C), temperature source Temporal, resp. rate 18, height 5\' 6"  (1.676 m), SpO2 97 %.    Vent Mode: PRVC FiO2 (%):  [60 %-100 %] 60 % Set Rate:  [16 bmp] 16 bmp Vt Set:  [70 mL-470 mL] 70 mL PEEP:  [5 cmH20] 5 cmH20 Plateau Pressure:  [14 [email or web address] Hospital At Park[facility] NoAmbulatory Surgery Center Of NiagararMercy Medical [email or web address] [facility] M27med ies: warm/dry, 1+ edema  Skin: no rashes or lesions   Resolved Hospital Problem list     Assessment & Plan:   Acute Respiratory Failure, Covid-19 positive Story sounds most consistent with a bed-bound patient who got unfortunately stuck between railing  and mattress with mechanical compression on her airway.  As she has both had Covid and been vaccinated, I do not think an active Covid infection contributed to her respiratory distress.  Only on Neurontin as far as sedating medications and son administers, so unlikely medication related -Check inflammatory markers, if negative further evidence no Remdesevir or Steroids indicated  -CT head negative, obtain MRI brain to ensure no  stroke -EEG -Maintain full vent support with SAT/SBT as tolerated -titrate Vent setting to maintain SpO2 greater than or equal to 90%. -HOB elevated 30 degrees. -Plateau pressures less than 30 cm H20.  -Follow chest x-ray, ABG prn.   -Bronchial hygiene and RT/bronchodilator protocol. -Aggressive care, but no chest compressions or defibrillation   HTN -continue Asa, hold Cardizem and Enalapril in the setting of presenting hypotension  Arthritis -hold Neurontin  Best practice:  Diet: NPO Pain/Anxiety/Delirium protocol (if indicated): prn Fentanyl VAP protocol (if indicated): HOB 30 degrees, suction prn DVT prophylaxis: Lovenox GI prophylaxis: protonix Glucose control: SSI Mobility: bed rest Code Status: DNR Family Communication: Son updated Disposition: ICU  Labs   CBC: Recent Labs  Lab 11/16/19 1701 11/17/19 1635 11/17/19 1640 11/17/19 1822  WBC 6.3  --  9.0  --   NEUTROABS 4.4  --  5.7  --   HGB 12.6 9.5* 9.7* 8.8*  HCT 42.2 28.0* 31.0* 26.0*  MCV 99.1  --  96.3  --   PLT 227  --  239  --     Basic Metabolic Panel: Recent Labs  Lab 11/16/19 1701 11/17/19 1635 11/17/19 1640 11/17/19 1822  NA 141 141 138 141  K 5.1 3.6 4.0 3.8  CL 108  --  110  --   CO2 25  --  18*  --   GLUCOSE 122*  --  213*  --   BUN 16  --  16  --   CREATININE 0.90  --  0.95  --   CALCIUM 9.3  --  8.1*  --    GFR: CrCl cannot be calculated (Unknown ideal weight.). Recent Labs  Lab 11/16/19 1701 11/17/19 1640 11/17/19 1641  WBC 6.3 9.0  --   LATICACIDVEN  --   --  2.2*    Liver Function Tests: Recent Labs  Lab 11/16/19 1701 11/17/19 1640  AST 24 32  ALT 15 22  ALKPHOS 81 68  BILITOT 0.5 0.7  PROT 7.2 5.7*  ALBUMIN 3.3* 2.6*   No results for input(s): LIPASE, AMYLASE in the last 168 hours. Recent Labs  Lab 11/17/19 1641  AMMONIA 13    ABG    Component Value Date/Time   PHART 7.386 11/17/2019 1822   PCO2ART 39.4 11/17/2019 1822   PO2ART 409 (H) 11/17/2019  1822   HCO3 23.7 11/17/2019 1822   TCO2 25 11/17/2019 1822   ACIDBASEDEF 1.0 11/17/2019 1822   O2SAT 100.0 11/17/2019 1822     Coagulation Profile: No results for input(s): INR, PROTIME in the last 168 hours.  Cardiac Enzymes: No results for input(s): CKTOTAL, CKMB, CKMBINDEX, TROPONINI in the last 168 hours.  HbA1C: No results found for: HGBA1C  CBG: No results for input(s): GLUCAP in the last 168 hours.  Review of Systems:   Unable to obtain secondary to mental status  Past Medical History  She,  has a past medical history of Arthritis, Degenerative disk disease, Hypercholesterolemia, Hypertension, Osteoarthritis of right knee, Renal disorder, and Stage 3a chronic kidney disease (09/01/2019).   Surgical History    Past Surgical  History:  Procedure Laterality Date  . ABDOMINAL HYSTERECTOMY    . TOTAL HIP ARTHROPLASTY       Social History   reports that she has never smoked. She has never used smokeless tobacco. She reports that she does not drink alcohol and does not use drugs.   Family History   Her family history is not on file.   Allergies No Known Allergies   Home Medications  Prior to Admission medications   Medication Sig Start Date End Date Taking? Authorizing Provider  acetaminophen (TYLENOL) 325 MG tablet Take 2 tablets (650 mg total) by mouth every 6 (six) hours as needed. 01/04/19   Derwood Kaplan, MD  ASPIRIN LOW DOSE 81 MG EC tablet Take 81 mg by mouth daily. 06/11/19   [provider]  Cholecalciferol (VITAMIN D) 2000 UNITS tablet Take 2,000 Units by mouth daily.      [provider]  diltiazem (CARDIZEM) 60 MG tablet Take 60 mg by mouth 2 (two) times daily.    [provider]  enalapril (VASOTEC) 10 MG tablet Take 10 mg by mouth daily.    [provider]  gabapentin (NEURONTIN) 100 MG capsule Take 1 capsule (100 mg total) by mouth 3 (three) times daily. 09/02/19   Burnadette Pop, MD  meclizine (ANTIVERT) 12.5 MG tablet  Take 1 tablet (12.5 mg total) by mouth 3 (three) times daily as needed for dizziness. 07/23/18   Pricilla Loveless, MD  Menthol-Methyl Salicylate (MUSCLE RUB) 10-15 % CREA Apply 1 application topically as needed (for sore muscle).    [provider]  Polyethyl Glycol-Propyl Glycol (SYSTANE OP) Place 1 drop into both eyes every morning.    [provider]     Critical care time: 55 minutes    CRITICAL CARE Performed by: Darcella Gasman Anaja Monts   Total critical care time: 55 minutes  Critical care time was exclusive of separately billable procedures and treating other patients.  Critical care was necessary to treat or prevent imminent or life-threatening deterioration.  Critical care was time spent personally by me on the following activities: development of treatment plan with patient and/or surrogate as well as nursing, discussions with consultants, evaluation of patient's response to treatment, examination of patient, obtaining history from patient or surrogate, ordering and performing treatments and interventions, ordering and review of laboratory studies, ordering and review of radiographic studies, pulse oximetry and re-evaluation of patient's condition.  Darcella Gasman Quentyn Kolbeck, PA-C

## 2019-11-17 NOTE — ED Provider Notes (Addendum)
[facility] EMERGENCY DEPARTMENT Provider Note   CSN: [id number] Arrival date & time: 11/17/19  1557     History Chief Complaint  Patient presents with  . Altered Mental Status    Robin Moreno is a 86 y.o. female.  Patient is a 86 year old female with a history of chronic kidney disease, prior Covid infection and vaccination, ongoing debilitation and now mostly bedbound living at home with her son who presents today with EMS after being found agonal he breathing, unresponsive and was intubated in the field.  Patient has been hypotensive throughout transport and was started on 4 mL of epinephrine.  She had received 250 mL of fluid but was starting to cough and mildly respond and was given 5 mg of Versed.  Patient was seen in the emergency room yesterday for lower extremity weakness and twitching.  At that time labs were stable but she did test Covid positive.  It was unclear if patient was having a new Covid infection versus ongoing positive testing from her prior Covid infection.  Son reports that today he called her on his way home about 2:15 PM and she answered the phone and said that he had woke her up.  He got home at 230 and she was in between the bed rail with her neck pushed up against the rail and was not breathing or responding.  He moved her out of the rail and she took of breath but did not otherwise wake up or respond.  He started giving her mouth-to-mouth resuscitation and called 911.  It was unclear what patient saturations were but capnography throughout transport have been in the 30s.  Speaking with her son he states she would want medication to keep her blood pressure up and continued efforts but would not want CPR if her heart were to stop.  He states this morning when she woke up she seemed to be her normal self.  He fed her breakfast and gave her her bath and then went out to run errands.  He does report that patient is mostly bedbound but sometimes when they  get her up and moving she will have a syncopal event because her blood pressure drops.  She is not started any new medications.  The history is provided by the EMS personnel and a caregiver. The history is limited by the condition of the patient.  Altered Mental Status Presenting symptoms: unresponsiveness   Severity:  Severe Most recent episode:  Today Episode history:  Continuous      Past Medical History:  Diagnosis Date  . Arthritis   . Degenerative disk disease   . Hypercholesterolemia   . Hypertension   . Osteoarthritis of right knee   . Renal disorder   . Stage 3a chronic kidney disease 09/01/2019    Patient Active Problem List   Diagnosis Date Noted  . Facial twitching 09/01/2019  . Stage 3a chronic kidney disease 09/01/2019  . DNR (do not resuscitate) 09/01/2019  . COVID-19 virus infection 07/30/2019  . Essential hypertension 07/30/2019  . Dyslipidemia 07/30/2019  . Pre-syncope 07/30/2019  . Viral gastroenteritis due to [place] agent 09/19/2018  . Acute kidney injury superimposed on CKD (HCC) 09/18/2018  . Diarrhea 09/18/2018  . Urinary tract infection 09/18/2018  . Generalized weakness 09/18/2018  . Arthritis 09/18/2018    Past Surgical History:  Procedure Laterality Date  . ABDOMINAL HYSTERECTOMY    . TOTAL HIP ARTHROPLASTY       OB History   No  obstetric history on file.     No family history on file.  Social History   Tobacco Use  . Smoking status: Never Smoker  . Smokeless tobacco: Never Used  Vaping Use  . Vaping Use: Never used  Substance Use Topics  . Alcohol use: No  . Drug use: No    Home Medications Prior to Admission medications   Medication Sig Start Date End Date Taking? Authorizing Provider  acetaminophen (TYLENOL) 325 MG tablet Take 2 tablets (650 mg total) by mouth every 6 (six) hours as needed. 01/04/19   Derwood Kaplan, MD  ASPIRIN LOW DOSE 81 MG EC tablet Take 81 mg by mouth daily. 06/11/19   [provider]   Cholecalciferol (VITAMIN D) 2000 UNITS tablet Take 2,000 Units by mouth daily.      [provider]  diltiazem (CARDIZEM) 60 MG tablet Take 60 mg by mouth 2 (two) times daily.    [provider]  enalapril (VASOTEC) 10 MG tablet Take 10 mg by mouth daily.    [provider]  gabapentin (NEURONTIN) 100 MG capsule Take 1 capsule (100 mg total) by mouth 3 (three) times daily. 09/02/19   Burnadette Pop, MD  meclizine (ANTIVERT) 12.5 MG tablet Take 1 tablet (12.5 mg total) by mouth 3 (three) times daily as needed for dizziness. 07/23/18   Pricilla Loveless, MD  Menthol-Methyl Salicylate (MUSCLE RUB) 10-15 % CREA Apply 1 application topically as needed (for sore muscle).    [provider]  Polyethyl Glycol-Propyl Glycol (SYSTANE OP) Place 1 drop into both eyes every morning.    [provider]    Allergies    Patient has no known allergies.  Review of Systems   Review of Systems  Unable to perform ROS: Intubated    Physical Exam Updated Vital Signs BP (!) 124/93   Pulse 100   Resp (!) 30   Ht 5\' 6"  (1.676 m)   SpO2 100%   BMI 24.91 kg/m   Physical Exam Vitals and nursing note reviewed.  Constitutional:      General: She is not in acute distress.    Appearance: She is well-developed and normal weight.     Comments: Patient is sedated and intubated but does occasionally react to pain and will intermittently blink but not to command  HENT:     Head: Normocephalic and atraumatic.  Eyes:     Comments: Pupils are 2 mm bilaterally and sluggishly reactive  Neck:   Cardiovascular:     Rate and Rhythm: Regular rhythm. Tachycardia present.     Pulses: Normal pulses.     Heart sounds: Normal heart sounds. No murmur heard.  No friction rub.  Pulmonary:     Effort: Pulmonary effort is normal.     Breath sounds: Normal breath sounds. No wheezing or rales.  Abdominal:     General: Bowel sounds are normal. There is no distension.     Palpations:  Abdomen is soft.     Tenderness: There is no abdominal tenderness. There is no guarding or rebound.  Musculoskeletal:        General: No tenderness. Normal range of motion.     Cervical back: Neck supple.     Right lower leg: No edema.     Left lower leg: No edema.     Comments: No edema  Skin:    General: Skin is warm and dry.     Findings: No rash.  Neurological:     Comments: Intermittent  mild twitching is noted in upper and lower extremities.  At this time is intubated and sedated.  Is not following commands.  Psychiatric:     Comments: Patient is intubated and sedated     ED Results / Procedures / Treatments   Labs (all labs ordered are listed, but only abnormal results are displayed) Labs Reviewed  CBC WITH DIFFERENTIAL/PLATELET - Abnormal; Notable for the following components:      Result Value   RBC 3.22 (*)    Hemoglobin 9.7 (*)    HCT 31.0 (*)    Abs Immature Granulocytes 0.08 (*)    All other components within normal limits  COMPREHENSIVE METABOLIC PANEL - Abnormal; Notable for the following components:   CO2 18 (*)    Glucose, Bld 213 (*)    Calcium 8.1 (*)    Total Protein 5.7 (*)    Albumin 2.6 (*)    GFR calc non Af Amer 52 (*)    All other components within normal limits  LACTIC ACID, PLASMA - Abnormal; Notable for the following components:   Lactic Acid, Venous 2.2 (*)    All other components within normal limits  I-STAT VENOUS BLOOD GAS, ED - Abnormal; Notable for the following components:   pH, Ven 7.543 (*)    pCO2, Ven 23.5 (*)    pO2, Ven 134.0 (*)    TCO2 21 (*)    Calcium, Ion 1.05 (*)    HCT 28.0 (*)    Hemoglobin 9.5 (*)    All other components within normal limits  I-STAT ARTERIAL BLOOD GAS, ED - Abnormal; Notable for the following components:   pO2, Arterial 409 (*)    HCT 26.0 (*)    Hemoglobin 8.8 (*)    All other components within normal limits  AMMONIA  URINALYSIS, ROUTINE W REFLEX MICROSCOPIC  BLOOD GAS, ARTERIAL  TROPONIN I  (HIGH SENSITIVITY)  TROPONIN I (HIGH SENSITIVITY)    EKG EKG Interpretation  Date/Time:  Saturday November 17 2019 16:00:37 EDT Ventricular Rate:  100 PR Interval:    QRS Duration: 91 QT Interval:  351 QTC Calculation: 453 R Axis:   53 Text Interpretation: Sinus tachycardia Borderline repolarization abnormality No significant change since last tracing Confirmed by Gwyneth Sprout ([id number]) on 11/17/2019 4:11:37 PM   Radiology CT HEAD WO CONTRAST  Result Date: 11/17/2019 CLINICAL DATA:  Altered level of consciousness, trauma, unresponsive, found down EXAM: CT HEAD WITHOUT CONTRAST CT CERVICAL SPINE WITHOUT CONTRAST TECHNIQUE: Multidetector CT imaging of the head and cervical spine was performed following the standard protocol without intravenous contrast. Multiplanar CT image reconstructions of the cervical spine were also generated. COMPARISON:  08/31/2019 FINDINGS: CT HEAD FINDINGS Brain: Hypodensity left parietal cortex consistent with chronic infarct, stable. No signs of acute infarct or hemorrhage. Lateral ventricles and midline structures are unremarkable. No acute extra-axial fluid collections. No mass effect. Vascular: No hyperdense vessel or unexpected calcification. Skull: Normal. Negative for fracture or focal lesion. Sinuses/Orbits: No acute finding. Other: None. CT CERVICAL SPINE FINDINGS Alignment: Alignment is anatomic. Skull base and vertebrae: No acute displaced fracture. Soft tissues and spinal canal: Patient is intubated, endotracheal tube at level of thoracic inlet. Enteric catheter is identified, distal margin excluded by slice selection. Prevertebral soft tissues are unremarkable. No visible canal hematoma. Disc levels: Stable diffuse facet hypertrophy. Mild spondylosis at C3-4 and C6-7. There is mild diffuse neural foraminal encroachment related to facet hypertrophic change. Findings are stable. Upper chest: Lung apices are clear. Other: Reconstructed images demonstrate  no  additional findings. IMPRESSION: 1. No acute intracranial process. 2. Stable chronic left parietal infarct. 3. No acute cervical spine fracture. Electronically Signed   By: Sharlet Salina M.D.   On: 11/17/2019 18:36   CT Angio Chest PE W and/or Wo Contrast  Result Date: 11/17/2019 CLINICAL DATA:  Unresponsive, axonal breathing. EXAM: CT ANGIOGRAPHY CHEST WITH CONTRAST TECHNIQUE: Multidetector CT imaging of the chest was performed using the standard protocol during bolus administration of intravenous contrast. Multiplanar CT image reconstructions and MIPs were obtained to evaluate the vascular anatomy. CONTRAST:  19mL OMNIPAQUE IOHEXOL 350 MG/ML SOLN COMPARISON:  Chest x-ray same date., remote chest CT from 2005 FINDINGS: Cardiovascular: Calcified atheromatous plaque in the thoracic aorta. Calcifications of the aortic valve. Mitral annular calcifications. No aneurysmal dilation of the thoracic aorta. Limited assessment of the aorta due to bolus timing. Contour is smooth. No pericardial effusion. Heart size is normal. Central pulmonary vascular caliber is normal. Study quality is good. No pulmonary embolism. Mediastinum/Nodes: Endotracheal tube in place terminating just at the thoracic inlet. Balloon inflation just below the larynx. This was previously communicated. Gastric tube passes through the esophagus into the stomach. No adenopathy at the thoracic inlet. No mediastinal adenopathy. No hilar adenopathy. Lungs/Pleura: Basilar airspace disease. No filling defects or fluid in bronchi. No pneumothorax. No lobar consolidation. No pleural effusion. Upper Abdomen: Gallbladder is distended, moderately with gallstones in the dependent portion. No pericholecystic stranding. No peripancreatic stranding. Adrenal glands are normal. No acute process in the incompletely imaged upper abdomen. Musculoskeletal: No acute bone process. No chest wall lesion. No destructive bone finding. Glenohumeral and spinal degenerative  changes. Review of the MIP images confirms the above findings. IMPRESSION: 1. Negative for pulmonary embolism. 2. Basilar airspace disease, likely atelectasis. 3. Endotracheal tube in place terminating just at the thoracic inlet. Balloon inflation just below the larynx. This was previously communicated as outlined on previous chest radiograph. 4. Cholelithiasis without evidence of acute cholecystitis. 5. Aortic atherosclerosis. Aortic Atherosclerosis (ICD10-I70.0). Electronically Signed   By: Donzetta Kohut M.D.   On: 11/17/2019 18:46   CT Cervical Spine Wo Contrast  Result Date: 11/17/2019 CLINICAL DATA:  Altered level of consciousness, trauma, unresponsive, found down EXAM: CT HEAD WITHOUT CONTRAST CT CERVICAL SPINE WITHOUT CONTRAST TECHNIQUE: Multidetector CT imaging of the head and cervical spine was performed following the standard protocol without intravenous contrast. Multiplanar CT image reconstructions of the cervical spine were also generated. COMPARISON:  08/31/2019 FINDINGS: CT HEAD FINDINGS Brain: Hypodensity left parietal cortex consistent with chronic infarct, stable. No signs of acute infarct or hemorrhage. Lateral ventricles and midline structures are unremarkable. No acute extra-axial fluid collections. No mass effect. Vascular: No hyperdense vessel or unexpected calcification. Skull: Normal. Negative for fracture or focal lesion. Sinuses/Orbits: No acute finding. Other: None. CT CERVICAL SPINE FINDINGS Alignment: Alignment is anatomic. Skull base and vertebrae: No acute displaced fracture. Soft tissues and spinal canal: Patient is intubated, endotracheal tube at level of thoracic inlet. Enteric catheter is identified, distal margin excluded by slice selection. Prevertebral soft tissues are unremarkable. No visible canal hematoma. Disc levels: Stable diffuse facet hypertrophy. Mild spondylosis at C3-4 and C6-7. There is mild diffuse neural foraminal encroachment related to facet hypertrophic  change. Findings are stable. Upper chest: Lung apices are clear. Other: Reconstructed images demonstrate no additional findings. IMPRESSION: 1. No acute intracranial process. 2. Stable chronic left parietal infarct. 3. No acute cervical spine fracture. Electronically Signed   By: Sharlet Salina M.D.   On: 11/17/2019 18:36  DG Chest Port 1 View  Result Date: 11/17/2019 CLINICAL DATA:  Respiratory failure EXAM: PORTABLE CHEST 1 VIEW COMPARISON:  11/16/2019 FINDINGS: Endotracheal tube approximately 6.8 cm above the carina. Tip is above the level of the clavicular heads. Gastric tube in the stomach. Cardiomediastinal contours are unchanged, likely normal accounting for low depth of expansion and projection. Lungs are clear. No acute skeletal process on limited assessment. IMPRESSION: 1. No acute cardiopulmonary process on limited assessment. 2. Endotracheal tube, tip above the clavicular heads approximately 7 cm from the carina. Consider slight advancement approximately 2 cm with repeat radiography to document placement. 3. These results were called by telephone at the time of interpretation on 11/17/2019 at 6:22 pm to provider [facility] , who verbally acknowledged these results. Electronically Signed   By: Donzetta Kohut M.D.   On: 11/17/2019 18:22   DG Chest Port 1 View  Result Date: 11/16/2019 CLINICAL DATA:  Bilateral lower extremity spasms for 1 week. EXAM: PORTABLE CHEST 1 VIEW COMPARISON:  Single-view of the chest 09/01/2019. FINDINGS: Lungs are clear. Heart size is normal. No pneumothorax or pleural effusion. Aortic atherosclerosis. No acute or focal bony abnormality. IMPRESSION: No acute disease. Aortic Atherosclerosis (ICD10-I70.0). Electronically Signed   By: Drusilla Kanner M.D.   On: 11/16/2019 16:32    Procedures Procedures (including critical care time)  Medications Ordered in ED Medications  0.9 %  sodium chloride infusion (has no administration in time range)  norepinephrine  (LEVOPHED) 4-5 MG/250ML-% infusion SOLN (has no administration in time range)  fentaNYL (SUBLIMAZE) injection 50 mcg (has no administration in time range)  fentaNYL (SUBLIMAZE) injection 50 mcg (has no administration in time range)  midazolam (VERSED) injection 1 mg (has no administration in time range)  midazolam (VERSED) injection 1 mg (has no administration in time range)    ED Course  I have reviewed the triage vital signs and the nursing notes.  Pertinent labs & imaging results that were available during my care of the patient were reviewed by me and considered in my medical decision making (see chart for details).    MDM Rules/Calculators/A&P                          Elderly female presenting today with concern for respiratory distress and agonal breathing.  Patient was not protecting her airway upon EMS arrival and she was intubated.  There was a 15-minute.  Between when son had spoken with her and when he found her.  Patient is starting to wake up now but is not following commands.  She was given Versed for sedation.  She has been persistently hypotensive and when epinephrine was stopped by EMS pressure dropped to 50 systolic.  She was started on 2.5 mL of norepinephrine with improvement in blood pressure in the low 100s with maps greater than 65.  Son reports that she does have fluctuating blood pressure however she is bedbound and does not take anticoagulation.  Concern for possible PE.  She also tested Covid positive yesterday unclear if this is from prior infection or reinfection.  She has been vaccinated and has had Covid in the past.  Patient is not difficult to ventilate and sats are 100% with a PEEP of 5.  Patient was seen yesterday in the emergency room for lower extremity weakness and labs at that time as well his exam and the patient were reassuring.  Will CT head and C-spine to ensure there was no evidence of  trauma from being stuck in the bed rail and no new significant  stroke/bleed.  Speaking with the son she would not want to have CPR but she would want medication to increase her blood pressure and ventilation per his knowledge.  Labs and imaging are pending.  Will CT to ensure PE is not the cause of hypotension.  EKG without signs of ACS.  Patient will need ICU admission.  6:25 PM   Patient CBC with unchanged anemia with hemoglobin of 9.7, CMP with unchanged findings.  No new AKI.  Lactic acid of 2.2, ammonia within normal limits.  Troponin within normal limits.  Patient's initial venous blood gas showed respiratory alkalosis with a pH of 7.54 PCO2 of 23 and PO2 of 134.  Patient's ventilator settings were adjusted and she was turned down to 60% FiO2.  1 hour later patient had repeat blood gas that was improved with normal pH and CO2 levels.  Patient's chest x-ray did show that the ET tube was high and it was advanced 2 cm.  Will get repeat chest x-ray.  Patient is now off all pressors.  Last blood pressure was 149/70.  Patient is currently on normal saline at 125.  Head and cervical imaging is pending.  CTA currently pending.  Will admit to critical care.  Patient has not required further sedation is currently calm on the vent.  CT of head and neck without acute pathology and c-collar removed.  CAT without PE and bibasilar atelectasis and tube was advanced.  MDM Number of Diagnoses or Management Options   Amount and/or Complexity of Data Reviewed Clinical lab tests: ordered and reviewed Tests in the radiology section of CPT: ordered and reviewed Tests in the medicine section of CPT: ordered and reviewed Decide to obtain previous medical records or to obtain history from someone other than the patient: yes Obtain history from someone other than the patient: yes Review and summarize past medical records: yes Discuss the patient with other providers: yes Independent visualization of images, tracings, or specimens: yes  Risk of Complications, Morbidity, and/or  Mortality Presenting problems: high Diagnostic procedures: moderate Management options: moderate  Patient Progress Patient progress: improved  CRITICAL CARE Performed by: Leanna Hamid Total critical care time: 50 minutes Critical care time was exclusive of separately billable procedures and treating other patients. Critical care was necessary to treat or prevent imminent or life-threatening deterioration. Critical care was time spent personally by me on the following activities: development of treatment plan with patient and/or surrogate as well as nursing, discussions with consultants, evaluation of patient's response to treatment, examination of patient, obtaining history from patient or surrogate, ordering and performing treatments and interventions, ordering and review of laboratory studies, ordering and review of radiographic studies, pulse oximetry and re-evaluation of patient's condition.  ALICEN DONALSON was evaluated in Emergency Department on 11/17/2019 for the symptoms described in the history of present illness. She was evaluated in the context of the global COVID-19 pandemic, which necessitated consideration that the patient might be at risk for infection with the SARS-CoV-2 virus that causes COVID-19. Institutional protocols and algorithms that pertain to the evaluation of patients at risk for COVID-19 are in a state of rapid change based on information released by regulatory bodies including the CDC and federal and state organizations. These policies and algorithms were followed during the patient's care in the ED.   Final Clinical Impression(s) / ED Diagnoses Final diagnoses:  Altered mental status, unspecified altered mental status type  Respiratory arrest (  HCC)  Hypotension, unspecified hypotension type  COVID-19    Rx / DC Orders ED Discharge Orders    None       Gwyneth Sprout, MD 11/17/19 Franco Collet, MD 11/17/19 1900

## 2019-11-17 NOTE — ED Notes (Signed)
Xray notified awaiting imaging for ETT placement

## 2019-11-17 NOTE — ED Notes (Signed)
Robin Moreno called again to get a status update upset because no one has called him back--please call him @ [phone]--[name]

## 2019-11-17 NOTE — Progress Notes (Signed)
Advanced ET tube to 25 per MD request.

## 2019-11-17 NOTE — ED Triage Notes (Signed)
Pt here via ems from home unresponsive. Found by son lying across the bed, slumped over with bar across her neck. Pt with agonal breathing on scene. ? Ligature mark to R neck. Pt intubated with 6.0 ETT with ems. HR 120. Initial BP 60/30, Epi drip started by ems at 22mcg/min. Given 5mg  midazolam en route.

## 2019-11-17 NOTE — ED Notes (Signed)
Robin Moreno son [phone] would like an update on the pt

## 2019-11-18 ENCOUNTER — Inpatient Hospital Stay (HOSPITAL_COMMUNITY): Payer: Medicare Other

## 2019-11-18 ENCOUNTER — Encounter (HOSPITAL_COMMUNITY): Payer: Self-pay | Admitting: Pulmonary Disease

## 2019-11-18 DIAGNOSIS — L8990 Pressure ulcer of unspecified site, unspecified stage: Secondary | ICD-10-CM | POA: Insufficient documentation

## 2019-11-18 LAB — URINALYSIS, ROUTINE W REFLEX MICROSCOPIC
Bilirubin Urine: NEGATIVE
Glucose, UA: NEGATIVE mg/dL
Hgb urine dipstick: NEGATIVE
Ketones, ur: NEGATIVE mg/dL
Leukocytes,Ua: NEGATIVE
Nitrite: NEGATIVE
Protein, ur: NEGATIVE mg/dL
Specific Gravity, Urine: 1.018 (ref 1.005–1.030)
pH: 5 (ref 5.0–8.0)

## 2019-11-18 LAB — BASIC METABOLIC PANEL
Anion gap: 13 (ref 5–15)
BUN: 12 mg/dL (ref 8–23)
CO2: 19 mmol/L — ABNORMAL LOW (ref 22–32)
Calcium: 8.5 mg/dL — ABNORMAL LOW (ref 8.9–10.3)
Chloride: 110 mmol/L (ref 98–111)
Creatinine, Ser: 0.8 mg/dL (ref 0.44–1.00)
GFR calc Af Amer: >60 mL/min (ref >60)
GFR calc non Af Amer: >60 mL/min (ref >60)
Glucose, Bld: 101 mg/dL — ABNORMAL HIGH (ref 70–99)
Potassium: 4.4 mmol/L (ref 3.5–5.1)
Sodium: 142 mmol/L (ref 135–145)

## 2019-11-18 LAB — CBC
HCT: 33.9 % — ABNORMAL LOW (ref 36.0–46.0)
Hemoglobin: 10.7 g/dL — ABNORMAL LOW (ref 12.0–15.0)
MCH: 30.4 pg (ref 26.0–34.0)
MCHC: 31.6 g/dL (ref 30.0–36.0)
MCV: 96.3 fL (ref 80.0–100.0)
Platelets: 244 10*3/uL (ref 150–400)
RBC: 3.52 MIL/uL — ABNORMAL LOW (ref 3.87–5.11)
RDW: 13.2 % (ref 11.5–15.5)
WBC: 10.1 10*3/uL (ref 4.0–10.5)
nRBC: 0 % (ref 0.0–0.2)

## 2019-11-18 LAB — LACTIC ACID, PLASMA
Lactic Acid, Venous: 3.3 mmol/L (ref 0.5–1.9)
Lactic Acid, Venous: 3 mmol/L (ref 0.5–1.9)

## 2019-11-18 LAB — GLUCOSE, CAPILLARY
Glucose-Capillary: 139 mg/dL — ABNORMAL HIGH (ref 70–99)
Glucose-Capillary: 75 mg/dL (ref 70–99)
Glucose-Capillary: 83 mg/dL (ref 70–99)
Glucose-Capillary: 85 mg/dL (ref 70–99)
Glucose-Capillary: 85 mg/dL (ref 70–99)
Glucose-Capillary: 90 mg/dL (ref 70–99)
Glucose-Capillary: 91 mg/dL (ref 70–99)

## 2019-11-18 LAB — MRSA PCR SCREENING: MRSA by PCR: NEGATIVE

## 2019-11-18 LAB — MAGNESIUM: Magnesium: 1.9 mg/dL (ref 1.7–2.4)

## 2019-11-18 LAB — FERRITIN: Ferritin: 187 ng/mL (ref 11–307)

## 2019-11-18 LAB — LACTATE DEHYDROGENASE: LDH: 295 U/L — ABNORMAL HIGH (ref 98–192)

## 2019-11-18 LAB — SAR COV2 SEROLOGY (COVID19)AB(IGG),IA: SARS-CoV-2 Ab, IgG: REACTIVE — AB

## 2019-11-18 LAB — PHOSPHORUS: Phosphorus: 2.8 mg/dL (ref 2.5–4.6)

## 2019-11-18 LAB — D-DIMER, QUANTITATIVE: D-Dimer, Quant: 4.56 ug/mL-FEU — ABNORMAL HIGH (ref 0.00–0.50)

## 2019-11-18 LAB — PROCALCITONIN: Procalcitonin: 0.12 ng/mL

## 2019-11-18 LAB — HEMOGLOBIN A1C
Hgb A1c MFr Bld: 5.6 % (ref 4.8–5.6)
Mean Plasma Glucose: 114.02 mg/dL

## 2019-11-18 LAB — C-REACTIVE PROTEIN: CRP: 10.1 mg/dL — ABNORMAL HIGH (ref <1.0)

## 2019-11-18 LAB — TROPONIN I (HIGH SENSITIVITY): Troponin I (High Sensitivity): 28 ng/L — ABNORMAL HIGH (ref <18)

## 2019-11-18 MED ORDER — SODIUM CHLORIDE 0.9 % IV BOLUS
1000.0000 mL | Freq: Once | INTRAVENOUS | Status: AC
  Administered 2019-11-18: 1000 mL via INTRAVENOUS

## 2019-11-18 MED ORDER — ORAL CARE MOUTH RINSE
15.0000 mL | OROMUCOSAL | Status: DC
  Administered 2019-11-18 – 2019-11-19 (×13): 15 mL via OROMUCOSAL

## 2019-11-18 MED ORDER — LABETALOL HCL 5 MG/ML IV SOLN
10.0000 mg | INTRAVENOUS | Status: DC | PRN
  Administered 2019-11-19 (×2): 10 mg via INTRAVENOUS
  Filled 2019-11-18 (×2): qty 4

## 2019-11-18 MED ORDER — CHLORHEXIDINE GLUCONATE CLOTH 2 % EX PADS
6.0000 | MEDICATED_PAD | Freq: Every day | CUTANEOUS | Status: DC
  Administered 2019-11-18 – 2019-11-22 (×5): 6 via TOPICAL

## 2019-11-18 MED ORDER — CHLORHEXIDINE GLUCONATE 0.12% ORAL RINSE (MEDLINE KIT)
15.0000 mL | Freq: Two times a day (BID) | OROMUCOSAL | Status: DC
  Administered 2019-11-18 (×3): 15 mL via OROMUCOSAL

## 2019-11-18 MED ORDER — FENTANYL CITRATE (PF) 100 MCG/2ML IJ SOLN: 25.0000 ug | INTRAMUSCULAR | Status: DC | PRN

## 2019-11-18 MED ORDER — PHENYLEPHRINE HCL-NACL 10-0.9 MG/250ML-% IV SOLN
25.0000 ug/min | INTRAVENOUS | Status: DC
  Administered 2019-11-18: 25 ug/min via INTRAVENOUS
  Filled 2019-11-18: qty 250

## 2019-11-18 MED ORDER — VANCOMYCIN HCL IN DEXTROSE 1-5 GM/200ML-% IV SOLN
1000.0000 mg | INTRAVENOUS | Status: DC
  Administered 2019-11-19: 1000 mg via INTRAVENOUS
  Filled 2019-11-18: qty 200

## 2019-11-18 MED ORDER — SODIUM CHLORIDE 0.9 % IV SOLN: 250.0000 mL | INTRAVENOUS | Status: DC

## 2019-11-18 MED ORDER — VANCOMYCIN HCL 1500 MG/300ML IV SOLN
1500.0000 mg | Freq: Once | INTRAVENOUS | Status: AC
  Administered 2019-11-18: 1500 mg via INTRAVENOUS
  Filled 2019-11-18: qty 300

## 2019-11-18 MED ORDER — SODIUM CHLORIDE 0.9 % IV SOLN
2.0000 g | Freq: Two times a day (BID) | INTRAVENOUS | Status: DC
  Administered 2019-11-18 – 2019-11-19 (×3): 2 g via INTRAVENOUS
  Filled 2019-11-18 (×3): qty 2

## 2019-11-18 MED ORDER — ASPIRIN 81 MG PO CHEW
81.0000 mg | CHEWABLE_TABLET | Freq: Every day | ORAL | Status: DC
  Administered 2019-11-18 – 2019-11-20 (×3): 81 mg via ORAL
  Filled 2019-11-18 (×3): qty 1

## 2019-11-18 MED ORDER — METOPROLOL TARTRATE 5 MG/5ML IV SOLN
2.5000 mg | Freq: Once | INTRAVENOUS | Status: AC | PRN
  Administered 2019-11-18: 2.5 mg via INTRAVENOUS
  Filled 2019-11-18: qty 5

## 2019-11-18 NOTE — ED Notes (Signed)
Respiratory called, sending transport

## 2019-11-18 NOTE — Progress Notes (Signed)
eLink Physician-Brief Progress Note Patient Name: Robin Moreno DOB: Nov 18, 1925 MRN: [id number]   Date of Service  11/18/2019  HPI/Events of Note  Lactic Acid = 2.2 --> 3.3.   eICU Interventions  Plan: 1. Bolus with 0.9 NaCl 1 liter IV over 1 hour now.      Intervention Category Major Interventions: Acid-Base disturbance - evaluation and management  Arlin Sass [place] 11/18/2019, 5:29 AM

## 2019-11-18 NOTE — Progress Notes (Signed)
Pharmacy Antibiotic Note  Robin Moreno is a 86 y.o. female admitted on 11/17/2019 with sepsis.  Pharmacy has been consulted for Vancomycin and Cefepime dosing.  Wt ~70 kg, est CrCl ~ 40 ml/min  Plan: Cefepime 2gm IV q12h Vancomycin 1500mg  IV now then 1000 mg IV Q 24 hrs.  Will f/u renal function, micro data, and pt's clinical condition Vanc levels prn   Height: 5\' 6"  (167.6 cm) IBW/kg (Calculated) : 59.3  Temp (24hrs), Avg:97.4 F (36.3 C), Min:97.1 F (36.2 C), Max:98 F (36.7 C)  Recent Labs  Lab 11/16/19 1701 11/17/19 1640 11/17/19 1641  WBC 6.3 9.0  --   CREATININE 0.90 0.95  --   LATICACIDVEN  --   --  2.2*    CrCl cannot be calculated (Unknown ideal weight.).    No Known Allergies  Antimicrobials this admission: 8/22 Vanc >>  8/22 Cefepime >>   Microbiology results: 8/22 BCx:  8/22 MRSA PCR:  Thank you for allowing pharmacy to be a part of this patient's care.  9/22, PharmD, BCPS Please see amion for complete clinical pharmacist phone list 11/18/2019 3:50 AM

## 2019-11-18 NOTE — Progress Notes (Signed)
eLink Physician-Brief Progress Note Patient Name: NOORA LOCASCIO DOB: 10/14/1925 MRN: [id number]   Date of Service  11/18/2019  HPI/Events of Note  Hypotension - BP = 83/43 with MAP = 54 s/p Fentanyl 100 mcg IV dose.   eICU Interventions  Plan: 1. Bolus with 0.9 NaCl 1 liter IV over 1 hour now.  2. Phenylephrine IV infusion via PIV. Titrate to MAP >= 65. 3. Decrease dose of Fentanyl to 25-50 mcg.      Intervention Category Major Interventions: Hypotension - evaluation and management  Dairon Procter Dennard Nip 11/18/2019, 2:31 AM

## 2019-11-18 NOTE — Progress Notes (Signed)
eLink Physician-Brief Progress Note Patient Name: Robin Moreno DOB: 1926-01-01 MRN: [id number]   Date of Service  11/18/2019  HPI/Events of Note  BP 176/91 (116) HR 110s. RN wants you to know what she can give for BP and HR  eICU Interventions  Cr normal, NPO.  Lopressor 2.5 mg IV once. Call back if not better.      Intervention Category Intermediate Interventions: Hypertension - evaluation and management  Ranee Gosselin 11/18/2019, 10:03 PM

## 2019-11-18 NOTE — Progress Notes (Signed)
Assisted tele visit to patient with son.  Versie Fleener DNP eLink RN

## 2019-11-18 NOTE — Progress Notes (Signed)
Patient transported to 94M without any complications.

## 2019-11-18 NOTE — Progress Notes (Signed)
eLink Physician-Brief Progress Note Patient Name: Robin Moreno DOB: 01-07-1926 MRN: [id number]   Date of Service  11/18/2019  HPI/Events of Note  Hypertension, was given IV metoprolol earlier HR 106, high BP has been an issue all day   eICU Interventions  Add PRN labetalol     Intervention Category Major Interventions: Hypertension - evaluation and management  Oretha Milch 11/18/2019, 11:59 PM

## 2019-11-18 NOTE — Progress Notes (Signed)
Order received for extubation.  Cuff leak positive prior to extubation.  Patient extubated to 6l [place].  No stridor noted; patient unable to speak at this time.  No distress noted; no complications noted.

## 2019-11-18 NOTE — Progress Notes (Signed)
ETT located at 25 advance 2cm in the ER.

## 2019-11-18 NOTE — Progress Notes (Signed)
NAME:  Robin Moreno, MRN:  [id number], DOB:  1925/09/13, LOS: 1 ADMISSION DATE:  11/17/2019, CONSULTATION DATE:  8/21 REFERRING MD:  EDP, CHIEF COMPLAINT:  Found down  Brief History   86 y/o female with ultiple medical problems found unresponsive in her home hospital bed with head caught between rails (neck compressed), agonal breathing.  Intubated in field and brought to our hospital.  Admission COVID 19 PCR positive.  Has history of COVID 19 in 07/2019.  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 rate 18, height 5\' 6"  (1.676 m), weight 78.3 kg, SpO2 100 %.    Vent Mode: PRVC FiO2 (%):  [50 %-100 %] 50 % Set Rate:  [16 bmp] 16 bmp Vt Set:  [470 mL] 470 mL PEEP:  [5 cmH20] 5 cmH20 Plateau Pressure:  [14 cmH20-23 cmH20] 20 cmH20   Intake/Output Summary (Last 24 hours) at 11/18/2019 11/20/2019 Last data filed at 11/18/2019 0543 Gross per 24 hour  Intake 2396.32 ml  Output 275 ml  Net 2121.32 ml   Filed Weights   11/18/19 0500  Weight: 78.3 kg    Examination:  General:  In bed on vent HENT: NCAT ETT in place PULM: CTA B, vent supported breathing CV: RRR, no mgr GI: BS+, soft, nontender MSK: normal bulk and tone Neuro: awake on vent, follows commands   Resolved Hospital Problem list     Assessment & Plan:  Acute respiratory failure due to inability to protect airway> improved Extubate this  morning NPO Aspiration precautions  Acute encephalopathy > suspect due to transient choking/strangulation but seems to have improved Monitor mental status carefully  COVID> CT count is 42; this is a persistently positive test from her infection in May, will take off of precautions No indication for treatment    Best practice:  Diet: npo until SLP evaluation Pain/Anxiety/Delirium protocol (if indicated): d/c VAP protocol (if indicated): d/c DVT prophylaxis: lovenox GI prophylaxis: d/c Glucose control: monitor Mobility: up ad lib Code Status: DNR Family Communication: updated her son at length today, he confirmed DNR status Disposition: remain in ICU, move to 43M bed  Labs   CBC: Recent Labs  Lab 11/16/19 1701 11/17/19 1635 11/17/19 1640 11/17/19 1822 11/18/19 0347  WBC 6.3  --  9.0  --  10.1  NEUTROABS 4.4  --  5.7  --   --   HGB 12.6 9.5* 9.7* 8.8* 10.7*  HCT 42.2 28.0* 31.0* 26.0* 33.9*  MCV 99.1  --  96.3  --  96.3  PLT 227  --  239  --  244    Basic Metabolic Panel: Recent Labs  Lab 11/16/19 1701 11/17/19 1635 11/17/19 1640 11/17/19 1822 11/18/19 0347  NA 141 141 138 141 142  K 5.1 3.6 4.0 3.8 4.4  CL 108  --  110  --  110  CO2 25  --  18*  --  19*  GLUCOSE 122*  --  213*  --  101*  BUN 16  --  16  --  12  CREATININE 0.90  --  0.95  --  0.80  CALCIUM 9.3  --  8.1*  --  8.5*  MG  --   --   --   --  1.9  PHOS  --   --   --   --  2.8   GFR: Estimated Creatinine Clearance: 47.4 mL/min (by C-G formula based on SCr of 0.8 mg/dL). Recent Labs  Lab 11/16/19 1701 11/17/19 1640 11/17/19 1641 11/18/19 0300 11/18/19 0347  PROCALCITON  --   --   --   --  0.12  WBC 6.3 9.0  --   --  10.1  LATICACIDVEN  --   --  2.2* 3.3*  --     Liver Function Tests: Recent Labs  Lab 11/16/19 1701 11/17/19 1640  AST 24 32  ALT 15 22  ALKPHOS 81 68  BILITOT 0.5 0.7  PROT 7.2 5.7*  ALBUMIN 3.3* 2.6*   No results for input(s): LIPASE, AMYLASE in the last 168  hours. Recent Labs  Lab 11/17/19 1641  AMMONIA 13    ABG    Component Value Date/Time   PHART 7.386 11/17/2019 1822   PCO2ART 39.4 11/17/2019 1822   PO2ART 409 (H) 11/17/2019 1822   HCO3 23.7 11/17/2019 1822   TCO2 25 11/17/2019 1822   ACIDBASEDEF 1.0 11/17/2019 1822   O2SAT 100.0 11/17/2019 1822     Coagulation Profile: No results for input(s): INR, PROTIME in the last 168 hours.  Cardiac Enzymes: No results for input(s): CKTOTAL, CKMB, CKMBINDEX, TROPONINI in the last 168 hours.  HbA1C: Hgb A1c MFr Bld  Date/Time Value Ref Range Status  11/18/2019 03:00 AM 5.6 4.8 - 5.6 % Final    Comment:    (NOTE) Pre diabetes:          5.7%-6.4%  Diabetes:              >6.4%  Glycemic control for   <7.0% adults with diabetes     CBG: Recent Labs  Lab 11/18/19 0050 11/18/19 0343  GLUCAP 139* 91     Critical care time: 30 minutes       Heber [place], MD [place] [facility] Pager: [phone] Cell: [phone] If no response, call [phone]

## 2019-11-19 LAB — GLUCOSE, CAPILLARY
Glucose-Capillary: 80 mg/dL (ref 70–99)
Glucose-Capillary: 98 mg/dL (ref 70–99)
Glucose-Capillary: 98 mg/dL (ref 70–99)
Glucose-Capillary: 106 mg/dL — ABNORMAL HIGH (ref 70–99)

## 2019-11-19 MED ORDER — ORAL CARE MOUTH RINSE
15.0000 mL | Freq: Two times a day (BID) | OROMUCOSAL | Status: DC
  Administered 2019-11-19 – 2019-11-22 (×7): 15 mL via OROMUCOSAL

## 2019-11-19 MED ORDER — DILTIAZEM HCL ER 60 MG PO CP12
60.0000 mg | ORAL_CAPSULE | Freq: Two times a day (BID) | ORAL | Status: DC
  Administered 2019-11-19: 60 mg via ORAL
  Filled 2019-11-19 (×3): qty 1

## 2019-11-19 MED ORDER — RESOURCE THICKENUP CLEAR PO POWD
ORAL | Status: DC | PRN
  Filled 2019-11-19: qty 125

## 2019-11-19 NOTE — Progress Notes (Signed)
PCCM I spoke with Dr. Caleb Popp who has agreed to pick patient up tomorrow on [facility] service Josephine Igo, DO [facility] 11/19/2019 12:12 PM

## 2019-11-19 NOTE — Evaluation (Signed)
Occupational Therapy Evaluation Patient Details Name: Robin Moreno MRN: [id number] DOB: [birth year] Today's Date: 11/19/2019    History of Present Illness 86 y/o female who was found unresponsive in her home hospital bed with head caught between rails (neck compressed), and with agonal breathing. Pt Intubated in field and brought to [facility]. Admission COVID 19 PCR positive. Pt has history of COVID 19 in 07/2019. 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 Additional PMHx includes arthritis, CVA, HTN, OA of R knee, CKD, hx of THA    Clinical Impression   This 86 y/o female presents with the above. PTA pt living at home, per chart mostly bedbound and receiving assist for ADL tasks. Pt pleasant during session, very intermittently following basic commands given max multimodal cues (suspect partly due to being [facility] as well as impaired cognition). Pt requiring two person totalA for safe completion of bed mobility - tolerating sitting EOB with brief periods of minA but often requiring up to maxA given fatigue. VSS throughout on RA. Pt will benefit from continued acute OT services, currently feel pt is at SNF level of care at time of discharge (unless family/caregiver able to provide necessary level of assist). Will continue to follow acutely.     Follow Up Recommendations  SNF;Supervision/Assistance - 24 hour;Other (comment) (unless family/caregiver able to provide necesary assist )    Equipment Recommendations  Other (comment) (hoyer)           Precautions / Restrictions Precautions Precautions: Fall Restrictions Weight Bearing Restrictions: No      Mobility Bed Mobility Overal bed mobility: Needs Assistance Bed Mobility: Sit to Supine;Supine to Sit     Supine to sit: Total assist;+2 for physical assistance;+2 for safety/equipment Sit to supine: Total assist;+2 for physical assistance;+2 for safety/equipment      Transfers                 General transfer  comment: deferred as pt to transfer units shortly per RN report     Balance Overall balance assessment: Needs assistance Sitting-balance support: Feet supported Sitting balance-Leahy Scale: Poor Sitting balance - Comments: at times able to maintain with minA but requiring up to maxA when fatigued and to maintain full upright posture/position as pt with forward flexed posture at rest  Postural control: Posterior lean                                 ADL either performed or assessed with clinical judgement   ADL Overall ADL's : Needs assistance/impaired     Grooming: Wash/dry face;Maximal assistance;Bed level Grooming Details (indicate cue type and reason): max hand over hand assist to initiate, pt then able to carry over basic task using RUE, assist to support elbow to reach higher parts of face                              Functional mobility during ADLs: Total assistance;+2 for safety/equipment;+2 for physical assistance (bed mobility) General ADL Comments: pt overall requiring totalA for ADL     Vision   Additional Comments: pt able to track towards voice/therapist, but doesn't maintain for very long     Perception     Praxis      Pertinent Vitals/Pain Pain Assessment: Faces Faces Pain Scale: No hurt Pain Intervention(s): Monitored during session     Hand Dominance Right   Extremity/Trunk  Assessment Upper Extremity Assessment Upper Extremity Assessment: Generalized weakness;LUE deficits/detail;Difficult to assess due to impaired cognition LUE Deficits / Details: LUE seems grossly weaker than RUE, increased edema noted, only with gross grip strength  LUE Coordination: decreased fine motor;decreased gross motor   Lower Extremity Assessment Lower Extremity Assessment: Defer to PT evaluation   Cervical / Trunk Assessment Cervical / Trunk Assessment: Kyphotic   Communication Communication Communication: HOH;Expressive difficulties    Cognition Arousal/Alertness: Awake/alert Behavior During Therapy: Flat affect Overall Cognitive Status: No family/caregiver present to determine baseline cognitive functioning                                 General Comments: pt able to state her first name, inconsistently following simple commands but requiring max multimodal cues to do so. difficult to fully discern between pt's distractibility, pt being HOH and pt's cognition    General Comments  VSS on RA    Exercises     Shoulder Instructions      Home Living Family/patient expects to be discharged to:: Private residence Living Arrangements: Children Available Help at Discharge: Family Type of Home: House Home Access: Ramped entrance     Home Layout: One level     Bathroom Shower/Tub: Chief Strategy Officer: Handicapped height     Home Equipment: Environmental consultant - standard;Wheelchair - manual;Hospital bed   Additional Comments: home setup obtained from previous admission      Prior Functioning/Environment Level of Independence: Needs assistance  Gait / Transfers Assistance Needed: per chart pt appears to mostly be bedbound ADL's / Homemaking Assistance Needed: assist for ADL   Comments: per previous admits, pt hasn't walked in quite some time         OT Problem List: Decreased strength;Decreased range of motion;Decreased activity tolerance;Impaired balance (sitting and/or standing);Decreased cognition;Decreased safety awareness;Decreased knowledge of use of DME or AE;Cardiopulmonary status limiting activity;Decreased knowledge of precautions;Impaired UE functional use;Decreased coordination      OT Treatment/Interventions: Self-care/ADL training;Therapeutic exercise;Energy conservation;DME and/or AE instruction;Therapeutic activities;Cognitive remediation/compensation;Balance training;Patient/family education    OT Goals(Current goals can be found in the care plan section) Acute Rehab OT  Goals Patient Stated Goal: none stated OT Goal Formulation: Patient unable to participate in goal setting Time For Goal Achievement: 12/03/19 Potential to Achieve Goals: Fair  OT Frequency: Min 2X/week   Barriers to D/C:            Co-evaluation PT/OT/SLP Co-Evaluation/Treatment: Yes Reason for Co-Treatment: Complexity of the patient's impairments (multi-system involvement);For patient/therapist safety;To address functional/ADL transfers   OT goals addressed during session: ADL's and self-care      AM-PAC OT "6 Clicks" Daily Activity     Outcome Measure Help from another person eating meals?: Total Help from another person taking care of personal grooming?: A Lot Help from another person toileting, which includes using toliet, bedpan, or urinal?: Total Help from another person bathing (including washing, rinsing, drying)?: Total Help from another person to put on and taking off regular upper body clothing?: Total Help from another person to put on and taking off regular lower body clothing?: Total 6 Click Score: 7   End of Session Nurse Communication: Mobility status;Need for lift equipment  Activity Tolerance: Patient tolerated treatment well Patient left: in bed;with call bell/phone within reach;with nursing/sitter in room  OT Visit Diagnosis: Other abnormalities of gait and mobility (R26.89);Muscle weakness (generalized) (M62.81);Other symptoms and signs involving cognitive function  Time: 2831-5176 OT Time Calculation (min): 26 min Charges:  OT General Charges $OT Visit: 1 Visit OT Evaluation $OT Eval Moderate Complexity: 1 Mod  Marcy Siren, OT Acute Rehabilitation Services Pager [phone] Office [phone]   Orlando Penner 11/19/2019, 3:51 PM

## 2019-11-19 NOTE — Progress Notes (Signed)
NAME:  Robin Moreno, MRN:  [id number], DOB:  Aug 15, 1925, LOS: 2 ADMISSION DATE:  11/17/2019, CONSULTATION DATE:  8/21 REFERRING MD:  EDP, CHIEF COMPLAINT:  Found down  Brief History   86 y/o female with ultiple medical problems found unresponsive in her home hospital bed with head caught between rails (neck compressed), agonal breathing.  Intubated in field and brought to our hospital.  Admission COVID 19 PCR positive.  Has history of COVID 19 in 07/2019.  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 Doing well this morning. Stable for transfer from the ICU   Objective   Blood pressure (!) 141/67, pulse 86, temperature 98.9 F (37.2 C), temperature source Axillary, resp. rate (!) 27, height 5\' 6"  (1.676 m), weight 79.6 kg, SpO2 96 %.        Intake/Output Summary (Last 24 hours) at 11/19/2019 1109 Last data filed at 11/19/2019 0900 Gross per 24 hour  Intake 2300.54 ml  Output 1585 ml  Net 715.54 ml   Filed Weights   11/18/19 0500 11/19/19 0314  Weight: 78.3 kg 79.6 kg    Examination:  General:  Elderly FM, resting in bed, NAD HENT: NCAT, tracking  PULM: CTAB no wheeze  CV: RRR, no mrg  GI: BS present, soft, nt nd  LE: no significant edema  Neuro: awake to voice, following commands    Resolved Hospital Problem list     Assessment & Plan:   Acute respiratory failure due to inability to protect airway> improved - extubated  - doing well, on RA  - advancing PO as tolerated    Acute encephalopathy > suspect due to transient choking/strangulation but seems to have improved - mental status slowly improving  - following commands  COVID> CT count is 42; this is a persistently positive test from her infection in May, will take off of precautions - infection prevention states can come off  - transfer planned from ICU    Best practice:  Diet: npo until SLP evaluation Pain/Anxiety/Delirium protocol (if indicated): d/c VAP protocol (if indicated): d/c DVT prophylaxis: lovenox GI prophylaxis: d/c Glucose control: monitor Mobility: up ad lib Code Status: DNR Family Communication: I called and spoke with her son this morning.  Disposition: remain in ICU, move to 108M bed  Labs   CBC: Recent Labs  Lab 11/16/19 1701 11/17/19 1635 11/17/19 1640 11/17/19 1822 11/18/19 0347  WBC 6.3  --  9.0  --  10.1  NEUTROABS 4.4  --  5.7  --   --   HGB 12.6 9.5* 9.7* 8.8* 10.7*  HCT 42.2 28.0* 31.0* 26.0* 33.9*  MCV 99.1  --  96.3  --  96.3  PLT 227  --  239  --  244    Basic Metabolic Panel: Recent Labs  Lab 11/16/19 1701 11/17/19 1635 11/17/19 1640 11/17/19 1822 11/18/19 0347  NA 141 141 138 141 142  K 5.1 3.6 4.0 3.8 4.4  CL 108  --  110  --  110  CO2 25  --  18*  --  19*  GLUCOSE 122*  --  213*  --  101*  BUN 16  --  16  --  12  CREATININE 0.90  --  0.95  --  0.80  CALCIUM 9.3  --  8.1*  --  8.5*  MG  --   --   --   --  1.9  PHOS  --   --   --   --  2.8   GFR: Estimated Creatinine Clearance: 47.7 mL/min (by C-G formula based on SCr of 0.8 mg/dL). Recent Labs  Lab 11/16/19 1701 11/17/19 1640 11/17/19 1641 11/18/19 0300 11/18/19 0347 11/18/19 1448  PROCALCITON  --   --   --   --  0.12  --   WBC 6.3 9.0  --   --  10.1  --   LATICACIDVEN  --   --  2.2* 3.3*  --  3.0*    Liver Function Tests: Recent Labs  Lab 11/16/19 1701 11/17/19 1640  AST 24 32  ALT 15 22  ALKPHOS 81 68  BILITOT 0.5 0.7  PROT 7.2 5.7*  ALBUMIN 3.3* 2.6*   No  results for input(s): LIPASE, AMYLASE in the last 168 hours. Recent Labs  Lab 11/17/19 1641  AMMONIA 13    ABG    Component Value Date/Time   PHART 7.386 11/17/2019 1822   PCO2ART 39.4 11/17/2019 1822   PO2ART 409 (H) 11/17/2019 1822   HCO3 23.7 11/17/2019 1822   TCO2 25 11/17/2019 1822   ACIDBASEDEF 1.0 11/17/2019 1822   O2SAT 100.0 11/17/2019 1822     Coagulation Profile: No results for input(s): INR, PROTIME in the last 168 hours.  Cardiac Enzymes: No results for input(s): CKTOTAL, CKMB, CKMBINDEX, TROPONINI in the last 168 hours.  HbA1C: Hgb A1c MFr Bld  Date/Time Value Ref Range Status  11/18/2019 03:00 AM 5.6 4.8 - 5.6 % Final    Comment:    (NOTE) Pre diabetes:          5.7%-6.4%  Diabetes:              >6.4%  Glycemic control for   <7.0% adults with diabetes     CBG: Recent Labs  Lab 11/18/19 1628 11/18/19 1925 11/18/19 2341 11/19/19 0321 11/19/19 0746  GLUCAP 83 90 75 80 98     Josephine Igo, DO [facility] 11/19/2019 11:09 AM

## 2019-11-19 NOTE — Evaluation (Signed)
Clinical/Bedside Swallow Evaluation Patient Details  Name: Robin Moreno MRN: [id number] Date of Birth: December 01, 1925  Today's Date: 11/19/2019 Time: SLP Start Time (ACUTE ONLY): 0912 SLP Stop Time (ACUTE ONLY): 0927 SLP Time Calculation (min) (ACUTE ONLY): 15 min  Past Medical History:  Past Medical History:  Diagnosis Date  . Arthritis   . CVA (cerebral vascular accident) (HCC)   . Degenerative disk disease   . Hypercholesterolemia   . Hypertension   . Osteoarthritis of right knee   . Renal disorder   . Stage 3a chronic kidney disease 09/01/2019   Past Surgical History:  Past Surgical History:  Procedure Laterality Date  . ABDOMINAL HYSTERECTOMY    . TOTAL HIP ARTHROPLASTY     HPI:  86 y/o female with hx of HTN, CVA, HTN, CKD, Covid + 07/2019, degenerative disk disease. Pt found unresponsive in her home hospital bed with head caught between rails (neck compressed), agonal breathing.  Intubated 8/21-8/22. Found to be COVID 19 PCR positive.  MRI senescent changes and small remote left frontal parietal cortex. BSE 08/2019 with + s/s aspiration with thin via straw and prolonged mastication. SLP recommended Dys 3 thin, no straws.    Assessment / Plan / Recommendation Clinical Impression  Pt has remote left brain CVA with decreased labial closure and oral control. Intermittently follows commands impacted by likely combination of cognitive impairments (premorbid or worsened with hypoxic event?) and hard of hearing. Pt has a baseline dysphagia noted 08/2019, coughing with straw sips thin during BSE 08/2019. Integrity of laryngeal closure questionable due with immediate cough after cup sips thin and throat clears with nectar. She is endentulous without dentures at bedside. Recommned Dys 1 (puree) texture and more conservative recommendation of honey thick liquids and prognosis good for upgrade.      SLP Visit Diagnosis: Dysphagia, unspecified (R13.10)    Aspiration Risk  Mild aspiration risk     Diet Recommendation Dysphagia 1 (Puree);Honey-thick liquid   Liquid Administration via: Cup Medication Administration: Crushed with puree Supervision: Staff to assist with self feeding;Full supervision/cueing for compensatory strategies Compensations: Slow rate;Small sips/bites;Clear throat intermittently Postural Changes: Seated upright at 90 degrees    Other  Recommendations Oral Care Recommendations: Oral care BID Other Recommendations: Order thickener from pharmacy   Follow up Recommendations Other (comment) (TBD)      Frequency and Duration min 2x/week  2 weeks       Prognosis Prognosis for Safe Diet Advancement:  (fair-good) Barriers to Reach Goals: Cognitive deficits      Swallow Study   General HPI: 86 y/o female with hx of HTN, CVA, HTN, CKD, Covid + 07/2019, degenerative disk disease. Pt found unresponsive in her home hospital bed with head caught between rails (neck compressed), agonal breathing.  Intubated 8/21-8/22. Found to be COVID 19 PCR positive.  MRI senescent changes and small remote left frontal parietal cortex. BSE 08/2019 with + s/s aspiration with thin via straw and prolonged mastication. SLP recommended Dys 3 thin, no straws.  Type of Study: Bedside Swallow Evaluation Previous Swallow Assessment:  (see HPI) Diet Prior to this Study: NPO Temperature Spikes Noted: Yes Respiratory Status: Nasal cannula History of Recent Intubation: Yes Length of Intubations (days):  (1.5 days) Date extubated: 11/18/19 Behavior/Cognition: Alert;Cooperative;Requires cueing;Pleasant mood;Confused Oral Cavity Assessment: Within Functional Limits Oral Care Completed by SLP: No Oral Cavity - Dentition: Edentulous Vision: Functional for self-feeding Self-Feeding Abilities: Needs assist Patient Positioning: Upright in bed Baseline Vocal Quality: Normal Volitional Cough: Cognitively unable to elicit Volitional  Swallow: Unable to elicit    Oral/Motor/Sensory Function Overall  Oral Motor/Sensory Function: Mild impairment Facial ROM: Reduced right Facial Symmetry: Abnormal symmetry right Facial Strength: Reduced right   Ice Chips Ice chips: Not tested   Thin Liquid Thin Liquid: Impaired Presentation: Cup Oral Phase Impairments: Reduced labial seal Oral Phase Functional Implications: Right anterior spillage Pharyngeal  Phase Impairments: Cough - Immediate    Nectar Thick Nectar Thick Liquid: Impaired Presentation: Cup;Straw Oral Phase Impairments: Reduced labial seal Oral phase functional implications: Right anterior spillage Pharyngeal Phase Impairments: Throat Clearing - Delayed   Honey Thick Honey Thick Liquid: Not tested   Puree Puree: Within functional limits   Solid     Solid: Not tested      [name] 11/19/2019,9:54 AM  [name] [name] Face.Ed Nurse, children's [phone] Office [phone]

## 2019-11-19 NOTE — Evaluation (Signed)
Physical Therapy Evaluation Patient Details Name: Robin Moreno MRN: [id number] DOB: 07-17-1925 Today's Date: 11/19/2019   History of Present Illness  86 y/o female who was found unresponsive in her home hospital bed with head caught between rails (neck compressed), and with agonal breathing. Pt Intubated in field and brought to [facility]. Admission COVID 19 PCR positive. Pt has history of COVID 19 in 07/2019. 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 Additional PMHx includes arthritis, CVA, HTN, OA of R knee, CKD, hx of THA   Clinical Impression  Pt admitted with/for  Unresponsiveness, weakness and COVID +.  Pt needing total assist of 2 persons for basic mobility at this time and is inconsistent with responding to any commands/cues..  Pt currently limited functionally due to the problems listed. ( See problems list.)   Pt will benefit from PT to maximize function and safety in order to get ready for next venue listed below.     Follow Up Recommendations SNF;Supervision/Assistance - 24 hour;Other (comment) (Unless family can provide 24 hour assist)    Equipment Recommendations  Other (comment) (TBA)    Recommendations for Other Services       Precautions / Restrictions Precautions Precautions: Fall      Mobility  Bed Mobility Overal bed mobility: Needs Assistance Bed Mobility: Sit to Supine;Supine to Sit     Supine to sit: Total assist;+2 for physical assistance;+2 for safety/equipment Sit to supine: Total assist;+2 for physical assistance;+2 for safety/equipment      Transfers                 General transfer comment: deferred as pt to transfer units shortly per RN report   Ambulation/Gait             General Gait Details: not able  Stairs            Wheelchair Mobility    Modified Rankin (Stroke Patients Only)       Balance Overall balance assessment: Needs assistance Sitting-balance support: Feet supported Sitting balance-Leahy  Scale: Poor Sitting balance - Comments: at times able to maintain with minA but requiring up to maxA when fatigued and to maintain full upright posture/position as pt with forward flexed posture at rest  Postural control: Posterior lean                                   Pertinent Vitals/Pain Pain Assessment: Faces Faces Pain Scale: No hurt Pain Intervention(s): Monitored during session;Repositioned    Home Living Family/patient expects to be discharged to:: Private residence Living Arrangements: Children Available Help at Discharge: Family Type of Home: House Home Access: Ramped entrance     Home Layout: One level Home Equipment: Walker - standard;Wheelchair - manual;Hospital bed Additional Comments: home setup obtained from previous admission    Prior Function Level of Independence: Needs assistance   Gait / Transfers Assistance Needed: per chart pt appears to mostly be bedbound  ADL's / Homemaking Assistance Needed: assist for ADL  Comments: per previous admits, pt hasn't walked in quite some time      Hand Dominance   Dominant Hand: Right    Extremity/Trunk Assessment   Upper Extremity Assessment Upper Extremity Assessment: Generalized weakness    Lower Extremity Assessment Lower Extremity Assessment: Generalized weakness    Cervical / Trunk Assessment Cervical / Trunk Assessment: Kyphotic  Communication   Communication: HOH;Expressive difficulties  Cognition Arousal/Alertness: Awake/alert Behavior  During Therapy: Flat affect Overall Cognitive Status: No family/caregiver present to determine baseline cognitive functioning                                 General Comments: pt able to state her first name, inconsistently following simple commands but requiring max multimodal cues to do so. difficult to fully discern between pt's distractibility, pt being HOH and pt's cognition       General Comments General comments (skin  integrity, edema, etc.): vss on RA    Exercises     Assessment/Plan    PT Assessment Patient needs continued PT services  PT Problem List Decreased strength;Decreased activity tolerance;Decreased balance;Decreased mobility;Decreased coordination;Decreased knowledge of use of DME;Cardiopulmonary status limiting activity       PT Treatment Interventions DME instruction;Gait training;Functional mobility training;Therapeutic activities;Balance training;Patient/family education;Neuromuscular re-education    PT Goals (Current goals can be found in the Care Plan section)  Acute Rehab PT Goals Patient Stated Goal: none stated PT Goal Formulation: Patient unable to participate in goal setting Time For Goal Achievement: 12/03/19 Potential to Achieve Goals: Fair    Frequency Min 2X/week   Barriers to discharge        Co-evaluation PT/OT/SLP Co-Evaluation/Treatment: Yes Reason for Co-Treatment: Complexity of the patient's impairments (multi-system involvement) PT goals addressed during session: Mobility/safety with mobility OT goals addressed during session: ADL's and self-care       AM-PAC PT "6 Clicks" Mobility  Outcome Measure Help needed turning from your back to your side while in a flat bed without using bedrails?: Total Help needed moving from lying on your back to sitting on the side of a flat bed without using bedrails?: Total Help needed moving to and from a bed to a chair (including a wheelchair)?: Total Help needed standing up from a chair using your arms (e.g., wheelchair or bedside chair)?: Total Help needed to walk in hospital room?: Total Help needed climbing 3-5 steps with a railing? : Total 6 Click Score: 6    End of Session   Activity Tolerance: Patient tolerated treatment well;Patient limited by fatigue Patient left: in bed;with call bell/phone within reach Nurse Communication: Mobility status;Need for lift equipment PT Visit Diagnosis: Other abnormalities of  gait and mobility (R26.89);Muscle weakness (generalized) (M62.81);Other symptoms and signs involving the nervous system (R29.898);Adult, failure to thrive (R62.7)    Time: 1139-1205 PT Time Calculation (min) (ACUTE ONLY): 26 min   Charges:   PT Evaluation $PT Eval Moderate Complexity: 1 Mod          11/19/2019  Jacinto Halim., PT Acute Rehabilitation Services [phone]  (pager) [phone]  (office)  Eliseo Gum Cian Costanzo 11/19/2019, 7:47 PM

## 2019-11-20 ENCOUNTER — Inpatient Hospital Stay (HOSPITAL_COMMUNITY): Payer: Medicare Other

## 2019-11-20 ENCOUNTER — Encounter (HOSPITAL_COMMUNITY): Payer: Self-pay | Admitting: Pulmonary Disease

## 2019-11-20 ENCOUNTER — Other Ambulatory Visit: Payer: Self-pay

## 2019-11-20 DIAGNOSIS — I2699 Other pulmonary embolism without acute cor pulmonale: Secondary | ICD-10-CM

## 2019-11-20 MED ORDER — SODIUM CHLORIDE 0.9 % IV SOLN: INTRAVENOUS | Status: DC

## 2019-11-20 MED ORDER — DILTIAZEM HCL 60 MG PO TABS
90.0000 mg | ORAL_TABLET | Freq: Three times a day (TID) | ORAL | Status: DC
  Administered 2019-11-20 – 2019-11-22 (×6): 90 mg via ORAL
  Filled 2019-11-20 (×6): qty 2

## 2019-11-20 MED ORDER — ROSUVASTATIN CALCIUM 5 MG PO TABS
10.0000 mg | ORAL_TABLET | Freq: Every day | ORAL | Status: DC
  Administered 2019-11-20 – 2019-11-22 (×3): 10 mg via ORAL
  Filled 2019-11-20 (×3): qty 2

## 2019-11-20 MED ORDER — LACTATED RINGERS IV BOLUS
500.0000 mL | Freq: Once | INTRAVENOUS | Status: AC
  Administered 2019-11-20: 500 mL via INTRAVENOUS

## 2019-11-20 MED ORDER — IOHEXOL 350 MG/ML SOLN
60.0000 mL | Freq: Once | INTRAVENOUS | Status: AC | PRN
  Administered 2019-11-20: 60 mL via INTRAVENOUS

## 2019-11-20 MED ORDER — PANTOPRAZOLE SODIUM 40 MG PO TBEC
40.0000 mg | DELAYED_RELEASE_TABLET | Freq: Every day | ORAL | Status: DC
  Administered 2019-11-20 – 2019-11-22 (×3): 40 mg via ORAL
  Filled 2019-11-20 (×3): qty 1

## 2019-11-20 MED ORDER — DILTIAZEM HCL ER 90 MG PO CP12
90.0000 mg | ORAL_CAPSULE | Freq: Two times a day (BID) | ORAL | Status: DC
  Administered 2019-11-20: 90 mg via ORAL
  Filled 2019-11-20 (×3): qty 1

## 2019-11-20 MED ORDER — METOPROLOL TARTRATE 5 MG/5ML IV SOLN
5.0000 mg | Freq: Three times a day (TID) | INTRAVENOUS | Status: DC | PRN
  Administered 2019-11-20: 5 mg via INTRAVENOUS
  Filled 2019-11-20: qty 5

## 2019-11-20 MED ORDER — DIGOXIN 0.25 MG/ML IJ SOLN
0.2500 mg | Freq: Four times a day (QID) | INTRAMUSCULAR | Status: DC
  Administered 2019-11-20: 0.25 mg via INTRAVENOUS
  Filled 2019-11-20: qty 2

## 2019-11-20 MED ORDER — ENOXAPARIN SODIUM 80 MG/0.8ML ~~LOC~~ SOLN
80.0000 mg | Freq: Two times a day (BID) | SUBCUTANEOUS | Status: DC
  Administered 2019-11-20 – 2019-11-21 (×2): 80 mg via SUBCUTANEOUS
  Filled 2019-11-20 (×2): qty 0.8

## 2019-11-20 MED ORDER — ENSURE ENLIVE PO LIQD
237.0000 mL | Freq: Two times a day (BID) | ORAL | Status: DC
  Administered 2019-11-21 – 2019-11-22 (×4): 237 mL via ORAL

## 2019-11-20 NOTE — Care Management (Signed)
    Durable Medical Equipment  (From admission, onward)         Start     Ordered   11/20/19 1336  For home use only DME Hospital bed  Once       Question Answer Comment  Length of Need 6 Months   Patient has (list medical condition): weakness, stroke   The above medical condition requires: Patient requires the ability to reposition frequently   Head must be elevated greater than: 30 degrees   Bed type Semi-electric   Hoyer Lift Yes   Support Surface: Gel Overlay      11/20/19 1336

## 2019-11-20 NOTE — Care Management (Signed)
Spoke w patient's son Genevie Ann, confirmed need for hospital bed and hoyer. Order and note placed, referral made to [facility].

## 2019-11-20 NOTE — Progress Notes (Signed)
Received page from radiologist regarding CTA which was a very limited study but concerning for likely small PEs on right without any CT-evidence for heart-strain. Patient is hemodynamically stable and is already on Lovenox 1mg /kg q12h, will continue current plan.

## 2019-11-20 NOTE — TOC Initial Note (Signed)
Transition of Care [facility]) - Initial/Assessment Note    Patient Details  Name: Robin Moreno MRN: [id number] Date of Birth: 07/09/1925  [facility]) CM/SW Contact:    Mearl Latin, LCSW Phone Number: 11/20/2019, 12:57 PM  Clinical Narrative:                 CSW received consult for possible home health services at time of discharge. CSW spoke with patient's son regarding PT recommendation of SNF at time of discharge. Patient's son reported that he would like home health services for patient rather than SNF as she resides at home with him. He stated that he believes patient is active with [facility]. CSW will verify. CSW discussed equipment needs with patient and he requested a hospital bed (though he reported the hospital has tried to arrange it before and he declined it at the last minute) and possibly a hoyer lift though he does not know how to use one. CSW will follow up on DME. CSW confirmed PCP and address. Patient's son requests [facility]AR transport patient home at discharge. No further questions reported at this time.    Expected Discharge Plan: Home w Home Health Services Barriers to Discharge: Continued Medical Work up   Patient Goals and CMS Choice Patient states their goals for this hospitalization and ongoing recovery are:: Return home CMS Medicare.gov Compare Post Acute Care list provided to:: Patient Represenative (must comment) (Son) Choice offered to / list presented to : Adult Children  Expected Discharge Plan and Services Expected Discharge Plan: Home w Home Health Services In-house Referral: Clinical Social Work Discharge Planning Services: CM Consult Post Acute Care Choice: Home Health, Durable Medical Equipment Living arrangements for the past 2 months: Single Family Home                                      Prior Living Arrangements/Services Living arrangements for the past 2 months: Single Family Home Lives with:: Adult  Children Patient language and need for interpreter reviewed:: Yes Do you feel safe going back to the place where you live?: Yes      Need for Family Participation in Patient Care: Yes (Comment) Care giver support system in place?: Yes (comment) Current home services: Home PT Criminal Activity/Legal Involvement Pertinent to Current Situation/Hospitalization: No - Comment as needed  Activities of Daily Living Home Assistive Devices/Equipment: None (unsure) ADL Screening (condition at time of admission) Patient's cognitive ability adequate to safely complete daily activities?: No Is the patient deaf or have difficulty hearing?: Yes (HH right ear) Does the patient have difficulty seeing, even when wearing glasses/contacts?: No Does the patient have difficulty concentrating, remembering, or making decisions?: Yes Patient able to express need for assistance with ADLs?: No Does the patient have difficulty dressing or bathing?: Yes Independently performs ADLs?: No Communication: Independent Dressing (OT): Dependent Is this a change from baseline?: Pre-admission baseline Grooming: Dependent Is this a change from baseline?: Pre-admission baseline Feeding: Needs assistance Is this a change from baseline?: Pre-admission baseline Bathing: Dependent Is this a change from baseline?: Pre-admission baseline Toileting: Dependent Is this a change from baseline?: Pre-admission baseline In/Out Bed: Dependent Is this a change from baseline?: Pre-admission baseline Walks in Home: Dependent Is this a change from baseline?: Pre-admission baseline Does the patient have difficulty walking or climbing stairs?: Yes Weakness of Legs: Both Weakness of Arms/Hands: Right  Permission Sought/Granted Permission sought  to share information with : Facility Medical sales representative, Family Supports Permission granted to share information with : Yes, Verbal Permission Granted  Share Information with NAME:  Daryle  Permission granted to share info w AGENCY: Home Health  Permission granted to share info w Relationship: Son  Permission granted to share info w Contact Information: [phone]  Emotional Assessment   Attitude/Demeanor/Rapport: Unable to Assess Affect (typically observed): Unable to Assess Orientation: : Oriented to Self Alcohol / Substance Use: Not Applicable Psych Involvement: No (comment)  Admission diagnosis:  Respiratory arrest (HCC) [R09.2] Respiratory distress [R06.03] Hypotension, unspecified hypotension type [I95.9] Altered mental status, unspecified altered mental status type [R41.82] COVID-19 [U07.1] Patient Active Problem List   Diagnosis Date Noted   Pressure injury of skin 11/18/2019   Respiratory distress 11/17/2019   Altered mental status    Respiratory arrest (HCC)    Facial twitching 09/01/2019   Stage 3a chronic kidney disease 09/01/2019   DNR (do not resuscitate) 09/01/2019   COVID-19 07/30/2019   Essential hypertension 07/30/2019   Dyslipidemia 07/30/2019   Pre-syncope 07/30/2019   Viral gastroenteritis due to [place] agent 09/19/2018   Acute kidney injury superimposed on CKD (HCC) 09/18/2018   Diarrhea 09/18/2018   Urinary tract infection 09/18/2018   Generalized weakness 09/18/2018   Arthritis 09/18/2018   PCP:  Ralene Ok, MD Pharmacy:   [facility] [address] [place] - [address] [place] [address] Phone: [phone] Fax: [phone]  [id number] - [place], [place] - [address] AT [facility] [address] [place] [address] Phone: [phone] Fax: [phone]  [id number] [place],  - [address] AT [facility] [address]  [place] [address] Phone: [phone] Fax: [phone]     Social Determinants of Health (SDOH) Interventions    Readmission Risk Interventions No  flowsheet data found.

## 2019-11-20 NOTE — Progress Notes (Signed)
Initial Nutrition Assessment  DOCUMENTATION CODES:   Not applicable  INTERVENTION:  Provide Ensure Enlive po BID (thickened to appropriate consistency), each supplement provides 350 kcal and 20 grams of protein  Encourage adequate PO intake.   NUTRITION DIAGNOSIS:   Increased nutrient needs related to wound healing as evidenced by estimated needs.  GOAL:   Patient will meet greater than or equal to 90% of their needs  MONITOR:   PO intake, Supplement acceptance, Diet advancement, Skin, Weight trends, Labs, I & O's  REASON FOR ASSESSMENT:   Malnutrition Screening Tool    ASSESSMENT:   86 year old female with history of CKD3, HTN, DJD presents after fall, AMS with agonal breathing and intubated in field. Pt with history of COVID 07/2019. Admission COVID-19 positive.  Pt extubated 8/22. Pt currently on a dysphagia 1 diet with honey thick liquids. Noted pt edentulous without dentures at bedside. SLP continues to follow for liquids upgrade. Per MD, pt confused with mild delirium with hospital acquired metabolic encephalopathy. RD unable to obtain most recent nutrition history at this time. Pt with no significant weight loss per weight records. Meal completion 75% at breakfast this AM. RD to order nutritional supplements to aid in caloric and protein needs. Unable to complete Nutrition-Focused physical exam at this time.   Labs and medications reviewed.   Diet Order:   Diet Order            DIET - DYS 1 Room service appropriate? No; Fluid consistency: Honey Thick  Diet effective now                 EDUCATION NEEDS:   Not appropriate for education at this time  Skin:  Skin Assessment: Skin Integrity Issues: Skin Integrity Issues:: Stage II, DTI DTI: R and L heel Stage II: Sacrum  Last BM:  8/22  Height:   Ht Readings from Last 1 Encounters:  11/17/19 5\' 6"  (1.676 m)    Weight:   Wt Readings from Last 1 Encounters:  11/20/19 83.7 kg   BMI:  Body mass index  is 29.78 kg/m.  Estimated Nutritional Needs:   Kcal:  1450-1650  Protein:  70-85 grams  Fluid:  >/= 1.5 L/day  11/22/19, MS, RD, LDN RD pager number/after hours weekend pager number on Amion.

## 2019-11-20 NOTE — Progress Notes (Addendum)
PROGRESS NOTE                                                                                                                                                                                                             Patient Demographics:    Robin Moreno, is a 86 y.o. female, DOB - 1925-04-28, ZES:[id number]  Outpatient Primary MD for the patient is Robin Ok, MD    LOS - 3  Admit date - 11/17/2019    Chief Complaint  Patient presents with   Altered Mental Status       Brief Narrative -  86year-old African-American female who lives at home with her son with underlying history of DJD, hypertension, dyslipidemia, CKD stage IIIa was brought to the ER because she was found fallen down in her house with her head caught between wall and bedrails unresponsive with agonal breathing.  She was brought to the ER intubated and admitted under ICU.  She has history of COVID-19 which she acquired several months ago and was incidentally COVID-19 positive this admission with a cycle count of over 42 requiring no isolation.  He was stabilized by critical care and transferred to my care on 11/20/2019.   Subjective:    Robin Moreno today in bed but quite confused, denies any headache chest or abdominal pain.  Unreliable historian.   Assessment  & Plan :     1. Unresponsive state at home with agonal breathing requiring endotracheal intubation for airway protection, extubated within 24 hours by PCCM, unclear reason for unresponsive state, CT head and MRI brain nonacute, was stable on telemetry, currently no focal deficits but quite encephalopathic.  Likely has underlying dementia, once improved will check orthostatics, she is not a candidate for aggressive invasive testing and interventions.  Continue supportive care, PT OT evaluation and monitoring.  Had detailed discussions with patient's son he understands that his mother is quite old and frail  but would like to take her home when she is better.      2.  Resolved active COVID-19 infection from several months ago.  Incidental Covid positive, cycle count over 42 no isolation needed.  Per critical care note she was cleared by infection prevention.  3.  Underlying mild delirium with hospital-acquired metabolic encephalopathy.  Supportive care, minimize narcotics and benzodiazepines.  4. Essential hypertension.  Medications adjusted  increased dose of calcium channel blocker with as needed Lopressor added.  5.  Dyslipidemia.  On statin at home dose.  6.  Patient has developed sinus tachycardia around 3 PM, heart rate around 120s, also D-dimer came back high today.  Will check leg ultrasound, CT angiogram chest, TSH, full dose Lovenox for now, hydrate, get a EKG and monitor closely.   Addendum at 3:40 PM EKG shows new onset A. fib.  Could be paroxysmal A. fib, [place] vas 2 score will be greater than 3.  For now full dose Lovenox, Cardizem dose adjusted.  Will check TSH and echocardiogram.     Condition -  Guarded  Family Communication  : Robin Moreno [phone] on 11/20/2019  Code Status :  DNR  Consults  :  PCCM  Procedures  :    CT Head and MRI Brain - No acute changes, remote old CVA.  CTA chest -  PUD Prophylaxis : PPI  Disposition Plan  :    Status is: Inpatient  Remains inpatient appropriate because:IV treatments appropriate due to intensity of illness or inability to take PO   Dispo: The patient is from: Home              Anticipated d/c is to: Home              Anticipated d/c date is: > 3 days              Patient currently is not medically stable to d/c.   DVT Prophylaxis  :  Lovenox    Lab Results  Component Value Date   PLT 244 11/18/2019    Diet :  Diet Order            DIET - DYS 1 Room service appropriate? No; Fluid consistency: Honey Thick  Diet effective now                  Inpatient Medications  Scheduled Meds:  aspirin  81 mg  Oral Daily   Chlorhexidine Gluconate Cloth  6 each Topical Daily   diltiazem  60 mg Oral Q12H   enoxaparin (LOVENOX) injection  40 mg Subcutaneous Q24H   mouth rinse  15 mL Mouth Rinse BID   Continuous Infusions:  sodium chloride     PRN Meds:.docusate sodium, labetalol, polyethylene glycol, Resource ThickenUp Clear  Antibiotics  :    Anti-infectives (From admission, onward)   Start     Dose/Rate Route Frequency Ordered Stop   11/19/19 0600  vancomycin (VANCOCIN) IVPB 1000 mg/200 mL premix  Status:  Discontinued        1,000 mg 200 mL/hr over 60 Minutes Intravenous Every 24 hours 11/18/19 0356 11/19/19 1211   11/18/19 0400  ceFEPIme (MAXIPIME) 2 g in sodium chloride 0.9 % 100 mL IVPB  Status:  Discontinued        2 g 200 mL/hr over 30 Minutes Intravenous Every 12 hours 11/18/19 0356 11/19/19 1211   11/18/19 0400  vancomycin (VANCOREADY) IVPB 1500 mg/300 mL        1,500 mg 150 mL/hr over 120 Minutes Intravenous  Once 11/18/19 0356 11/18/19 0950       Time Spent in minutes  30   Susa Raring M.D on 11/20/2019 at 9:17 AM  To page go to [email or web address] - password [facility]  [facility] -  Office  [phone]    See all Orders from today for further details    Objective:   Vitals:   11/19/19 2300  11/20/19 0052 11/20/19 0439 11/20/19 0813  BP: 101/65  (!) 171/78   Pulse: 85  92   Resp: (!) 21  20   Temp:  98 F (36.7 C) 98.3 F (36.8 C) 98.6 F (37 C)  TempSrc:  Oral Oral Axillary  SpO2: 95%  93%   Weight:  83.7 kg    Height:        Wt Readings from Last 3 Encounters:  11/20/19 83.7 kg  09/01/19 70 kg  09/29/18 64.9 kg     Intake/Output Summary (Last 24 hours) at 11/20/2019 0917 Last data filed at 11/20/2019 0651 Gross per 24 hour  Intake 1932.53 ml  Output 910 ml  Net 1022.53 ml     Physical Exam  Awake, mildly confused, No new F.N deficits,   [place].AT,PERRAL Supple Neck,No JVD, No cervical lymphadenopathy appriciated.  Symmetrical Chest wall  movement, Good air movement bilaterally, few rales RRR,No Gallops,Rubs or new Murmurs, No Parasternal Heave +ve B.Sounds, Abd Soft, No tenderness, No organomegaly appriciated, No rebound - guarding or rigidity. No Cyanosis, Clubbing or edema, No new Rash or bruise       Data Review:    CBC Recent Labs  Lab 11/16/19 1701 11/17/19 1635 11/17/19 1640 11/17/19 1822 11/18/19 0347  WBC 6.3  --  9.0  --  10.1  HGB 12.6 9.5* 9.7* 8.8* 10.7*  HCT 42.2 28.0* 31.0* 26.0* 33.9*  PLT 227  --  239  --  244  MCV 99.1  --  96.3  --  96.3  MCH 29.6  --  30.1  --  30.4  MCHC 29.9*  --  31.3  --  31.6  RDW 13.2  --  13.2  --  13.2  LYMPHSABS 1.3  --  2.3  --   --   MONOABS 0.4  --  0.7  --   --   EOSABS 0.1  --  0.2  --   --   BASOSABS 0.0  --  0.0  --   --     Recent Labs  Lab 11/16/19 1701 11/17/19 1635 11/17/19 1640 11/17/19 1641 11/17/19 1822 11/18/19 0300 11/18/19 0347 11/18/19 0940 11/18/19 1448  NA 141 141 138  --  141  --  142  --   --   K 5.1 3.6 4.0  --  3.8  --  4.4  --   --   CL 108  --  110  --   --   --  110  --   --   CO2 25  --  18*  --   --   --  19*  --   --   GLUCOSE 122*  --  213*  --   --   --  101*  --   --   BUN 16  --  16  --   --   --  12  --   --   CREATININE 0.90  --  0.95  --   --   --  0.80  --   --   CALCIUM 9.3  --  8.1*  --   --   --  8.5*  --   --   AST 24  --  32  --   --   --   --   --   --   ALT 15  --  22  --   --   --   --   --   --  ALKPHOS 81  --  68  --   --   --   --   --   --   BILITOT 0.5  --  0.7  --   --   --   --   --   --   ALBUMIN 3.3*  --  2.6*  --   --   --   --   --   --   MG  --   --   --   --   --   --  1.9  --   --   CRP  --   --   --   --   --   --  10.1*  --   --   DDIMER  --   --   --   --   --   --   --  4.56*  --   PROCALCITON  --   --   --   --   --   --  0.12  --   --   LATICACIDVEN  --   --   --  2.2*  --  3.3*  --   --  3.0*  HGBA1C  --   --   --   --   --  5.6  --   --   --   AMMONIA  --   --   --  13  --   --    --   --   --     ------------------------------------------------------------------------------------------------------------------ No results for input(s): CHOL, HDL, LDLCALC, TRIG, CHOLHDL, LDLDIRECT in the last 72 hours.  Lab Results  Component Value Date   HGBA1C 5.6 11/18/2019   ------------------------------------------------------------------------------------------------------------------ No results for input(s): TSH, T4TOTAL, T3FREE, THYROIDAB in the last 72 hours.  Invalid input(s): FREET3  Cardiac Enzymes No results for input(s): CKMB, TROPONINI, MYOGLOBIN in the last 168 hours.  Invalid input(s): CK ------------------------------------------------------------------------------------------------------------------    Component Value Date/Time   BNP 30.9 07/30/2019 2255    Micro Results Recent Results (from the past 240 hour(s))  SARS Coronavirus 2 by RT PCR (hospital order, performed in [facility] hospital lab) Nasopharyngeal Nasopharyngeal Swab     Status: Abnormal   Collection Time: 11/16/19  5:02 PM   Specimen: Nasopharyngeal Swab  Result Value Ref Range Status   SARS Coronavirus 2 POSITIVE (A) NEGATIVE Final    Comment: RESULT CALLED TO, READ BACK BY AND VERIFIED WITH: L MEEKS RN 11/16/19 AT 1907 SK (NOTE) SARS-CoV-2 target nucleic acids are DETECTED  SARS-CoV-2 RNA is generally detectable in upper respiratory specimens  during the acute phase of infection.  Positive results are indicative  of the presence of the identified virus, but do not rule out bacterial infection or co-infection with other pathogens not detected by the test.  Clinical correlation with patient history and  other diagnostic information is necessary to determine patient infection status.  The expected result is negative.  Fact Sheet for Patients:   BoilerBrush.com.cy   Fact Sheet for Healthcare Providers:   https://pope.com/    This  test is not yet approved or cleared by the [place] FDA and  has been authorized for detection and/or diagnosis of SARS-CoV-2 by FDA under an Emergency Use Authorization (EUA).  This EUA will remain in effect (meaning this test ca n be used) for the duration of  the COVID-19 declaration under Section 564(b)(1) of the Act, 21 U.S.C. section 360-bbb-3(b)(1), unless the authorization is terminated or revoked sooner.  Performed at [facility], [address] [place], [place] [address]   MRSA PCR Screening     Status: None   Collection Time: 11/18/19  1:01 AM   Specimen: Urine, Catheterized; Nasopharyngeal  Result Value Ref Range Status   MRSA by PCR NEGATIVE NEGATIVE Final    Comment:        The GeneXpert MRSA Assay (FDA approved for NASAL specimens only), is one component of a comprehensive MRSA colonization surveillance program. It is not intended to diagnose MRSA infection nor to guide or monitor treatment for MRSA infections. Performed at [facility], [address], [place], [place] [address]   Culture, blood (routine x 2)     Status: None (Preliminary result)   Collection Time: 11/18/19 10:29 AM   Specimen: BLOOD  Result Value Ref Range Status   Specimen Description BLOOD RIGHT ANTECUBITAL  Final   Special Requests   Final    BOTTLES DRAWN AEROBIC AND ANAEROBIC Blood Culture results may not be optimal due to an inadequate volume of blood received in culture bottles   Culture   Final    NO GROWTH 2 DAYS Performed at [facility], [address], [place], [place] [address]    Report Status PENDING  Incomplete  Culture, blood (routine x 2)     Status: None (Preliminary result)   Collection Time: 11/18/19 10:29 AM   Specimen: BLOOD RIGHT HAND  Result Value Ref Range Status   Specimen Description BLOOD RIGHT HAND  Final   Special Requests AEROBIC BOTTLE ONLY Blood Culture adequate volume  Final   Culture   Final    NO GROWTH 2 DAYS Performed at  [facility], [address], [place], [place] [address]    Report Status PENDING  Incomplete    Radiology Reports CT HEAD WO CONTRAST  Result Date: 11/17/2019 CLINICAL DATA:  Altered level of consciousness, trauma, unresponsive, found down EXAM: CT HEAD WITHOUT CONTRAST CT CERVICAL SPINE WITHOUT CONTRAST TECHNIQUE: Multidetector CT imaging of the head and cervical spine was performed following the standard protocol without intravenous contrast. Multiplanar CT image reconstructions of the cervical spine were also generated. COMPARISON:  08/31/2019 FINDINGS: CT HEAD FINDINGS Brain: Hypodensity left parietal cortex consistent with chronic infarct, stable. No signs of acute infarct or hemorrhage. Lateral ventricles and midline structures are unremarkable. No acute extra-axial fluid collections. No mass effect. Vascular: No hyperdense vessel or unexpected calcification. Skull: Normal. Negative for fracture or focal lesion. Sinuses/Orbits: No acute finding. Other: None. CT CERVICAL SPINE FINDINGS Alignment: Alignment is anatomic. Skull base and vertebrae: No acute displaced fracture. Soft tissues and spinal canal: Patient is intubated, endotracheal tube at level of thoracic inlet. Enteric catheter is identified, distal margin excluded by slice selection. Prevertebral soft tissues are unremarkable. No visible canal hematoma. Disc levels: Stable diffuse facet hypertrophy. Mild spondylosis at C3-4 and C6-7. There is mild diffuse neural foraminal encroachment related to facet hypertrophic change. Findings are stable. Upper chest: Lung apices are clear. Other: Reconstructed images demonstrate no additional findings. IMPRESSION: 1. No acute intracranial process. 2. Stable chronic left parietal infarct. 3. No acute cervical spine fracture. Electronically Signed   By: Sharlet Salina M.D.   On: 11/17/2019 18:36   CT Angio Chest PE W and/or Wo Contrast  Result Date: 11/17/2019 CLINICAL DATA:  Unresponsive, axonal  breathing. EXAM: CT ANGIOGRAPHY CHEST WITH CONTRAST TECHNIQUE: Multidetector CT imaging of the chest was performed using the standard protocol during bolus administration  of intravenous contrast. Multiplanar CT image reconstructions and MIPs were obtained to evaluate the vascular anatomy. CONTRAST:  58mL OMNIPAQUE IOHEXOL 350 MG/ML SOLN COMPARISON:  Chest x-ray same date., remote chest CT from 2005 FINDINGS: Cardiovascular: Calcified atheromatous plaque in the thoracic aorta. Calcifications of the aortic valve. Mitral annular calcifications. No aneurysmal dilation of the thoracic aorta. Limited assessment of the aorta due to bolus timing. Contour is smooth. No pericardial effusion. Heart size is normal. Central pulmonary vascular caliber is normal. Study quality is good. No pulmonary embolism. Mediastinum/Nodes: Endotracheal tube in place terminating just at the thoracic inlet. Balloon inflation just below the larynx. This was previously communicated. Gastric tube passes through the esophagus into the stomach. No adenopathy at the thoracic inlet. No mediastinal adenopathy. No hilar adenopathy. Lungs/Pleura: Basilar airspace disease. No filling defects or fluid in bronchi. No pneumothorax. No lobar consolidation. No pleural effusion. Upper Abdomen: Gallbladder is distended, moderately with gallstones in the dependent portion. No pericholecystic stranding. No peripancreatic stranding. Adrenal glands are normal. No acute process in the incompletely imaged upper abdomen. Musculoskeletal: No acute bone process. No chest wall lesion. No destructive bone finding. Glenohumeral and spinal degenerative changes. Review of the MIP images confirms the above findings. IMPRESSION: 1. Negative for pulmonary embolism. 2. Basilar airspace disease, likely atelectasis. 3. Endotracheal tube in place terminating just at the thoracic inlet. Balloon inflation just below the larynx. This was previously communicated as outlined on previous  chest radiograph. 4. Cholelithiasis without evidence of acute cholecystitis. 5. Aortic atherosclerosis. Aortic Atherosclerosis (ICD10-I70.0). Electronically Signed   By: Donzetta Kohut M.D.   On: 11/17/2019 18:46   CT Cervical Spine Wo Contrast  Result Date: 11/17/2019 CLINICAL DATA:  Altered level of consciousness, trauma, unresponsive, found down EXAM: CT HEAD WITHOUT CONTRAST CT CERVICAL SPINE WITHOUT CONTRAST TECHNIQUE: Multidetector CT imaging of the head and cervical spine was performed following the standard protocol without intravenous contrast. Multiplanar CT image reconstructions of the cervical spine were also generated. COMPARISON:  08/31/2019 FINDINGS: CT HEAD FINDINGS Brain: Hypodensity left parietal cortex consistent with chronic infarct, stable. No signs of acute infarct or hemorrhage. Lateral ventricles and midline structures are unremarkable. No acute extra-axial fluid collections. No mass effect. Vascular: No hyperdense vessel or unexpected calcification. Skull: Normal. Negative for fracture or focal lesion. Sinuses/Orbits: No acute finding. Other: None. CT CERVICAL SPINE FINDINGS Alignment: Alignment is anatomic. Skull base and vertebrae: No acute displaced fracture. Soft tissues and spinal canal: Patient is intubated, endotracheal tube at level of thoracic inlet. Enteric catheter is identified, distal margin excluded by slice selection. Prevertebral soft tissues are unremarkable. No visible canal hematoma. Disc levels: Stable diffuse facet hypertrophy. Mild spondylosis at C3-4 and C6-7. There is mild diffuse neural foraminal encroachment related to facet hypertrophic change. Findings are stable. Upper chest: Lung apices are clear. Other: Reconstructed images demonstrate no additional findings. IMPRESSION: 1. No acute intracranial process. 2. Stable chronic left parietal infarct. 3. No acute cervical spine fracture. Electronically Signed   By: Sharlet Salina M.D.   On: 11/17/2019 18:36   MR  BRAIN WO CONTRAST  Result Date: 11/18/2019 CLINICAL DATA:  Altered mental status with unknown cause. Chronic kidney disease EXAM: MRI HEAD WITHOUT CONTRAST TECHNIQUE: Multiplanar, multiecho pulse sequences of the brain and surrounding structures were obtained without intravenous contrast. COMPARISON:  Head CT from yesterday FINDINGS: Brain: No acute infarction, hemorrhage, hydrocephalus, extra-axial collection or mass lesion. Remote left frontal parietal infarct with hemosiderin deposition, small. Chronic small vessel ischemia in the cerebral white  matter and pons, moderately extensive. Prominent cortical spinal radiations on diffusion imaging attributed to anisotropy rather than pathology. Age normal brain volume. Vascular: Preserved flow voids Skull and upper cervical spine: No focal marrow lesion. Degenerative facet spurring with C3-4 facet ankylosis. Sinuses/Orbits: Bilateral cataract resection IMPRESSION: 1. No emergent or reversible finding. 2. Senescent changes and small remote left frontal parietal cortex infarct. Electronically Signed   By: Marnee Spring M.D.   On: 11/18/2019 07:26   DG Chest Port 1 View  Result Date: 11/17/2019 CLINICAL DATA:  Tube adjustment. COVID positive patient, found unresponsive EXAM: PORTABLE CHEST 1 VIEW COMPARISON:  Chest x-ray November 17, 2019 FINDINGS: Interval advancement of the endotracheal tube now approximately 5 cm above the carina. Gastric tube in place with tip in the stomach in the upper abdomen. Cardiomediastinal contours are stable. Lungs are clear. Visualized skeletal structures on limited assessment are unremarkable. IMPRESSION: No acute cardiopulmonary disease with interval advancement of endotracheal tube now approximately 5 cm above the carina. Electronically Signed   By: Donzetta Kohut M.D.   On: 11/17/2019 19:30   DG Chest Port 1 View  Result Date: 11/17/2019 CLINICAL DATA:  Respiratory failure EXAM: PORTABLE CHEST 1 VIEW COMPARISON:  11/16/2019  FINDINGS: Endotracheal tube approximately 6.8 cm above the carina. Tip is above the level of the clavicular heads. Gastric tube in the stomach. Cardiomediastinal contours are unchanged, likely normal accounting for low depth of expansion and projection. Lungs are clear. No acute skeletal process on limited assessment. IMPRESSION: 1. No acute cardiopulmonary process on limited assessment. 2. Endotracheal tube, tip above the clavicular heads approximately 7 cm from the carina. Consider slight advancement approximately 2 cm with repeat radiography to document placement. 3. These results were called by telephone at the time of interpretation on 11/17/2019 at 6:22 pm to provider [facility] , who verbally acknowledged these results. Electronically Signed   By: Donzetta Kohut M.D.   On: 11/17/2019 18:22   DG Chest Port 1 View  Result Date: 11/16/2019 CLINICAL DATA:  Bilateral lower extremity spasms for 1 week. EXAM: PORTABLE CHEST 1 VIEW COMPARISON:  Single-view of the chest 09/01/2019. FINDINGS: Lungs are clear. Heart size is normal. No pneumothorax or pleural effusion. Aortic atherosclerosis. No acute or focal bony abnormality. IMPRESSION: No acute disease. Aortic Atherosclerosis (ICD10-I70.0). Electronically Signed   By: Drusilla Kanner M.D.   On: 11/16/2019 16:32

## 2019-11-21 ENCOUNTER — Inpatient Hospital Stay (HOSPITAL_COMMUNITY): Payer: Medicare Other

## 2019-11-21 ENCOUNTER — Other Ambulatory Visit: Payer: Self-pay

## 2019-11-21 DIAGNOSIS — I350 Nonrheumatic aortic (valve) stenosis: Secondary | ICD-10-CM

## 2019-11-21 DIAGNOSIS — I340 Nonrheumatic mitral (valve) insufficiency: Secondary | ICD-10-CM

## 2019-11-21 DIAGNOSIS — R7989 Other specified abnormal findings of blood chemistry: Secondary | ICD-10-CM

## 2019-11-21 DIAGNOSIS — I352 Nonrheumatic aortic (valve) stenosis with insufficiency: Secondary | ICD-10-CM

## 2019-11-21 LAB — ECHOCARDIOGRAM LIMITED
AR max vel: 1.66 cm2
AV Area VTI: 1.76 cm2
AV Area mean vel: 1.58 cm2
AV Mean grad: 11.5 mmHg
AV Peak grad: 21 mmHg
Ao pk vel: 2.29 m/s
Area-P 1/2: 1.75 cm2
Height: 66 in
MV VTI: 2.28 cm2
S' Lateral: 1.8 cm
Weight: 2998.26 oz

## 2019-11-21 LAB — COMPREHENSIVE METABOLIC PANEL
ALT: 26 U/L (ref 0–44)
AST: 39 U/L (ref 15–41)
Albumin: 2.6 g/dL — ABNORMAL LOW (ref 3.5–5.0)
Alkaline Phosphatase: 72 U/L (ref 38–126)
Anion gap: 12 (ref 5–15)
BUN: 8 mg/dL (ref 8–23)
CO2: 16 mmol/L — ABNORMAL LOW (ref 22–32)
Calcium: 8.9 mg/dL (ref 8.9–10.3)
Chloride: 113 mmol/L — ABNORMAL HIGH (ref 98–111)
Creatinine, Ser: 0.7 mg/dL (ref 0.44–1.00)
GFR calc Af Amer: >60 mL/min (ref >60)
GFR calc non Af Amer: >60 mL/min (ref >60)
Glucose, Bld: 126 mg/dL — ABNORMAL HIGH (ref 70–99)
Potassium: 3.1 mmol/L — ABNORMAL LOW (ref 3.5–5.1)
Sodium: 141 mmol/L (ref 135–145)
Total Bilirubin: 0.8 mg/dL (ref 0.3–1.2)
Total Protein: 6.4 g/dL — ABNORMAL LOW (ref 6.5–8.1)

## 2019-11-21 LAB — CBC WITH DIFFERENTIAL/PLATELET
Abs Immature Granulocytes: 0.11 10*3/uL — ABNORMAL HIGH (ref 0.00–0.07)
Basophils Absolute: 0.1 10*3/uL (ref 0.0–0.1)
Basophils Relative: 1 %
Eosinophils Absolute: 0.3 10*3/uL (ref 0.0–0.5)
Eosinophils Relative: 4 %
HCT: 31.5 % — ABNORMAL LOW (ref 36.0–46.0)
Hemoglobin: 10.2 g/dL — ABNORMAL LOW (ref 12.0–15.0)
Immature Granulocytes: 1 %
Lymphocytes Relative: 21 %
Lymphs Abs: 1.6 10*3/uL (ref 0.7–4.0)
MCH: 29.1 pg (ref 26.0–34.0)
MCHC: 32.4 g/dL (ref 30.0–36.0)
MCV: 90 fL (ref 80.0–100.0)
Monocytes Absolute: 0.7 10*3/uL (ref 0.1–1.0)
Monocytes Relative: 9 %
Neutro Abs: 4.9 10*3/uL (ref 1.7–7.7)
Neutrophils Relative %: 64 %
Platelets: 262 10*3/uL (ref 150–400)
RBC: 3.5 MIL/uL — ABNORMAL LOW (ref 3.87–5.11)
RDW: 13.3 % (ref 11.5–15.5)
WBC: 7.7 10*3/uL (ref 4.0–10.5)
nRBC: 0 % (ref 0.0–0.2)

## 2019-11-21 LAB — LACTIC ACID, PLASMA: Lactic Acid, Venous: 1.3 mmol/L (ref 0.5–1.9)

## 2019-11-21 LAB — TSH: TSH: 2.173 u[IU]/mL (ref 0.350–4.500)

## 2019-11-21 LAB — D-DIMER, QUANTITATIVE: D-Dimer, Quant: 3.97 ug/mL-FEU — ABNORMAL HIGH (ref 0.00–0.50)

## 2019-11-21 LAB — MAGNESIUM: Magnesium: 1.6 mg/dL — ABNORMAL LOW (ref 1.7–2.4)

## 2019-11-21 LAB — BRAIN NATRIURETIC PEPTIDE: B Natriuretic Peptide: 343.3 pg/mL — ABNORMAL HIGH (ref 0.0–100.0)

## 2019-11-21 LAB — C-REACTIVE PROTEIN: CRP: 9.5 mg/dL — ABNORMAL HIGH (ref <1.0)

## 2019-11-21 MED ORDER — POTASSIUM CHLORIDE CRYS ER 20 MEQ PO TBCR
40.0000 meq | EXTENDED_RELEASE_TABLET | Freq: Four times a day (QID) | ORAL | Status: AC
  Administered 2019-11-21 (×2): 40 meq via ORAL
  Filled 2019-11-21 (×2): qty 2

## 2019-11-21 MED ORDER — APIXABAN 5 MG PO TABS: 5.0000 mg | ORAL_TABLET | Freq: Two times a day (BID) | ORAL | Status: DC

## 2019-11-21 MED ORDER — APIXABAN 5 MG PO TABS
10.0000 mg | ORAL_TABLET | Freq: Two times a day (BID) | ORAL | Status: DC
  Administered 2019-11-21 – 2019-11-22 (×2): 10 mg via ORAL
  Filled 2019-11-21 (×4): qty 2

## 2019-11-21 MED ORDER — MAGNESIUM SULFATE 2 GM/50ML IV SOLN
2.0000 g | Freq: Once | INTRAVENOUS | Status: AC
  Administered 2019-11-21: 2 g via INTRAVENOUS
  Filled 2019-11-21: qty 50

## 2019-11-21 NOTE — Progress Notes (Signed)
PROGRESS NOTE                                                                                                                                                                                                             Patient Demographics:    Robin Moreno, is a 86 y.o. female, DOB - Jan 02, 1926, JZP:[id number]  Outpatient Primary MD for the patient is Robin Ok, MD    LOS - 4  Admit date - 11/17/2019    Chief Complaint  Patient presents with  . Altered Mental Status       Brief Narrative -  86year-old African-American female who lives at home with her son with underlying history of DJD, hypertension, dyslipidemia, CKD stage IIIa was brought to the ER because she was found fallen down in her house with her head caught between wall and bedrails unresponsive with agonal breathing.  She was brought to the ER intubated and admitted under ICU.  She has history of COVID-19 which she acquired several months ago and was incidentally COVID-19 positive this admission with a cycle count of over 42 requiring no isolation.  He was stabilized by critical care and transferred to my care on 11/20/2019.   Subjective:    Robin Moreno today he remains quite confused, denies any headache, chest pain, she is unreliable historian.    Assessment  & Plan :     1. Unresponsive state at home with agonal breathing requiring endotracheal intubation for airway protection, extubated within 24 hours by PCCM, unclear reason for unresponsive state, CT head and MRI brain nonacute, was stable on telemetry, currently no focal deficits but quite encephalopathic.  Likely has underlying dementia, once improved will check orthostatics, she is not a candidate for aggressive invasive testing and interventions.  Continue supportive care, PT OT evaluation and monitoring.  Had detailed discussions with patient's son he understands that his mother is quite old and frail but  would like to take her home when she is better.    2.  Resolved active COVID-19 infection from several months ago.  Incidental Covid positive, cycle count over 42 no isolation needed.  Per critical care note she was cleared by infection prevention.  3.  Underlying mild delirium with hospital-acquired metabolic encephalopathy.  Supportive care, minimize narcotics and benzodiazepines.  4. Essential hypertension.  Medications adjusted increased dose of  calcium channel blocker with as needed Lopressor added.  5.  Dyslipidemia.  On statin at home dose.  6.  Paroxysmal A. fib : Heart rate controlled on Cardizem 80 mg every 8 hours .2D echo is pending, TSH within normal limit . 8.  Pulmonary embolism : Noted on CTA chest yesterday, she is started on Eliquis .     Condition -  Guarded  Family Communication  : Dellia Nims [phone] on 11/21/2019  Code Status :  DNR  Consults  :  PCCM  Procedures  :    CT Head and MRI Brain - No acute changes, remote old CVA.  CTA chest -  PUD Prophylaxis : PPI  Disposition Plan  :    Status is: Inpatient  Remains inpatient appropriate because:IV treatments appropriate due to intensity of illness or inability to take PO   Dispo: The patient is from: Home              Anticipated d/c is to: Home              Anticipated d/c date is: 1 day              Patient currently is not medically stable to d/c.   DVT Prophylaxis  :  Lovenox    Lab Results  Component Value Date   PLT 262 11/21/2019    Diet :  Diet Order            DIET - DYS 1 Room service appropriate? No; Fluid consistency: Honey Thick  Diet effective now                  Inpatient Medications  Scheduled Meds: . apixaban  10 mg Oral BID   Followed by  . [START ON 11/28/2019] apixaban  5 mg Oral BID  . Chlorhexidine Gluconate Cloth  6 each Topical Daily  . diltiazem  90 mg Oral Q8H  . feeding supplement (ENSURE ENLIVE)  237 mL Oral BID BM  . mouth rinse  15 mL Mouth  Rinse BID  . pantoprazole  40 mg Oral Daily  . rosuvastatin  10 mg Oral Daily   Continuous Infusions:  PRN Meds:.docusate sodium, metoprolol tartrate, polyethylene glycol, Resource ThickenUp Clear  Antibiotics  :    Anti-infectives (From admission, onward)   Start     Dose/Rate Route Frequency Ordered Stop   11/19/19 0600  vancomycin (VANCOCIN) IVPB 1000 mg/200 mL premix  Status:  Discontinued        1,000 mg 200 mL/hr over 60 Minutes Intravenous Every 24 hours 11/18/19 0356 11/19/19 1211   11/18/19 0400  ceFEPIme (MAXIPIME) 2 g in sodium chloride 0.9 % 100 mL IVPB  Status:  Discontinued        2 g 200 mL/hr over 30 Minutes Intravenous Every 12 hours 11/18/19 0356 11/19/19 1211   11/18/19 0400  vancomycin (VANCOREADY) IVPB 1500 mg/300 mL        1,500 mg 150 mL/hr over 120 Minutes Intravenous  Once 11/18/19 0356 11/18/19 0950        Mliss Fritz Kimiyo Carmicheal M.D on 11/21/2019 at 11:47 AM  To page go to [email or web address]  [facility] -  Office  [phone]    See all Orders from today for further details    Objective:   Vitals:   11/21/19 0600 11/21/19 0647 11/21/19 0731 11/21/19 0754  BP:  (!) 165/84 (!) 157/74   Pulse:   86   Resp:  (!) 22 19  Temp:   97.9 F (36.6 C)   TempSrc:   Oral   SpO2:  100%  99%  Weight: 85 kg     Height:        Wt Readings from Last 3 Encounters:  11/21/19 85 kg  09/01/19 70 kg  09/29/18 64.9 kg     Intake/Output Summary (Last 24 hours) at 11/21/2019 1147 Last data filed at 11/21/2019 0908 Gross per 24 hour  Intake 770 ml  Output 1350 ml  Net -580 ml     Physical Exam  Awake, extremely frail, hard of hearing, confused . Symmetrical Chest wall movement, Good air movement bilaterally, CTAB RRR,No Gallops,Rubs or new Murmurs, No Parasternal Heave +ve B.Sounds, Abd Soft, No tenderness, No rebound - guarding or rigidity. No Cyanosis, Clubbing or edema, No new Rash or bruise       Data Review:    CBC Recent Labs  Lab  11/16/19 1701 11/16/19 1701 11/17/19 1635 11/17/19 1640 11/17/19 1822 11/18/19 0347 11/21/19 0726  WBC 6.3  --   --  9.0  --  10.1 7.7  HGB 12.6   < > 9.5* 9.7* 8.8* 10.7* 10.2*  HCT 42.2   < > 28.0* 31.0* 26.0* 33.9* 31.5*  PLT 227  --   --  239  --  244 262  MCV 99.1  --   --  96.3  --  96.3 90.0  MCH 29.6  --   --  30.1  --  30.4 29.1  MCHC 29.9*  --   --  31.3  --  31.6 32.4  RDW 13.2  --   --  13.2  --  13.2 13.3  LYMPHSABS 1.3  --   --  2.3  --   --  1.6  MONOABS 0.4  --   --  0.7  --   --  0.7  EOSABS 0.1  --   --  0.2  --   --  0.3  BASOSABS 0.0  --   --  0.0  --   --  0.1   < > = values in this interval not displayed.    Recent Labs  Lab 11/16/19 1701 11/16/19 1701 11/17/19 1635 11/17/19 1640 11/17/19 1641 11/17/19 1822 11/18/19 0300 11/18/19 0347 11/18/19 0940 11/18/19 1448 11/21/19 0726  NA 141   < > 141 138  --  141  --  142  --   --  141  K 5.1   < > 3.6 4.0  --  3.8  --  4.4  --   --  3.1*  CL 108  --   --  110  --   --   --  110  --   --  113*  CO2 25  --   --  18*  --   --   --  19*  --   --  16*  GLUCOSE 122*  --   --  213*  --   --   --  101*  --   --  126*  BUN 16  --   --  16  --   --   --  12  --   --  8  CREATININE 0.90  --   --  0.95  --   --   --  0.80  --   --  0.70  CALCIUM 9.3  --   --  8.1*  --   --   --  8.5*  --   --  8.9  AST 24  --   --  32  --   --   --   --   --   --  39  ALT 15  --   --  22  --   --   --   --   --   --  26  ALKPHOS 81  --   --  68  --   --   --   --   --   --  72  BILITOT 0.5  --   --  0.7  --   --   --   --   --   --  0.8  ALBUMIN 3.3*  --   --  2.6*  --   --   --   --   --   --  2.6*  MG  --   --   --   --   --   --   --  1.9  --   --  1.6*  CRP  --   --   --   --   --   --   --  10.1*  --   --  9.5*  DDIMER  --   --   --   --   --   --   --   --  4.56*  --  3.97*  PROCALCITON  --   --   --   --   --   --   --  0.12  --   --   --   LATICACIDVEN  --   --   --   --  2.2*  --  3.3*  --   --  3.0* 1.3  TSH  --    --   --   --   --   --   --   --   --   --  2.173  HGBA1C  --   --   --   --   --   --  5.6  --   --   --   --   AMMONIA  --   --   --   --  13  --   --   --   --   --   --   BNP  --   --   --   --   --   --   --   --   --   --  343.3*   < > = values in this interval not displayed.    ------------------------------------------------------------------------------------------------------------------ No results for input(s): CHOL, HDL, LDLCALC, TRIG, CHOLHDL, LDLDIRECT in the last 72 hours.  Lab Results  Component Value Date   HGBA1C 5.6 11/18/2019   ------------------------------------------------------------------------------------------------------------------ Recent Labs    11/21/19 0726  TSH 2.173    Cardiac Enzymes No results for input(s): CKMB, TROPONINI, MYOGLOBIN in the last 168 hours.  Invalid input(s): CK ------------------------------------------------------------------------------------------------------------------    Component Value Date/Time   BNP 343.3 (H) 11/21/2019 2831    Micro Results Recent Results (from the past 240 hour(s))  SARS Coronavirus 2 by RT PCR (hospital order, performed in [facility] hospital lab) Nasopharyngeal Nasopharyngeal Swab     Status: Abnormal   Collection Time: 11/16/19  5:02 PM   Specimen: Nasopharyngeal Swab  Result Value Ref Range Status   SARS Coronavirus 2 POSITIVE (A) NEGATIVE Final    Comment: RESULT CALLED TO, READ BACK BY AND VERIFIED WITH: L  MEEKS RN 11/16/19 AT 1907 SK (NOTE) SARS-CoV-2 target nucleic acids are DETECTED  SARS-CoV-2 RNA is generally detectable in upper respiratory specimens  during the acute phase of infection.  Positive results are indicative  of the presence of the identified virus, but do not rule out bacterial infection or co-infection with other pathogens not detected by the test.  Clinical correlation with patient history and  other diagnostic information is necessary to determine  patient infection status.  The expected result is negative.  Fact Sheet for Patients:   BoilerBrush.com.cy   Fact Sheet for Healthcare Providers:   https://pope.com/    This test is not yet approved or cleared by the [place] FDA and  has been authorized for detection and/or diagnosis of SARS-CoV-2 by FDA under an Emergency Use Authorization (EUA).  This EUA will remain in effect (meaning this test ca n be used) for the duration of  the COVID-19 declaration under Section 564(b)(1) of the Act, 21 U.S.C. section 360-bbb-3(b)(1), unless the authorization is terminated or revoked sooner.  Performed at [facility], [address] [place], [place] [address]   MRSA PCR Screening     Status: None   Collection Time: 11/18/19  1:01 AM   Specimen: Urine, Catheterized; Nasopharyngeal  Result Value Ref Range Status   MRSA by PCR NEGATIVE NEGATIVE Final    Comment:        The GeneXpert MRSA Assay (FDA approved for NASAL specimens only), is one component of a comprehensive MRSA colonization surveillance program. It is not intended to diagnose MRSA infection nor to guide or monitor treatment for MRSA infections. Performed at [facility], [address], [place], [place] [address]   Culture, blood (routine x 2)     Status: None (Preliminary result)   Collection Time: 11/18/19 10:29 AM   Specimen: BLOOD  Result Value Ref Range Status   Specimen Description BLOOD RIGHT ANTECUBITAL  Final   Special Requests   Final    BOTTLES DRAWN AEROBIC AND ANAEROBIC Blood Culture results may not be optimal due to an inadequate volume of blood received in culture bottles   Culture   Final    NO GROWTH 3 DAYS Performed at [facility], [address] [place], [place] [address]    Report Status PENDING  Incomplete  Culture, blood (routine x 2)     Status: None (Preliminary result)   Collection Time: 11/18/19 10:29 AM   Specimen:  BLOOD RIGHT HAND  Result Value Ref Range Status   Specimen Description BLOOD RIGHT HAND  Final   Special Requests AEROBIC BOTTLE ONLY Blood Culture adequate volume  Final   Culture   Final    NO GROWTH 3 DAYS Performed at [facility], [address], [place], [place] [address]    Report Status PENDING  Incomplete    Radiology Reports CT HEAD WO CONTRAST  Result Date: 11/17/2019 CLINICAL DATA:  Altered level of consciousness, trauma, unresponsive, found down EXAM: CT HEAD WITHOUT CONTRAST CT CERVICAL SPINE WITHOUT CONTRAST TECHNIQUE: Multidetector CT imaging of the head and cervical spine was performed following the standard protocol without intravenous contrast. Multiplanar CT image reconstructions of the cervical spine were also generated. COMPARISON:  08/31/2019 FINDINGS: CT HEAD FINDINGS Brain: Hypodensity left parietal cortex consistent with chronic infarct, stable. No signs of acute infarct or hemorrhage. Lateral ventricles and midline structures are unremarkable. No acute extra-axial fluid collections. No mass effect. Vascular: No hyperdense vessel or unexpected calcification.  Skull: Normal. Negative for fracture or focal lesion. Sinuses/Orbits: No acute finding. Other: None. CT CERVICAL SPINE FINDINGS Alignment: Alignment is anatomic. Skull base and vertebrae: No acute displaced fracture. Soft tissues and spinal canal: Patient is intubated, endotracheal tube at level of thoracic inlet. Enteric catheter is identified, distal margin excluded by slice selection. Prevertebral soft tissues are unremarkable. No visible canal hematoma. Disc levels: Stable diffuse facet hypertrophy. Mild spondylosis at C3-4 and C6-7. There is mild diffuse neural foraminal encroachment related to facet hypertrophic change. Findings are stable. Upper chest: Lung apices are clear. Other: Reconstructed images demonstrate no additional findings. IMPRESSION: 1. No acute intracranial process. 2. Stable chronic left  parietal infarct. 3. No acute cervical spine fracture. Electronically Signed   By: Sharlet Salina M.D.   On: 11/17/2019 18:36   CT ANGIO CHEST PE W OR WO CONTRAST  Result Date: 11/20/2019 CLINICAL DATA:  86 year old female with shortness of breath. Positive COVID-19. Concern for pulmonary embolism. EXAM: CT ANGIOGRAPHY CHEST WITH CONTRAST TECHNIQUE: Multidetector CT imaging of the chest was performed using the standard protocol during bolus administration of intravenous contrast. Multiplanar CT image reconstructions and MIPs were obtained to evaluate the vascular anatomy. CONTRAST:  60mL OMNIPAQUE IOHEXOL 350 MG/ML SOLN COMPARISON:  Chest CT dated 11/17/2019. FINDINGS: Evaluation is very limited due to respiratory motion artifact and streak artifact caused by patient's arms. Cardiovascular: There is no cardiomegaly or pericardial effusion. Three-vessel coronary vascular calcification. There is moderate atherosclerotic calcification of the thoracic aorta. Evaluation of the pulmonary arteries is limited due to respiratory motion artifact. There is a band like density in the right upper lobe pulmonary artery branch (94/6) which may be artifactual and represent a branch point or a nonocclusive thrombus. Additional apparent filling defects in the subsegmental branches of the right lower lobe (1710/6) may represent pulmonary emboli. Evaluation is very limited due to severe motion artifact. V/Q scan may provide better evaluation if clinically indicated. No large central pulmonary artery embolus identified. Mediastinum/Nodes: There is no hilar or mediastinal adenopathy. The esophagus is grossly unremarkable. No mediastinal fluid collection. Lungs/Pleura: Small bilateral pleural effusions. There is an area of consolidative change involving the right lower lobe similar or slightly progressed since the prior CT which may represent atelectasis or pneumonia. Linear left lung base atelectasis noted. No pneumothorax. The  central airways are patent. Upper Abdomen: No acute abnormality. Musculoskeletal: Osteopenia with degenerative changes of the spine. No acute osseous pathology. Old anterior rib fractures. Review of the MIP images confirms the above findings. IMPRESSION: 1. Very limited study due to respiratory motion artifact and streak artifact caused by patient's arms. 2. Probable small right upper lobe and right lower lobe subsegmental pulmonary emboli. No large central pulmonary artery embolus identified. V/Q scan may provide better evaluation if clinically indicated. 3. Small bilateral pleural effusions with an area of consolidative change involving the right lower lobe similar or slightly progressed since the prior CT which may represent atelectasis or pneumonia. 4. Aortic Atherosclerosis (ICD10-I70.0). These results were called by telephone at the time of interpretation on 11/20/2019 at 7:51 pm to Dr. Antionette Char, who verbally acknowledged these results. Electronically Signed   By: Elgie Collard M.D.   On: 11/20/2019 20:08   CT Angio Chest PE W and/or Wo Contrast  Result Date: 11/17/2019 CLINICAL DATA:  Unresponsive, axonal breathing. EXAM: CT ANGIOGRAPHY CHEST WITH CONTRAST TECHNIQUE: Multidetector CT imaging of the chest was performed using the standard protocol during bolus administration of intravenous contrast. Multiplanar CT image reconstructions and MIPs  were obtained to evaluate the vascular anatomy. CONTRAST:  59mL OMNIPAQUE IOHEXOL 350 MG/ML SOLN COMPARISON:  Chest x-ray same date., remote chest CT from 2005 FINDINGS: Cardiovascular: Calcified atheromatous plaque in the thoracic aorta. Calcifications of the aortic valve. Mitral annular calcifications. No aneurysmal dilation of the thoracic aorta. Limited assessment of the aorta due to bolus timing. Contour is smooth. No pericardial effusion. Heart size is normal. Central pulmonary vascular caliber is normal. Study quality is good. No pulmonary embolism.  Mediastinum/Nodes: Endotracheal tube in place terminating just at the thoracic inlet. Balloon inflation just below the larynx. This was previously communicated. Gastric tube passes through the esophagus into the stomach. No adenopathy at the thoracic inlet. No mediastinal adenopathy. No hilar adenopathy. Lungs/Pleura: Basilar airspace disease. No filling defects or fluid in bronchi. No pneumothorax. No lobar consolidation. No pleural effusion. Upper Abdomen: Gallbladder is distended, moderately with gallstones in the dependent portion. No pericholecystic stranding. No peripancreatic stranding. Adrenal glands are normal. No acute process in the incompletely imaged upper abdomen. Musculoskeletal: No acute bone process. No chest wall lesion. No destructive bone finding. Glenohumeral and spinal degenerative changes. Review of the MIP images confirms the above findings. IMPRESSION: 1. Negative for pulmonary embolism. 2. Basilar airspace disease, likely atelectasis. 3. Endotracheal tube in place terminating just at the thoracic inlet. Balloon inflation just below the larynx. This was previously communicated as outlined on previous chest radiograph. 4. Cholelithiasis without evidence of acute cholecystitis. 5. Aortic atherosclerosis. Aortic Atherosclerosis (ICD10-I70.0). Electronically Signed   By: Donzetta Kohut M.D.   On: 11/17/2019 18:46   CT Cervical Spine Wo Contrast  Result Date: 11/17/2019 CLINICAL DATA:  Altered level of consciousness, trauma, unresponsive, found down EXAM: CT HEAD WITHOUT CONTRAST CT CERVICAL SPINE WITHOUT CONTRAST TECHNIQUE: Multidetector CT imaging of the head and cervical spine was performed following the standard protocol without intravenous contrast. Multiplanar CT image reconstructions of the cervical spine were also generated. COMPARISON:  08/31/2019 FINDINGS: CT HEAD FINDINGS Brain: Hypodensity left parietal cortex consistent with chronic infarct, stable. No signs of acute infarct or  hemorrhage. Lateral ventricles and midline structures are unremarkable. No acute extra-axial fluid collections. No mass effect. Vascular: No hyperdense vessel or unexpected calcification. Skull: Normal. Negative for fracture or focal lesion. Sinuses/Orbits: No acute finding. Other: None. CT CERVICAL SPINE FINDINGS Alignment: Alignment is anatomic. Skull base and vertebrae: No acute displaced fracture. Soft tissues and spinal canal: Patient is intubated, endotracheal tube at level of thoracic inlet. Enteric catheter is identified, distal margin excluded by slice selection. Prevertebral soft tissues are unremarkable. No visible canal hematoma. Disc levels: Stable diffuse facet hypertrophy. Mild spondylosis at C3-4 and C6-7. There is mild diffuse neural foraminal encroachment related to facet hypertrophic change. Findings are stable. Upper chest: Lung apices are clear. Other: Reconstructed images demonstrate no additional findings. IMPRESSION: 1. No acute intracranial process. 2. Stable chronic left parietal infarct. 3. No acute cervical spine fracture. Electronically Signed   By: Sharlet Salina M.D.   On: 11/17/2019 18:36   MR BRAIN WO CONTRAST  Result Date: 11/18/2019 CLINICAL DATA:  Altered mental status with unknown cause. Chronic kidney disease EXAM: MRI HEAD WITHOUT CONTRAST TECHNIQUE: Multiplanar, multiecho pulse sequences of the brain and surrounding structures were obtained without intravenous contrast. COMPARISON:  Head CT from yesterday FINDINGS: Brain: No acute infarction, hemorrhage, hydrocephalus, extra-axial collection or mass lesion. Remote left frontal parietal infarct with hemosiderin deposition, small. Chronic small vessel ischemia in the cerebral white matter and pons, moderately extensive. Prominent cortical spinal radiations  on diffusion imaging attributed to anisotropy rather than pathology. Age normal brain volume. Vascular: Preserved flow voids Skull and upper cervical spine: No focal  marrow lesion. Degenerative facet spurring with C3-4 facet ankylosis. Sinuses/Orbits: Bilateral cataract resection IMPRESSION: 1. No emergent or reversible finding. 2. Senescent changes and small remote left frontal parietal cortex infarct. Electronically Signed   By: Marnee Spring M.D.   On: 11/18/2019 07:26   DG Chest Port 1 View  Result Date: 11/17/2019 CLINICAL DATA:  Tube adjustment. COVID positive patient, found unresponsive EXAM: PORTABLE CHEST 1 VIEW COMPARISON:  Chest x-ray November 17, 2019 FINDINGS: Interval advancement of the endotracheal tube now approximately 5 cm above the carina. Gastric tube in place with tip in the stomach in the upper abdomen. Cardiomediastinal contours are stable. Lungs are clear. Visualized skeletal structures on limited assessment are unremarkable. IMPRESSION: No acute cardiopulmonary disease with interval advancement of endotracheal tube now approximately 5 cm above the carina. Electronically Signed   By: Donzetta Kohut M.D.   On: 11/17/2019 19:30   DG Chest Port 1 View  Result Date: 11/17/2019 CLINICAL DATA:  Respiratory failure EXAM: PORTABLE CHEST 1 VIEW COMPARISON:  11/16/2019 FINDINGS: Endotracheal tube approximately 6.8 cm above the carina. Tip is above the level of the clavicular heads. Gastric tube in the stomach. Cardiomediastinal contours are unchanged, likely normal accounting for low depth of expansion and projection. Lungs are clear. No acute skeletal process on limited assessment. IMPRESSION: 1. No acute cardiopulmonary process on limited assessment. 2. Endotracheal tube, tip above the clavicular heads approximately 7 cm from the carina. Consider slight advancement approximately 2 cm with repeat radiography to document placement. 3. These results were called by telephone at the time of interpretation on 11/17/2019 at 6:22 pm to provider [facility] , who verbally acknowledged these results. Electronically Signed   By: Donzetta Kohut M.D.   On:  11/17/2019 18:22   DG Chest Port 1 View  Result Date: 11/16/2019 CLINICAL DATA:  Bilateral lower extremity spasms for 1 week. EXAM: PORTABLE CHEST 1 VIEW COMPARISON:  Single-view of the chest 09/01/2019. FINDINGS: Lungs are clear. Heart size is normal. No pneumothorax or pleural effusion. Aortic atherosclerosis. No acute or focal bony abnormality. IMPRESSION: No acute disease. Aortic Atherosclerosis (ICD10-I70.0). Electronically Signed   By: Drusilla Kanner M.D.   On: 11/16/2019 16:32

## 2019-11-21 NOTE — Progress Notes (Signed)
ANTICOAGULATION CONSULT NOTE - Initial Consult  Pharmacy Consult for Lovenox -> apixaban Indication: pulmonary embolus  No Known Allergies  Patient Measurements: Height: 5\' 6"  (167.6 cm) Weight: 85 kg (187 lb 6.3 oz) IBW/kg (Calculated) : 59.3  Vital Signs: Temp: 97.8 F (36.6 C) (08/25 0437) Temp Source: Oral (08/25 0437) BP: 165/84 (08/25 0647) Pulse Rate: 78 (08/25 0437)  Labs: No results for input(s): HGB, HCT, PLT, APTT, LABPROT, INR, HEPARINUNFRC, HEPRLOWMOCWT, CREATININE, CKTOTAL, CKMB, TROPONINIHS in the last 72 hours.  Estimated Creatinine Clearance: 49.3 mL/min (by C-G formula based on SCr of 0.8 mg/dL).   Medical History: Past Medical History:  Diagnosis Date  . Arthritis   . CVA (cerebral vascular accident) (HCC)   . Degenerative disk disease   . Hypercholesterolemia   . Hypertension   . Osteoarthritis of right knee   . Renal disorder   . Stage 3a chronic kidney disease 09/01/2019    Assessment: 86 y.o. on Lovenox for probable PE on CT angio. Last dose given this morning at 0430. Pharmacy consulted to transition to Eliquis. Last Hgb 10.7, plt wnl.  Goal of Therapy:  Treatment of PE Monitor platelets by anticoagulation protocol: Yes   Plan:  D/c Lovenox Eliquis 10mg  po BID x 7 days followed by 5mg  po BID Eliquis education to be completed  99, PharmD, BCPS Please see amion for complete clinical pharmacist phone list 11/21/2019,6:53 AM

## 2019-11-21 NOTE — Progress Notes (Signed)
Lower extremity venous has been completed.   Preliminary results in CV Proc.   [name] 11/21/2019 1:53 PM

## 2019-11-21 NOTE — Care Management (Signed)
Benefit check sent for DOACs 

## 2019-11-21 NOTE — Progress Notes (Signed)
  Echocardiogram 2D Echocardiogram has been performed.  Tye Savoy 11/21/2019, 3:31 PM

## 2019-11-21 NOTE — TOC Benefit Eligibility Note (Signed)
Transition of Care [facility]) Benefit Eligibility Note    Patient Details  Name: Robin Moreno MRN: [id number] Date of Birth: 06-26-1925   Medication/Dose: Carlena Hurl 15 MG BID    and   XARELTO 20 MG DAILY   RIVAROXABAN : NON-FORMULARY   /   ELIQUIS  10 MG BID  and  ELIQUIS  5 MG BID   APIXABAN : NON-FORMULARY  Covered?: Yes  Tier: 3 Drug  Prescription Coverage [facility]erred Pharmacy: [facility]  Spoke with Person/Company/Phone Number:: STEPHANIE  @ [employer] # [phone]  Co-Pay: 25 %   OF TOTAL COST  FOR EACH PRESCRIPTION  Prior Approval: No  Deductible: Unmet (LOWE INCOME SUBSIDY LEVEL- 3)  Additional Notes: SECONDARY INS: MEDICAID [place]   EFF-DATE -02-26-2017  CO-PAY- $ 4.00 FOR EACH PRESCRIPTION    Mardene Sayer Phone Number: 11/21/2019, 12:56 PM

## 2019-11-21 NOTE — Discharge Instructions (Signed)
Information on my medicine - ELIQUIS (apixaban)  This medication education was reviewed with me or my healthcare representative as part of my discharge preparation.    Why was Eliquis prescribed for you? Eliquis was prescribed to treat blood clots that may have been found in the veins of your legs (deep vein thrombosis) or in your lungs (pulmonary embolism) and to reduce the risk of them occurring again.  What do You need to know about Eliquis ? The starting dose is 10 mg (two 5 mg tablets) taken TWICE daily for the FIRST SEVEN (7) DAYS, then on (enter date)  11/28/19 in the evening  the dose is reduced to ONE 5 mg tablet taken TWICE daily.  Eliquis may be taken with or without food.   Try to take the dose about the same time in the morning and in the evening. If you have difficulty swallowing the tablet whole please discuss with your pharmacist how to take the medication safely.  Take Eliquis exactly as prescribed and DO NOT stop taking Eliquis without talking to the doctor who prescribed the medication.  Stopping may increase your risk of developing a new blood clot.  Refill your prescription before you run out.  After discharge, you should have regular check-up appointments with your healthcare provider that is prescribing your Eliquis.    What do you do if you miss a dose? If a dose of ELIQUIS is not taken at the scheduled time, take it as soon as possible on the same day and twice-daily administration should be resumed. The dose should not be doubled to make up for a missed dose.  Important Safety Information A possible side effect of Eliquis is bleeding. You should call your healthcare provider right away if you experience any of the following: ? Bleeding from an injury or your nose that does not stop. ? Unusual colored urine (red or dark brown) or unusual colored stools (red or black). ? Unusual bruising for unknown reasons. ? A serious fall or if you hit your head (even if  there is no bleeding).  Some medicines may interact with Eliquis and might increase your risk of bleeding or clotting while on Eliquis. To help avoid this, consult your healthcare provider or pharmacist prior to using any new prescription or non-prescription medications, including herbals, vitamins, non-steroidal anti-inflammatory drugs (NSAIDs) and supplements.  This website has more information on Eliquis (apixaban): http://www.eliquis.com/eliquis/home  =====================================================  Pulmonary Embolism    A pulmonary embolism (PE) is a sudden blockage or decrease of blood flow in one or both lungs. Most blockages come from a blood clot that forms in the vein of a lower leg, thigh, or arm (deep vein thrombosis, DVT) and travels to the lungs. A clot is blood that has thickened into a gel or solid. PE is a dangerous and life-threatening condition that needs to be treated right away.  What are the causes? This condition is usually caused by a blood clot that forms in a vein and moves to the lungs. In rare cases, it may be caused by air, fat, part of a tumor, or other tissue that moves through the veins and into the lungs.  What increases the risk? The following factors may make you more likely to develop this condition: 1. Experiencing a traumatic injury, such as breaking a hip or leg. 2. Having: ? A spinal cord injury. ? Orthopedic surgery, especially hip or knee replacement. ? Any major surgery. ? A stroke. ? DVT. ? Blood clots or  blood clotting disease. ? Long-term (chronic) lung or heart disease. ? Cancer treated with chemotherapy. ? A central venous catheter. 3. Taking medicines that contain estrogen. These include birth control pills and hormone replacement therapy. 4. Being: ? Pregnant. ? In the period of time after your baby is delivered (postpartum). ? Older than age 74. ? Overweight. ? A smoker, especially if you have other risks.  What are the  signs or symptoms? Symptoms of this condition usually start suddenly and include:  Shortness of breath during activity or at rest.  Coughing, coughing up blood, or coughing up blood-tinged mucus.  Chest pain that is often worse with deep breaths.  Rapid or irregular heartbeat.  Feeling light-headed or dizzy.  Fainting.  Feeling anxious.  Fever.  Sweating.  Pain and swelling in a leg. This is a symptom of DVT, which can lead to PE. How is this diagnosed? This condition may be diagnosed based on:  Your medical history.  A physical exam.  Blood tests.  CT pulmonary angiogram. This test checks blood flow in and around your lungs.  Ventilation-perfusion scan, also called a lung VQ scan. This test measures air flow and blood flow to the lungs.  An ultrasound of the legs.  How is this treated? Treatment for this condition depends on many factors, such as the cause of your PE, your risk for bleeding or developing more clots, and other medical conditions you have. Treatment aims to remove, dissolve, or stop blood clots from forming or growing larger. Treatment may include: 1. Medicines, such as: ? Blood thinning medicines (anticoagulants) to stop clots from forming. ? Medicines that dissolve clots (thrombolytics). 2. Procedures, such as: ? Using a flexible tube to remove a blood clot (embolectomy) or to deliver medicine to destroy it (catheter-directed thrombolysis). ? Inserting a filter into a large vein that carries blood to the heart (inferior vena cava). This filter (vena cava filter) catches blood clots before they reach the lungs. ? Surgery to remove the clot (surgical embolectomy). This is rare. You may need a combination of immediate, long-term (up to 3 months after diagnosis), and extended (more than 3 months after diagnosis) treatments. Your treatment may continue for several months (maintenance therapy). You and your health care provider will work together to choose  the treatment program that is best for you.  Follow these instructions at home: Medicines 1. Take over-the-counter and prescription medicines only as told by your health care provider. 2. If you are taking an anticoagulant medicine: ? Take the medicine every day at the same time each day. ? Understand what foods and drugs interact with your medicine. ? Understand the side effects of this medicine, including excessive bruising or bleeding. Ask your health care provider or pharmacist about other side effects.  General instructions  Wear a medical alert bracelet or carry a medical alert card that says you have had a PE and lists what medicines you take.  Ask your health care provider when you may return to your normal activities. Avoid sitting or lying for a long time without moving.  Maintain a healthy weight. Ask your health care provider what weight is healthy for you.  Do not use any products that contain nicotine or tobacco, such as cigarettes, e-cigarettes, and chewing tobacco. If you need help quitting, ask your health care provider.  Talk with your health care provider about any travel plans. It is important to make sure that you are still able to take your medicine while on trips.  Keep all follow-up visits as told by your health care provider. This is important.  Contact a health care provider if:  You missed a dose of your blood thinner medicine.  Get help right away if: 1. You have: ? New or increased pain, swelling, warmth, or redness in an arm or leg. ? Numbness or tingling in an arm or leg. ? Shortness of breath during activity or at rest. ? A fever. ? Chest pain. ? A rapid or irregular heartbeat. ? A severe headache. ? Vision changes. ? A serious fall or accident, or you hit your head. ? Stomach (abdominal) pain. ? Blood in your vomit, stool, or urine. ? A cut that will not stop bleeding. 2. You cough up blood. 3. You feel light-headed or dizzy. 4. You cannot  move your arms or legs. 5. You are confused or have memory loss.  These symptoms may represent a serious problem that is an emergency. Do not wait to see if the symptoms will go away. Get medical help right away. Call your local emergency services (911 in the [place] Do not drive yourself to the hospital. Summary  A pulmonary embolism (PE) is a sudden blockage or decrease of blood flow in one or both lungs. PE is a dangerous and life-threatening condition that needs to be treated right away.  Treatments for this condition usually include medicines to thin your blood (anticoagulants) or medicines to break apart blood clots (thrombolytics).  If you are given blood thinners, it is important to take the medicine every day at the same time each day.  Understand what foods and drugs interact with any medicines that you are taking.  If you have signs of PE or DVT, call your local emergency services (911 in the [place] This information is not intended to replace advice given to you by your health care provider. Make sure you discuss any questions you have with your health care provider. Document Revised: 12/21/2017 Document Reviewed: 12/21/2017 Elsevier Patient Education  2020 [employer].

## 2019-11-22 DIAGNOSIS — L8990 Pressure ulcer of unspecified site, unspecified stage: Secondary | ICD-10-CM

## 2019-11-22 LAB — COMPREHENSIVE METABOLIC PANEL
ALT: 23 U/L (ref 0–44)
AST: 36 U/L (ref 15–41)
Albumin: 2.5 g/dL — ABNORMAL LOW (ref 3.5–5.0)
Alkaline Phosphatase: 70 U/L (ref 38–126)
Anion gap: 10 (ref 5–15)
BUN: 8 mg/dL (ref 8–23)
CO2: 18 mmol/L — ABNORMAL LOW (ref 22–32)
Calcium: 8.9 mg/dL (ref 8.9–10.3)
Chloride: 111 mmol/L (ref 98–111)
Creatinine, Ser: 0.7 mg/dL (ref 0.44–1.00)
GFR calc Af Amer: >60 mL/min (ref >60)
GFR calc non Af Amer: >60 mL/min (ref >60)
Glucose, Bld: 119 mg/dL — ABNORMAL HIGH (ref 70–99)
Potassium: 4.1 mmol/L (ref 3.5–5.1)
Sodium: 139 mmol/L (ref 135–145)
Total Bilirubin: 0.3 mg/dL (ref 0.3–1.2)
Total Protein: 5.9 g/dL — ABNORMAL LOW (ref 6.5–8.1)

## 2019-11-22 LAB — CBC WITH DIFFERENTIAL/PLATELET
Abs Immature Granulocytes: 0.13 10*3/uL — ABNORMAL HIGH (ref 0.00–0.07)
Basophils Absolute: 0.1 10*3/uL (ref 0.0–0.1)
Basophils Relative: 1 %
Eosinophils Absolute: 0.3 10*3/uL (ref 0.0–0.5)
Eosinophils Relative: 5 %
HCT: 30.8 % — ABNORMAL LOW (ref 36.0–46.0)
Hemoglobin: 10 g/dL — ABNORMAL LOW (ref 12.0–15.0)
Immature Granulocytes: 2 %
Lymphocytes Relative: 24 %
Lymphs Abs: 1.5 10*3/uL (ref 0.7–4.0)
MCH: 30.2 pg (ref 26.0–34.0)
MCHC: 32.5 g/dL (ref 30.0–36.0)
MCV: 93.1 fL (ref 80.0–100.0)
Monocytes Absolute: 0.6 10*3/uL (ref 0.1–1.0)
Monocytes Relative: 10 %
Neutro Abs: 3.7 10*3/uL (ref 1.7–7.7)
Neutrophils Relative %: 58 %
Platelets: 234 10*3/uL (ref 150–400)
RBC: 3.31 MIL/uL — ABNORMAL LOW (ref 3.87–5.11)
RDW: 13.5 % (ref 11.5–15.5)
WBC: 6.3 10*3/uL (ref 4.0–10.5)
nRBC: 0 % (ref 0.0–0.2)

## 2019-11-22 LAB — MAGNESIUM: Magnesium: 2 mg/dL (ref 1.7–2.4)

## 2019-11-22 LAB — D-DIMER, QUANTITATIVE: D-Dimer, Quant: 2.93 ug/mL-FEU — ABNORMAL HIGH (ref 0.00–0.50)

## 2019-11-22 LAB — C-REACTIVE PROTEIN: CRP: 7.3 mg/dL — ABNORMAL HIGH (ref <1.0)

## 2019-11-22 LAB — BRAIN NATRIURETIC PEPTIDE: B Natriuretic Peptide: 175.9 pg/mL — ABNORMAL HIGH (ref 0.0–100.0)

## 2019-11-22 MED ORDER — PANTOPRAZOLE SODIUM 40 MG PO TBEC
40.0000 mg | DELAYED_RELEASE_TABLET | Freq: Every day | ORAL | 0 refills | Status: DC
Start: 2019-11-23 — End: 2019-11-22

## 2019-11-22 MED ORDER — METOPROLOL TARTRATE 25 MG PO TABS
25.0000 mg | ORAL_TABLET | Freq: Two times a day (BID) | ORAL | 0 refills | Status: DC
Start: 2019-11-22 — End: 2020-02-13

## 2019-11-22 MED ORDER — PANTOPRAZOLE SODIUM 40 MG PO TBEC: 40.0000 mg | DELAYED_RELEASE_TABLET | Freq: Every day | ORAL | 0 refills | Status: AC

## 2019-11-22 MED ORDER — METOPROLOL TARTRATE 25 MG PO TABS
25.0000 mg | ORAL_TABLET | Freq: Two times a day (BID) | ORAL | Status: DC
  Administered 2019-11-22: 25 mg via ORAL
  Filled 2019-11-22: qty 1

## 2019-11-22 MED ORDER — APIXABAN 5 MG PO TABS: ORAL_TABLET | ORAL | 0 refills | Status: DC

## 2019-11-22 MED ORDER — METOPROLOL TARTRATE 25 MG PO TABS
25.0000 mg | ORAL_TABLET | Freq: Two times a day (BID) | ORAL | 0 refills | Status: DC
Start: 2019-11-22 — End: 2019-11-22

## 2019-11-22 MED ORDER — APIXABAN 5 MG PO TABS: ORAL_TABLET | ORAL | 0 refills | Status: AC

## 2019-11-22 MED FILL — Phenylephrine HCl IV Soln 10 MG/ML: INTRAVENOUS | Qty: 1 | Status: AC

## 2019-11-22 MED FILL — Sodium Chloride IV Soln 0.9%: INTRAVENOUS | Qty: 250 | Status: AC

## 2019-11-22 MED FILL — ELIQUIS 5 MG TABLET: 5 | 30 days supply | Qty: 70 | Fill #0

## 2019-11-22 MED FILL — METOPROLOL TARTRATE 25 MG T: 25 | 30 days supply | Qty: 60 | Fill #0

## 2019-11-22 MED FILL — PANTOPRAZOLE SOD DR 40 MG T: 40 | 30 days supply | Qty: 30 | Fill #0

## 2019-11-22 NOTE — Progress Notes (Signed)
Occupational Therapy Treatment Patient Details Name: Robin Moreno MRN: [id number] DOB: 15-Dec-1925 Today's Date: 11/22/2019    History of present illness Pt is a 86 y/o female admitted 11/17/19 after found unresponsive in her home hospital bed with head caught between rails (neck compressed), and with agonal breathing; pt intubated in field. Admission COVID 19 PCR positive. Pt has h/o COVID 19 in 07/2019. 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 Other PMH includes arthritis, CVA, HTN, OA of R knee, CKD, THA.   OT comments  Pt seen for OT follow up session with focus on increased BADL engagement. Pt completed bed mobility with total A +2 and significant R posterolateral lean when seated EOB. Pt hips sliding off of bed in this position, ultimately required total A +2 return to bed. Bed then placed in chair position in preparation for feeding. Pt requires max A and hand over hand A to self feed, along with continuous verbal and sequencing cues. Pt is overall very pleasantly confused. Continue to recommend SNF, but family refusing. Pt will need hoyer lift to get OOB and PTAR transport home. Will continue to follow.    Follow Up Recommendations  Home health OT;Supervision/Assistance - 24 hour;Other (comment) (family refusing SNF)    Equipment Recommendations  Other (comment) (hoyer lift with lift pads)    Recommendations for Other Services      Precautions / Restrictions Precautions Precautions: Fall Restrictions Weight Bearing Restrictions: No       Mobility Bed Mobility Overal bed mobility: Needs Assistance Bed Mobility: Supine to Sit;Sit to Supine;Rolling Rolling: Total assist;+2 for physical assistance   Supine to sit: Total assist;+2 for physical assistance Sit to supine: Total assist;+2 for physical assistance   General bed mobility comments: TotalA+2 to sit EOB, pt with significant R-side lateral and posterior lean with L hip sliding from bed despite feet on  floor, totalA+2 return to supine. TotalA+2 to roll R/L for repositioning. Pt not resisting movement, but doing nothing to assist  Transfers                      Balance Overall balance assessment: Needs assistance Sitting-balance support: Feet supported Sitting balance-Leahy Scale: Zero   Postural control: Posterior lean;Right lateral lean                                 ADL either performed or assessed with clinical judgement   ADL Overall ADL's : Needs assistance/impaired Eating/Feeding: Maximal assistance;Bed level Eating/Feeding Details (indicate cue type and reason): hand over hand assist to guide cup to mouth. SLP present to evaluate swallowing simulatenously Grooming: Wash/dry face;Maximal assistance;Bed level                               Functional mobility during ADLs: Total assistance;+2 for safety/equipment;+2 for physical assistance (bed mobility only) General ADL Comments: pt is otherwise requiring total A for ADL     Vision       Perception     Praxis      Cognition Arousal/Alertness: Awake/alert Behavior During Therapy: Flat affect Overall Cognitive Status: No family/caregiver present to determine baseline cognitive functioning                                 General Comments: H/o dementia. Pleasantly confused.  Pt inconsistently following simple commands but requiring max multimodal cues to do so. Difficult to determine apparent cognitive impairment vs. pt's baseline + very HOH.        Exercises     Shoulder Instructions       General Comments VSS on RA    Pertinent Vitals/ Pain       Pain Assessment: Faces Faces Pain Scale: Hurts little more Pain Location: Neck w/ L-side rotation Pain Descriptors / Indicators: Grimacing;Guarding;Moaning Pain Intervention(s): Monitored during session;Repositioned  Home Living                                          Prior  Functioning/Environment              Frequency  Min 2X/week        Progress Toward Goals  OT Goals(current goals can now be found in the care plan section)  Progress towards OT goals: Progressing toward goals  Acute Rehab OT Goals OT Goal Formulation: Patient unable to participate in goal setting Time For Goal Achievement: 12/03/19 Potential to Achieve Goals: Fair  Plan Discharge plan needs to be updated    Co-evaluation                 AM-PAC OT "6 Clicks" Daily Activity     Outcome Measure   Help from another person eating meals?: A Lot Help from another person taking care of personal grooming?: A Lot Help from another person toileting, which includes using toliet, bedpan, or urinal?: Total Help from another person bathing (including washing, rinsing, drying)?: Total Help from another person to put on and taking off regular upper body clothing?: Total Help from another person to put on and taking off regular lower body clothing?: Total 6 Click Score: 8    End of Session    OT Visit Diagnosis: Other abnormalities of gait and mobility (R26.89);Muscle weakness (generalized) (M62.81);Other symptoms and signs involving cognitive function   Activity Tolerance Patient tolerated treatment well   Patient Left in bed;with call bell/phone within reach   Nurse Communication Mobility status;Need for lift equipment        Time: 1007-1035 OT Time Calculation (min): 28 min  Charges: OT General Charges $OT Visit: 1 Visit OT Treatments $Self Care/Home Management : 8-22 mins  Dalphine Handing, MSOT, OTR/L Acute Rehabilitation Services [facility] Office Number: [phone] Pager: [phone]  Dalphine Handing 11/22/2019, 1:28 PM

## 2019-11-22 NOTE — Progress Notes (Signed)
Robin Moreno to be D/C'd Home per MD order.  Discussed with the patient and all questions fully answered.  VSS, Skin clean, dry and does have a stage 2 pressure injury on the left side of her sacrum and a deep tissue pressure injury on both heels. IV catheter discontinued intact. Site without signs and symptoms of complications. Dressing and pressure applied.  An After Visit Summary was printed and given to the patient. Patient received prescription.  D/c education completed with patient/family including follow up instructions, medication list, d/c activities limitations if indicated, with other d/c instructions as indicated by MD - patient able to verbalize understanding, all questions fully answered.   Patient instructed to return to ED, call 911, or call MD for any changes in condition.   Patient escorted via WC, and D/C home via PTAR.  [name] 11/22/2019 11:51 AM

## 2019-11-22 NOTE — Discharge Summary (Addendum)
Robin Moreno, is a 86 y.o. female  DOB 01-22-1926  MRN [id number].  Admission date:  11/17/2019  Admitting Physician  Collier Bullock, MD  Discharge Date:  11/22/2019   Primary MD  Jilda Panda, MD  Recommendations for primary care physician for things to follow:  -Please check CBC, BMP during next visit. -Patient to follow with cardiology as an outpatient regarding new diagnosis of A. fib, and possible obstructive cardiomyopathy .  Admission Diagnosis  Respiratory arrest ([place]) [R09.2] Respiratory distress [R06.03] Hypotension, unspecified hypotension type [I95.9] Altered mental status, unspecified altered mental status type [R41.82] COVID-19 [U07.1]   Discharge Diagnosis  Respiratory arrest ([place]) [R09.2] Respiratory distress [R06.03] Hypotension, unspecified hypotension type [I95.9] Altered mental status, unspecified altered mental status type [R41.82] COVID-19 [U07.1]    Active Problems:   COVID-19   Respiratory distress   Pressure injury of skin   Pulmonary embolism on right [facility])      Past Medical History:  Diagnosis Date  . Arthritis   . CVA (cerebral vascular accident) ([place])   . Degenerative disk disease   . Hypercholesterolemia   . Hypertension   . Osteoarthritis of right knee   . Renal disorder   . Stage 3a chronic kidney disease 09/01/2019    Past Surgical History:  Procedure Laterality Date  . ABDOMINAL HYSTERECTOMY    . TOTAL HIP ARTHROPLASTY         History of present illness and  Hospital Course:     Kindly see H&P for history of present illness and admission details, please review complete Labs, Consult reports and Test reports for all details in brief  HPI  from the history and physical done on the day of admission 11/17/2019   Brief History   86 y.o. F with OMH of CKD, arthritis, chronic immobility, Covid-19 infection in 07/2019 requiring hospital admission  without intubation who was found with agonal breathing and unresponsive and was intubated.  Brought to the ED where CT head and chest without evidence of PE or CVA. She has been vaccinated for Covid-19, Covid test positive in the ED without infiltrates on CXR.  PCCM consulted for admission  History of present illness   Robin Moreno is a 86 y.o. F with OMH of CKD, arthritis, chronic immobility, Covid-19 infection in 07/2019 requiring hospital admission without intubation and vaccinated for Covid-19.  She lives with her son and who reported that she was in her usual state of health this morning.  He phoned her around 2:15 pm the day of admission and said she had been sleeping.  He got home a short time later and found her between the bed and the rail with her neck pressed against the rail and unresponsive with agonal breathing.  EMS intubated in the field and started on epi gtt for hypotension.    ED workup included CT head and CT chest which were negative for acute hemorrhage, PE or significant infiltrates.  Labs without leukocytosis, significant electrolyte derangement or elevated troponin.  Lactic acid 2.2, ABG with  alkalosis.     Son states that she had the [place] and [place] vaccine about three months ago, she has had a mild, nagging cough for weeks which they were not too concerned about.  No known ill contacts.     Hospital Course   Unresponsive state at home with agonal breathing requiring endotracheal intubation for airway protection, extubated within 24 hours by PCCM, unclear reason for unresponsive state, CT head and MRI brain nonacute, was stable on telemetry, currently no focal deficits, she is more interactive and appropriate, but clearly she is having underlying dementia, made significantly weak, she was seen by PT, Had detailed discussions with patient's son he understands that his mother is quite old and frail but would like to take her home when she is better.    Resolved active  COVID-19 infection from several months ago.  Incidental Covid positive, cycle count over 42 no isolation needed.  Per critical care note she was cleared by infection prevention.  Underlying mild delirium with hospital-acquired metabolic encephalopathy.    Improving, no acute findings on MRI, there is some underlying dementia as well.  Essential hypertension.  Medications adjusted given new diagnosis of cardiomyopathy and A. Fib, Lopressor has been stopped, and Cardizem has been switched to metoprolol.  Dyslipidemia.  On statin at home dose.  Paroxysmal A. fib :  New diagnosis, Cardizem has been switched to metoprolol given cardiomyopathy . -She is started on Eliquis  -She had 2D echo done, which was questionable for obstructive cardiomyopathy, discussed with cardiologist Dr. Nechama Guard, further work-up can as outpatient  but for now recommendations is is to continue with metoprolol, to stop enalapril, discussed with cardiology coordinator who will arrange for outpatient follow-up. - TSH within normal limit .   Pulmonary embolism : Noted on CTA chest , she is started on Eliquis .   Pressure injury of skin Pressure Injury 11/18/19 Sacrum Left;Right Stage 2 -  Partial thickness loss of dermis presenting as a shallow open injury with a red, pink wound bed without slough. (Active)  11/18/19 0102  Location: Sacrum  Location Orientation: Left;Right  Staging: Stage 2 -  Partial thickness loss of dermis presenting as a shallow open injury with a red, pink wound bed without slough.  Wound Description (Comments):   Present on Admission: Yes     Pressure Injury 11/18/19 Heel Right Deep Tissue Pressure Injury - Purple or maroon localized area of discolored intact skin or blood-filled blister due to damage of underlying soft tissue from pressure and/or shear. (Active)  11/18/19 0102  Location: Heel  Location Orientation: Right  Staging: Deep Tissue Pressure Injury - Purple or maroon localized area of  discolored intact skin or blood-filled blister due to damage of underlying soft tissue from pressure and/or shear.  Wound Description (Comments):   Present on Admission: Yes     Pressure Injury 11/18/19 Heel Left Deep Tissue Pressure Injury - Purple or maroon localized area of discolored intact skin or blood-filled blister due to damage of underlying soft tissue from pressure and/or shear. (Active)  11/18/19 0102  Location: Heel  Location Orientation: Left  Staging: Deep Tissue Pressure Injury - Purple or maroon localized area of discolored intact skin or blood-filled blister due to damage of underlying soft tissue from pressure and/or shear.  Wound Description (Comments):   Present on Admission:          Discharge Condition:  Stable, patient high risk for readmission, recommendation has been for SNF, but son wanted patient to go  home with home health which has been arranged.  Follow UP   Follow-up Information    [facility].   Specialty: Home Health Services Contact information: [address] [place] [address] [phone]                 Discharge Instructions  and  Discharge Medications   Discharge Instructions    Discharge instructions   Complete by: As directed    Follow with Primary MD Jilda Panda, MD in 14 days   Get CBC, CMP,  checked  by Primary MD next visit.    Activity: As tolerated with Full fall precautions use walker/cane & assistance as needed   Disposition Home     For Heart failure patients - Check your Weight same time everyday, if you gain over 2 pounds, or you develop in leg swelling, experience more shortness of breath or chest pain, call your Primary MD immediately. Follow Cardiac Low Salt Diet and 1.5 lit/day fluid restriction.   On your next visit with your primary care physician please Get Medicines reviewed and adjusted.   Please request your Prim.MD to go over all Hospital Tests and  Procedure/Radiological results at the follow up, please get all Hospital records sent to your Prim MD by signing hospital release before you go home.   If you experience worsening of your admission symptoms, develop shortness of breath, life threatening emergency, suicidal or homicidal thoughts you must seek medical attention immediately by calling 911 or calling your MD immediately  if symptoms less severe.  You Must read complete instructions/literature along with all the possible adverse reactions/side effects for all the Medicines you take and that have been prescribed to you. Take any new Medicines after you have completely understood and accpet all the possible adverse reactions/side effects.   Do not drive, operating heavy machinery, perform activities at heights, swimming or participation in water activities or provide baby sitting services if your were admitted for syncope or siezures until you have seen by Primary MD or a Neurologist and advised to do so again.  Do not drive when taking Pain medications.    Do not take more than prescribed Pain, Sleep and Anxiety Medications  Special Instructions: If you have smoked or chewed Tobacco  in the last 2 yrs please stop smoking, stop any regular Alcohol  and or any Recreational drug use.  Wear Seat belts while driving.   Please note  You were cared for by a hospitalist during your hospital stay. If you have any questions about your discharge medications or the care you received while you were in the hospital after you are discharged, you can call the unit and asked to speak with the hospitalist on call if the hospitalist that took care of you is not available. Once you are discharged, your primary care physician will handle any further medical issues. Please note that NO REFILLS for any discharge medications will be authorized once you are discharged, as it is imperative that you return to your primary care physician (or establish a  relationship with a primary care physician if you do not have one) for your aftercare needs so that they can reassess your need for medications and monitor your lab values.   Increase activity slowly   Complete by: As directed    MyChart COVID-19 home monitoring program   Complete by: Nov 22, 2019    Is the patient willing to use the [place] for home monitoring?:  Yes   No wound care   Complete by: As directed    Temperature monitoring   Complete by: Nov 22, 2019    After how many days would you like to receive a notification of this patient's flowsheet entries?: 1     Allergies as of 11/22/2019   No Known Allergies     Medication List    STOP taking these medications   Aspirin Low Dose 81 MG EC tablet Generic drug: aspirin   diltiazem 60 MG tablet Commonly known as: CARDIZEM   enalapril 10 MG tablet Commonly known as: VASOTEC     TAKE these medications   acetaminophen 325 MG tablet Commonly known as: Tylenol Take 2 tablets (650 mg total) by mouth every 6 (six) hours as needed. What changed: reasons to take this   albuterol 108 (90 Base) MCG/ACT inhaler Commonly known as: VENTOLIN HFA Inhale 1-2 puffs into the lungs every 4 (four) hours as needed for wheezing or shortness of breath.   apixaban 5 MG Tabs tablet Commonly known as: ELIQUIS Please take 10 mg oral twice daily for next 5 days, then transition to 5 mg oral twice daily from 11/28/2019   baclofen 10 MG tablet Commonly known as: LIORESAL Take 10 mg by mouth 2 (two) times daily.   fluticasone 50 MCG/ACT nasal spray Commonly known as: FLONASE Place 1 spray into both nostrils daily as needed for allergies or rhinitis.   gabapentin 100 MG capsule Commonly known as: Neurontin Take 1 capsule (100 mg total) by mouth 3 (three) times daily.   meclizine 12.5 MG tablet Commonly known as: ANTIVERT Take 1 tablet (12.5 mg total) by mouth 3 (three) times daily as needed for dizziness.   metoprolol tartrate  25 MG tablet Commonly known as: LOPRESSOR Take 1 tablet (25 mg total) by mouth 2 (two) times daily.   pantoprazole 40 MG tablet Commonly known as: PROTONIX Take 1 tablet (40 mg total) by mouth daily. Start taking on: November 23, 2019   rosuvastatin 10 MG tablet Commonly known as: CRESTOR Take 10 mg by mouth daily.   SYSTANE OP Place 1 drop into both eyes every morning.   VITAMIN C PO Take 1 tablet by mouth daily.   Vitamin D 50 MCG (2000 UT) tablet Take 2,000 Units by mouth daily.   ZINC PO Take 1 tablet by mouth daily.            Durable Medical Equipment  (From admission, onward)         Start     Ordered   11/20/19 1336  For home use only DME Hospital bed  Once       Question Answer Comment  Length of Need 6 Months   Patient has (list medical condition): weakness, stroke   The above medical condition requires: Patient requires the ability to reposition frequently   Head must be elevated greater than: 30 degrees   Bed type Semi-electric   Hoyer Lift Yes   Support Surface: Gel Overlay      11/20/19 1336            Diet and Activity recommendation: See Discharge Instructions above  Consults obtained -  none   Major procedures and Radiology Reports - PLEASE review detailed and final reports for all details, in brief -     CT HEAD WO CONTRAST  Result Date: 11/17/2019 CLINICAL DATA:  Altered level of consciousness, trauma, unresponsive, found down EXAM: CT HEAD WITHOUT CONTRAST CT CERVICAL SPINE WITHOUT  CONTRAST TECHNIQUE: Multidetector CT imaging of the head and cervical spine was performed following the standard protocol without intravenous contrast. Multiplanar CT image reconstructions of the cervical spine were also generated. COMPARISON:  08/31/2019 FINDINGS: CT HEAD FINDINGS Brain: Hypodensity left parietal cortex consistent with chronic infarct, stable. No signs of acute infarct or hemorrhage. Lateral ventricles and midline structures are  unremarkable. No acute extra-axial fluid collections. No mass effect. Vascular: No hyperdense vessel or unexpected calcification. Skull: Normal. Negative for fracture or focal lesion. Sinuses/Orbits: No acute finding. Other: None. CT CERVICAL SPINE FINDINGS Alignment: Alignment is anatomic. Skull base and vertebrae: No acute displaced fracture. Soft tissues and spinal canal: Patient is intubated, endotracheal tube at level of thoracic inlet. Enteric catheter is identified, distal margin excluded by slice selection. Prevertebral soft tissues are unremarkable. No visible canal hematoma. Disc levels: Stable diffuse facet hypertrophy. Mild spondylosis at C3-4 and C6-7. There is mild diffuse neural foraminal encroachment related to facet hypertrophic change. Findings are stable. Upper chest: Lung apices are clear. Other: Reconstructed images demonstrate no additional findings. IMPRESSION: 1. No acute intracranial process. 2. Stable chronic left parietal infarct. 3. No acute cervical spine fracture. Electronically Signed   By: Randa Ngo M.D.   On: 11/17/2019 18:36   CT ANGIO CHEST PE W OR WO CONTRAST  Result Date: 11/20/2019 CLINICAL DATA:  86 year old female with shortness of breath. Positive COVID-19. Concern for pulmonary embolism. EXAM: CT ANGIOGRAPHY CHEST WITH CONTRAST TECHNIQUE: Multidetector CT imaging of the chest was performed using the standard protocol during bolus administration of intravenous contrast. Multiplanar CT image reconstructions and MIPs were obtained to evaluate the vascular anatomy. CONTRAST:  28m OMNIPAQUE IOHEXOL 350 MG/ML SOLN COMPARISON:  Chest CT dated 11/17/2019. FINDINGS: Evaluation is very limited due to respiratory motion artifact and streak artifact caused by patient's arms. Cardiovascular: There is no cardiomegaly or pericardial effusion. Three-vessel coronary vascular calcification. There is moderate atherosclerotic calcification of the thoracic aorta. Evaluation of the  pulmonary arteries is limited due to respiratory motion artifact. There is a band like density in the right upper lobe pulmonary artery branch (94/6) which may be artifactual and represent a branch point or a nonocclusive thrombus. Additional apparent filling defects in the subsegmental branches of the right lower lobe (1710/6) may represent pulmonary emboli. Evaluation is very limited due to severe motion artifact. V/Q scan may provide better evaluation if clinically indicated. No large central pulmonary artery embolus identified. Mediastinum/Nodes: There is no hilar or mediastinal adenopathy. The esophagus is grossly unremarkable. No mediastinal fluid collection. Lungs/Pleura: Small bilateral pleural effusions. There is an area of consolidative change involving the right lower lobe similar or slightly progressed since the prior CT which may represent atelectasis or pneumonia. Linear left lung base atelectasis noted. No pneumothorax. The central airways are patent. Upper Abdomen: No acute abnormality. Musculoskeletal: Osteopenia with degenerative changes of the spine. No acute osseous pathology. Old anterior rib fractures. Review of the MIP images confirms the above findings. IMPRESSION: 1. Very limited study due to respiratory motion artifact and streak artifact caused by patient's arms. 2. Probable small right upper lobe and right lower lobe subsegmental pulmonary emboli. No large central pulmonary artery embolus identified. V/Q scan may provide better evaluation if clinically indicated. 3. Small bilateral pleural effusions with an area of consolidative change involving the right lower lobe similar or slightly progressed since the prior CT which may represent atelectasis or pneumonia. 4. Aortic Atherosclerosis (ICD10-I70.0). These results were called by telephone at the time of interpretation on  11/20/2019 at 7:51 pm to Dr. Myna Hidalgo, who verbally acknowledged these results. Electronically Signed   By: Anner Crete  M.D.   On: 11/20/2019 20:08   CT Angio Chest PE W and/or Wo Contrast  Result Date: 11/17/2019 CLINICAL DATA:  Unresponsive, axonal breathing. EXAM: CT ANGIOGRAPHY CHEST WITH CONTRAST TECHNIQUE: Multidetector CT imaging of the chest was performed using the standard protocol during bolus administration of intravenous contrast. Multiplanar CT image reconstructions and MIPs were obtained to evaluate the vascular anatomy. CONTRAST:  69m OMNIPAQUE IOHEXOL 350 MG/ML SOLN COMPARISON:  Chest x-ray same date., remote chest CT from 2005 FINDINGS: Cardiovascular: Calcified atheromatous plaque in the thoracic aorta. Calcifications of the aortic valve. Mitral annular calcifications. No aneurysmal dilation of the thoracic aorta. Limited assessment of the aorta due to bolus timing. Contour is smooth. No pericardial effusion. Heart size is normal. Central pulmonary vascular caliber is normal. Study quality is good. No pulmonary embolism. Mediastinum/Nodes: Endotracheal tube in place terminating just at the thoracic inlet. Balloon inflation just below the larynx. This was previously communicated. Gastric tube passes through the esophagus into the stomach. No adenopathy at the thoracic inlet. No mediastinal adenopathy. No hilar adenopathy. Lungs/Pleura: Basilar airspace disease. No filling defects or fluid in bronchi. No pneumothorax. No lobar consolidation. No pleural effusion. Upper Abdomen: Gallbladder is distended, moderately with gallstones in the dependent portion. No pericholecystic stranding. No peripancreatic stranding. Adrenal glands are normal. No acute process in the incompletely imaged upper abdomen. Musculoskeletal: No acute bone process. No chest wall lesion. No destructive bone finding. Glenohumeral and spinal degenerative changes. Review of the MIP images confirms the above findings. IMPRESSION: 1. Negative for pulmonary embolism. 2. Basilar airspace disease, likely atelectasis. 3. Endotracheal tube in place  terminating just at the thoracic inlet. Balloon inflation just below the larynx. This was previously communicated as outlined on previous chest radiograph. 4. Cholelithiasis without evidence of acute cholecystitis. 5. Aortic atherosclerosis. Aortic Atherosclerosis (ICD10-I70.0). Electronically Signed   By: GZetta BillsM.D.   On: 11/17/2019 18:46   CT Cervical Spine Wo Contrast  Result Date: 11/17/2019 CLINICAL DATA:  Altered level of consciousness, trauma, unresponsive, found down EXAM: CT HEAD WITHOUT CONTRAST CT CERVICAL SPINE WITHOUT CONTRAST TECHNIQUE: Multidetector CT imaging of the head and cervical spine was performed following the standard protocol without intravenous contrast. Multiplanar CT image reconstructions of the cervical spine were also generated. COMPARISON:  08/31/2019 FINDINGS: CT HEAD FINDINGS Brain: Hypodensity left parietal cortex consistent with chronic infarct, stable. No signs of acute infarct or hemorrhage. Lateral ventricles and midline structures are unremarkable. No acute extra-axial fluid collections. No mass effect. Vascular: No hyperdense vessel or unexpected calcification. Skull: Normal. Negative for fracture or focal lesion. Sinuses/Orbits: No acute finding. Other: None. CT CERVICAL SPINE FINDINGS Alignment: Alignment is anatomic. Skull base and vertebrae: No acute displaced fracture. Soft tissues and spinal canal: Patient is intubated, endotracheal tube at level of thoracic inlet. Enteric catheter is identified, distal margin excluded by slice selection. Prevertebral soft tissues are unremarkable. No visible canal hematoma. Disc levels: Stable diffuse facet hypertrophy. Mild spondylosis at C3-4 and C6-7. There is mild diffuse neural foraminal encroachment related to facet hypertrophic change. Findings are stable. Upper chest: Lung apices are clear. Other: Reconstructed images demonstrate no additional findings. IMPRESSION: 1. No acute intracranial process. 2. Stable chronic  left parietal infarct. 3. No acute cervical spine fracture. Electronically Signed   By: MRanda NgoM.D.   On: 11/17/2019 18:36   MR BRAIN WO CONTRAST  Result Date:  11/18/2019 CLINICAL DATA:  Altered mental status with unknown cause. Chronic kidney disease EXAM: MRI HEAD WITHOUT CONTRAST TECHNIQUE: Multiplanar, multiecho pulse sequences of the brain and surrounding structures were obtained without intravenous contrast. COMPARISON:  Head CT from yesterday FINDINGS: Brain: No acute infarction, hemorrhage, hydrocephalus, extra-axial collection or mass lesion. Remote left frontal parietal infarct with hemosiderin deposition, small. Chronic small vessel ischemia in the cerebral white matter and pons, moderately extensive. Prominent cortical spinal radiations on diffusion imaging attributed to anisotropy rather than pathology. Age normal brain volume. Vascular: Preserved flow voids Skull and upper cervical spine: No focal marrow lesion. Degenerative facet spurring with C3-4 facet ankylosis. Sinuses/Orbits: Bilateral cataract resection IMPRESSION: 1. No emergent or reversible finding. 2. Senescent changes and small remote left frontal parietal cortex infarct. Electronically Signed   By: Monte Fantasia M.D.   On: 11/18/2019 07:26   DG Chest Port 1 View  Result Date: 11/17/2019 CLINICAL DATA:  Tube adjustment. COVID positive patient, found unresponsive EXAM: PORTABLE CHEST 1 VIEW COMPARISON:  Chest x-ray November 17, 2019 FINDINGS: Interval advancement of the endotracheal tube now approximately 5 cm above the carina. Gastric tube in place with tip in the stomach in the upper abdomen. Cardiomediastinal contours are stable. Lungs are clear. Visualized skeletal structures on limited assessment are unremarkable. IMPRESSION: No acute cardiopulmonary disease with interval advancement of endotracheal tube now approximately 5 cm above the carina. Electronically Signed   By: Zetta Bills M.D.   On: 11/17/2019 19:30   DG  Chest Port 1 View  Result Date: 11/17/2019 CLINICAL DATA:  Respiratory failure EXAM: PORTABLE CHEST 1 VIEW COMPARISON:  11/16/2019 FINDINGS: Endotracheal tube approximately 6.8 cm above the carina. Tip is above the level of the clavicular heads. Gastric tube in the stomach. Cardiomediastinal contours are unchanged, likely normal accounting for low depth of expansion and projection. Lungs are clear. No acute skeletal process on limited assessment. IMPRESSION: 1. No acute cardiopulmonary process on limited assessment. 2. Endotracheal tube, tip above the clavicular heads approximately 7 cm from the carina. Consider slight advancement approximately 2 cm with repeat radiography to document placement. 3. These results were called by telephone at the time of interpretation on 11/17/2019 at 6:22 pm to provider [facility] , who verbally acknowledged these results. Electronically Signed   By: Zetta Bills M.D.   On: 11/17/2019 18:22   DG Chest Port 1 View  Result Date: 11/16/2019 CLINICAL DATA:  Bilateral lower extremity spasms for 1 week. EXAM: PORTABLE CHEST 1 VIEW COMPARISON:  Single-view of the chest 09/01/2019. FINDINGS: Lungs are clear. Heart size is normal. No pneumothorax or pleural effusion. Aortic atherosclerosis. No acute or focal bony abnormality. IMPRESSION: No acute disease. Aortic Atherosclerosis (ICD10-I70.0). Electronically Signed   By: Inge Rise M.D.   On: 11/16/2019 16:32   VAS [place] LOWER EXTREMITY VENOUS (DVT)  Result Date: 11/21/2019  Lower Venous DVTStudy Indications: Rapidly rising ddimer.  Comparison Study: no prior Performing Technologist: Abram Sander RVS  Examination Guidelines: A complete evaluation includes B-mode imaging, spectral Doppler, color Doppler, and power Doppler as needed of all accessible portions of each vessel. Bilateral testing is considered an integral part of a complete examination. Limited examinations for reoccurring indications may be performed as noted.  The reflux portion of the exam is performed with the patient in reverse Trendelenburg.  +---------+---------------+---------+-----------+----------+--------------+ RIGHT    CompressibilityPhasicitySpontaneityPropertiesThrombus Aging +---------+---------------+---------+-----------+----------+--------------+ CFV      Full           Yes  Yes                                 +---------+---------------+---------+-----------+----------+--------------+ SFJ      Full                                                        +---------+---------------+---------+-----------+----------+--------------+ FV Prox  Full                                                        +---------+---------------+---------+-----------+----------+--------------+ FV Mid   Full                                                        +---------+---------------+---------+-----------+----------+--------------+ FV DistalFull                                                        +---------+---------------+---------+-----------+----------+--------------+ PFV      Full                                                        +---------+---------------+---------+-----------+----------+--------------+ POP      Full           Yes      Yes                                 +---------+---------------+---------+-----------+----------+--------------+ PTV      Full                                                        +---------+---------------+---------+-----------+----------+--------------+ PERO                                                  Not visualized +---------+---------------+---------+-----------+----------+--------------+   +---------+---------------+---------+-----------+----------+--------------+ LEFT     CompressibilityPhasicitySpontaneityPropertiesThrombus Aging +---------+---------------+---------+-----------+----------+--------------+ CFV      Full           Yes       Yes                                 +---------+---------------+---------+-----------+----------+--------------+ SFJ      Full                                                        +---------+---------------+---------+-----------+----------+--------------+  FV Prox  Full                                                        +---------+---------------+---------+-----------+----------+--------------+ FV Mid   Full                                                        +---------+---------------+---------+-----------+----------+--------------+ FV DistalFull                                                        +---------+---------------+---------+-----------+----------+--------------+ PFV      Full                                                        +---------+---------------+---------+-----------+----------+--------------+ POP      Full           Yes      Yes                                 +---------+---------------+---------+-----------+----------+--------------+ PTV      Full                                                        +---------+---------------+---------+-----------+----------+--------------+ PERO     Full                                                        +---------+---------------+---------+-----------+----------+--------------+     Summary: BILATERAL: - No evidence of deep vein thrombosis seen in the lower extremities, bilaterally. - No evidence of superficial venous thrombosis in the lower extremities, bilaterally. -   *See table(s) above for measurements and observations.    Preliminary    ECHOCARDIOGRAM LIMITED  Result Date: 11/21/2019    ECHOCARDIOGRAM LIMITED REPORT   Patient Name:   SHADASIA OLDFIELD Date of Exam: 11/21/2019 Medical Rec #:  [id number]     Height:       66.0 in Accession #:    [id number]    Weight:       187.4 lb Date of Birth:  [birth year]      BSA:          1.945 m Patient Age:    63 years      BP:            144/69 mmHg Patient Gender: F             HR:  77 bpm. Exam Location:  Inpatient Procedure: Limited Echo, Limited Color Doppler and Cardiac Doppler Indications:    CHF  History:        Patient has prior history of Echocardiogram examinations, most                 recent 10/10/2012. Covid Positive; Risk Factors:Hypertension.  Sonographer:    Mikki Santee RDCS (AE) Referring Phys: [id number]int[facility]  Sonographer Comments: Image acquisition challenging due to uncooperative patient. IMPRESSIONS  1. There is moderate asymmetric hypertrophy of the basal septum up to 1.5 cm. There is a gradient across the LVOT up to 27 mmHG. No clear SAM of the MV was observed, but this study was low quality due to patient not wanting to complete the study. No provocable maneuvers performed due to lack of cooperation. There was central MR, and the MV was noted to have severe MAC. 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 Would consider further characterization with Cardiac MR.  Left ventricular ejection fraction, by estimation, is 55 to 60%. The left ventricle has normal function. The left ventricle has no regional wall motion abnormalities. There is moderate asymmetric left ventricular hypertrophy of the basal-septal segment.  Left ventricular diastolic function could not be evaluated.  2. Right ventricular systolic function is normal. The right ventricular size is normal.  3. Left atrial size was mildly dilated.  4. The mitral valve is degenerative. Mild mitral valve regurgitation. Mild mitral stenosis. The mean mitral valve gradient is 3.0 mmHg with average heart rate of 77 bpm.  5. The aortic valve was not well visualized. Aortic valve regurgitation is not visualized. Mild aortic valve stenosis. Aortic valve area, by VTI measures 1.76 cm. Aortic valve mean gradient measures 11.5 mmHg. Aortic valve Vmax measures 2.29 m/s. FINDINGS  Left Ventricle: There is moderate asymmetric  hypertrophy of the basal septum up to 1.5 cm. There is a gradient across the LVOT up to 27 mmHG. No clear SAM of the MV was observed, but this study was low quality due to patient not wanting to complete the study. No provocable maneuvers performed due to lack of cooperation. There was central MR, and the MV was noted to have severe MAC. 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 Would consider further characterization with Cardiac MR. Left ventricular ejection fraction, by estimation, is 55 to 60%. The left ventricle has normal function. The left ventricle has no regional wall motion abnormalities. The left ventricular internal cavity size was small. There is moderate asymmetric left ventricular hypertrophy of the basal-septal segment. Left ventricular diastolic function could not be evaluated due to mitral annular calcification (moderate or greater). Right Ventricle: The right ventricular size is normal. No increase in right ventricular wall thickness. Right ventricular systolic function is normal. Left Atrium: Left atrial size was mildly dilated. Right Atrium: Right atrial size was normal in size. Pericardium: There is no evidence of pericardial effusion. Mitral Valve: The mitral valve is degenerative in appearance. Severe mitral annular calcification. Mild mitral valve regurgitation. Mild mitral valve stenosis. The mean mitral valve gradient is 3.0 mmHg with average heart rate of 77 bpm. Tricuspid Valve: The tricuspid valve is grossly normal. Tricuspid valve regurgitation is mild . No evidence of tricuspid stenosis. Aortic Valve: The aortic valve was not well visualized. Aortic valve regurgitation is not visualized. Mild aortic stenosis is present. Aortic valve mean gradient measures 11.5 mmHg. Aortic valve peak gradient measures 21.0 mmHg. Aortic valve area, by VTI  measures 1.76  cm. Pulmonic Valve: The pulmonic valve was not well visualized. Venous: The inferior vena cava was  not well visualized. LEFT VENTRICLE PLAX 2D LVIDd:         2.40 cm  Diastology LVIDs:         1.80 cm  LV e' lateral:   6.66 cm/s LV PW:         0.80 cm  LV E/e' lateral: 12.2 LV IVS:        1.50 cm  LV e' medial:    5.34 cm/s LVOT diam:     1.70 cm  LV E/e' medial:  15.2 LV SV:         73 LV SV Index:   37 LVOT Area:     2.27 cm  LEFT ATRIUM           Index       RIGHT ATRIUM          Index LA diam:      3.20 cm 1.65 cm/m  RA Area:     9.98 cm LA Vol (A4C): 66.4 ml 34.14 ml/m RA Volume:   18.10 ml 9.30 ml/m  AORTIC VALVE AV Area (Vmax):    1.66 cm AV Area (Vmean):   1.58 cm AV Area (VTI):     1.76 cm AV Vmax:           229.00 cm/s AV Vmean:          157.000 cm/s AV VTI:            0.413 m AV Peak Grad:      21.0 mmHg AV Mean Grad:      11.5 mmHg LVOT Vmax:         167.00 cm/s LVOT Vmean:        109.000 cm/s LVOT VTI:          0.321 m LVOT/AV VTI ratio: 0.78  AORTA Ao Root diam: 2.40 cm MITRAL VALVE                TRICUSPID VALVE MV Area (PHT): 1.75 cm     TR Peak grad:   28.1 mmHg MV Area VTI:   2.28 cm     TR Vmax:        265.00 cm/s MV Mean grad:  3.0 mmHg MV VTI:        0.32 m       SHUNTS MV Decel Time: 433 msec     Systemic VTI:  0.32 m MV E velocity: 81.40 cm/s   Systemic Diam: 1.70 cm MV A velocity: 106.00 cm/s MV E/A ratio:  0.77 Eleonore Chiquito MD Electronically signed by Eleonore Chiquito MD Signature Date/Time: 11/21/2019/4:39:49 PM    Final     Micro Results    Recent Results (from the past 240 hour(s))  SARS Coronavirus 2 by RT PCR (hospital order, performed in [place] hospital lab) Nasopharyngeal Nasopharyngeal Swab     Status: Abnormal   Collection Time: 11/16/19  5:02 PM   Specimen: Nasopharyngeal Swab  Result Value Ref Range Status   SARS Coronavirus 2 POSITIVE (A) NEGATIVE Final    Comment: RESULT CALLED TO, READ BACK BY AND VERIFIED WITH: L MEEKS RN 11/16/19 AT 1907 SK (NOTE) SARS-CoV-2 target nucleic acids are DETECTED  SARS-CoV-2 RNA is generally detectable in upper  respiratory specimens  during the acute phase of infection.  Positive results are indicative  of the presence of the identified virus, but do not rule out bacterial  infection or co-infection with other pathogens not detected by the test.  Clinical correlation with patient history and  other diagnostic information is necessary to determine patient infection status.  The expected result is negative.  Fact Sheet for Patients:   StrictlyIdeas.no   Fact Sheet for Healthcare Providers:   BankingDealers.co.za    This test is not yet approved or cleared by the [place] FDA and  has been authorized for detection and/or diagnosis of SARS-CoV-2 by FDA under an Emergency Use Authorization (EUA).  This EUA will remain in effect (meaning this test ca n be used) for the duration of  the COVID-19 declaration under Section 564(b)(1) of the Act, 21 U.S.C. section 360-bbb-3(b)(1), unless the authorization is terminated or revoked sooner.  Performed at [facility], [place] [address] [place], [place] [address]   MRSA PCR Screening     Status: None   Collection Time: 11/18/19  1:01 AM   Specimen: Urine, Catheterized; Nasopharyngeal  Result Value Ref Range Status   MRSA by PCR NEGATIVE NEGATIVE Final    Comment:        The GeneXpert MRSA Assay (FDA approved for NASAL specimens only), is one component of a comprehensive MRSA colonization surveillance program. It is not intended to diagnose MRSA infection nor to guide or monitor treatment for MRSA infections. Performed at [facility], [place] [address] [place], [place] [address]   Culture, blood (routine x 2)     Status: None (Preliminary result)   Collection Time: 11/18/19 10:29 AM   Specimen: BLOOD  Result Value Ref Range Status   Specimen Description BLOOD RIGHT ANTECUBITAL  Final   Special Requests   Final    BOTTLES DRAWN AEROBIC AND ANAEROBIC Blood Culture results may not be  optimal due to an inadequate volume of blood received in culture bottles   Culture   Final    NO GROWTH 4 DAYS Performed at [facility], [place] [address] [place], [place] [address]    Report Status PENDING  Incomplete  Culture, blood (routine x 2)     Status: None (Preliminary result)   Collection Time: 11/18/19 10:29 AM   Specimen: BLOOD RIGHT HAND  Result Value Ref Range Status   Specimen Description BLOOD RIGHT HAND  Final   Special Requests AEROBIC BOTTLE ONLY Blood Culture adequate volume  Final   Culture   Final    NO GROWTH 4 DAYS Performed at [facility], [place] [address], [place], [place] [address]    Report Status PENDING  Incomplete       Today   Subjective:   Marthenia Rolling no significant events as discussed with staff, she is herself denies any complaints, but she is poor historian .  Objective:   Blood pressure 123/64, pulse 75, temperature 98.5 F (36.9 C), temperature source Oral, resp. rate (!) 21, height _0  (1.676 m), weight 84.1 kg, SpO2 100 %.   Intake/Output Summary (Last 24 hours) at 11/22/2019 1131 Last data filed at 11/22/2019 0626 Gross per 24 hour  Intake 230 ml  Output 800 ml  Net -570 ml    Exam Awake , oriented x1, frail, demented, interactive but confused . Symmetrical Chest wall movement, Good air movement bilaterally, CTAB RRR,No Gallops,Rubs or new Murmurs, No Parasternal Heave +ve B.Sounds, Abd Soft, Non tender, No rebound -guarding or rigidity. No Cyanosis, Clubbing or edema, No new Rash or bruise  Data Review   CBC w Diff:  Lab Results  Component Value Date   WBC 6.3 11/22/2019   HGB 10.0 (L) 11/22/2019   HCT 30.8 (L) 11/22/2019   PLT 234 11/22/2019   LYMPHOPCT 24 11/22/2019   MONOPCT 10 11/22/2019   EOSPCT 5 11/22/2019   BASOPCT 1 11/22/2019    CMP:  Lab Results  Component Value Date   NA 139 11/22/2019   K 4.1 11/22/2019   CL 111 11/22/2019   CO2 18 (L) 11/22/2019   BUN 8 11/22/2019   CREATININE  0.70 11/22/2019   PROT 5.9 (L) 11/22/2019   ALBUMIN 2.5 (L) 11/22/2019   BILITOT 0.3 11/22/2019   ALKPHOS 70 11/22/2019   AST 36 11/22/2019   ALT 23 11/22/2019  .   Total Time in preparing paper work, data evaluation and todays exam - 54 minutes  Phillips Climes M.D on 11/22/2019 at 11:31 AM  [facility]   Office  [phone]

## 2019-11-22 NOTE — Care Management Important Message (Signed)
Important Message  Patient Details  Name: Robin Moreno MRN: [id number] Date of Birth: 13-Apr-1925   Medicare Important Message Given:  Yes - Important Message mailed due to current National Emergency  Verbal consent obtained due to current National Emergency  Relationship to patient: Child Contact Name: Omar Orrego Call Date: 11/22/19  Time: 1214 Phone: [phone] Outcome: Spoke with contact Important Message mailed to: Patient address on file    Orson Aloe 11/22/2019, 12:14 PM

## 2019-11-22 NOTE — Progress Notes (Addendum)
Physical Therapy Treatment Patient Details Name: Robin Moreno MRN: [id number] DOB: [birth year] Today's Date: 11/22/2019    History of Present Illness Pt is a 86 y/o female admitted 11/17/19 after found unresponsive in her home hospital bed with head caught between rails (neck compressed), and with agonal breathing; pt intubated in field. Admission COVID 19 PCR positive. Pt has h/o COVID 19 in 07/2019. 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 Other PMH includes arthritis, CVA, HTN, OA of R knee, CKD, THA.   PT Comments    Pt continues to require totalA+2 for bed mobility and sitting upright. Pleasantly confused throughout session; VSS on RA. Per chart, family declining SNF, pt will return home. Recommend DME listed below.    Follow Up Recommendations  SNF;Supervision/Assistance - 24 hour (family declined SNF)     Equipment Recommendations  Other (comment) (bariatric-sized wheelchair, 3in1, hospital bed, hoyer lift)    Recommendations for Other Services       Precautions / Restrictions Precautions Precautions: Fall Restrictions Weight Bearing Restrictions: No    Mobility  Bed Mobility Overal bed mobility: Needs Assistance Bed Mobility: Supine to Sit;Sit to Supine;Rolling Rolling: Total assist;+2 for physical assistance   Supine to sit: Total assist;+2 for physical assistance Sit to supine: Total assist;+2 for physical assistance   General bed mobility comments: TotalA+2 to sit EOB, pt with significant R-side lateral and posterior lean with L hip sliding from bed despite feet on floor, totalA+2 return to supine. TotalA+2 to roll R/L for repositioning. Pt not resisting movement, but doing nothing to assist  Transfers                    Ambulation/Gait                 Stairs             Wheelchair Mobility    Modified Rankin (Stroke Patients Only)       Balance Overall balance assessment: Needs assistance Sitting-balance  support: Feet supported Sitting balance-Leahy Scale: Zero   Postural control: Posterior lean;Right lateral lean                                  Cognition Arousal/Alertness: Awake/alert Behavior During Therapy: Flat affect Overall Cognitive Status: No family/caregiver present to determine baseline cognitive functioning                                 General Comments: H/o dementia. Pleasantly confused. Pt inconsistently following simple commands but requiring max multimodal cues to do so. Difficult to determine apparent cognitive impairment vs. pt's baseline + very HOH.      Exercises      General Comments General comments (skin integrity, edema, etc.): VSS on RA      Pertinent Vitals/Pain Pain Assessment: Faces Faces Pain Scale: Hurts little more Pain Location: Neck w/ L-side rotation Pain Descriptors / Indicators: Grimacing;Guarding;Moaning Pain Intervention(s): Monitored during session;Repositioned    Home Living                      Prior Function            PT Goals (current goals can now be found in the care plan section) Progress towards PT goals: Not progressing toward goals - comment (Limited by cognition)    Frequency  Min 2X/week      PT Plan Current plan remains appropriate    Co-evaluation              AM-PAC PT "6 Clicks" Mobility   Outcome Measure  Help needed turning from your back to your side while in a flat bed without using bedrails?: Total Help needed moving from lying on your back to sitting on the side of a flat bed without using bedrails?: Total Help needed moving to and from a bed to a chair (including a wheelchair)?: Total Help needed standing up from a chair using your arms (e.g., wheelchair or bedside chair)?: Total Help needed to walk in hospital room?: Total Help needed climbing 3-5 steps with a railing? : Total 6 Click Score: 6    End of Session   Activity Tolerance: Patient  tolerated treatment well Patient left: in bed;with call bell/phone within reach;with bed alarm set (with SLP present) Nurse Communication: Mobility status;Need for lift equipment PT Visit Diagnosis: Other abnormalities of gait and mobility (R26.89);Muscle weakness (generalized) (M62.81);Other symptoms and signs involving the nervous system (R29.898);Adult, failure to thrive (R62.7)     Time: 4469-5072 PT Time Calculation (min) (ACUTE ONLY): 28 min  Charges:  $Therapeutic Activity: 8-22 mins            Ina Homes, PT, DPT Acute Rehabilitation Services  Pager [phone] Office [phone]  Malachy Chamber 11/22/2019, 12:30 PM

## 2019-11-22 NOTE — Progress Notes (Signed)
  Speech Language Pathology Treatment: Dysphagia  Patient Details Name: Robin Moreno MRN: [id number] DOB: [birth year] Today's Date: 11/22/2019 Time: 9563-8756 SLP Time Calculation (min) (ACUTE ONLY): 20 min  Assessment / Plan / Recommendation Clinical Impression  Pt's swallow ability has improved in tandem with her alertness and ability to interact. Pleasantly confused and agreeable to trials thin liquid with hand over hand straw sips. Immediate and inconsistent subtle throat clear that - pt did not appear to be aspirating although unable to fully detect. No coughing compared to prior session where she required thick liquids. She needs full supervision and assist. She reports her dentures are at "home" although she did masticate cracker with additional time. Recommend continuing puree and continued ST for upgrade; SLP upgraded liquids to thin today; straws allowed with small sips, crush pills. Continue full supervision.    HPI HPI: 86 y/o female with hx of HTN, CVA, HTN, CKD, Covid + 07/2019, degenerative disk disease. Pt found unresponsive in her home hospital bed with head caught between rails (neck compressed), agonal breathing.  Intubated 8/21-8/22. Found to be COVID 19 PCR positive.  MRI senescent changes and small remote left frontal parietal cortex. BSE 08/2019 with + s/s aspiration with thin via straw and prolonged mastication. SLP recommended Dys 3 thin, no straws.       SLP Plan  Continue with current plan of care       Recommendations  Diet recommendations: Dysphagia 1 (puree);Thin liquid Liquids provided via: Cup;Straw Medication Administration: Crushed with puree Supervision: Patient able to self feed;Staff to assist with self feeding;Full supervision/cueing for compensatory strategies Compensations: Slow rate;Small sips/bites;Clear throat intermittently Postural Changes and/or Swallow Maneuvers: Seated upright 90 degrees                Oral Care Recommendations: Oral care  BID Follow up Recommendations: Skilled Nursing facility SLP Visit Diagnosis: Dysphagia, unspecified (R13.10) Plan: Continue with current plan of care                      [name] 11/22/2019, 10:56 AM  Robin Moreno Nurse, children's [phone] Office [phone]

## 2019-11-22 NOTE — TOC Progression Note (Signed)
Transition of Care [facility]) - Progression Note    Patient Details  Name: MOZELLE REMLINGER MRN: [id number] Date of Birth: 22-Oct-1925  Transition of [facility]) CM/SW Contact  Lockie Pares, RN Phone Number: 11/22/2019, 10:15 AM  Clinical Narrative:    Patient is active with [facility] Reordered PT OT aide and social work services . Called and left message with Camie Patience from [facility].    Expected Discharge Plan: Home w Home Health Services Barriers to Discharge: Continued Medical Work up  Expected Discharge Plan and Services Expected Discharge Plan: Home w Home Health Services In-house Referral: Clinical Social Work Discharge Planning Services: CM Consult Post Acute Care Choice: Home Health, Durable Medical Equipment Living arrangements for the past 2 months: Single Family Home Expected Discharge Date: 11/22/19               DME Arranged: Hospital bed (hoyer) DME Agency: [employer] Date DME Agency Contacted: 11/20/19 Time DME Agency Contacted: 1341 Representative spoke with at DME Agency: [email or web address]             Social Determinants of Health (SDOH) Interventions    Readmission Risk Interventions No flowsheet data found.

## 2019-11-22 NOTE — Care Management (Signed)
PTAR called for patient 

## 2019-11-23 ENCOUNTER — Emergency Department (HOSPITAL_COMMUNITY): Payer: Medicare Other

## 2019-11-23 ENCOUNTER — Inpatient Hospital Stay (HOSPITAL_COMMUNITY)
Admission: EM | Admit: 2019-11-23 | Discharge: 2019-11-26 | DRG: 070 | Disposition: A | Discharge status: Hospice | Payer: Medicare Other | Attending: Internal Medicine | Admitting: Internal Medicine

## 2019-11-23 DIAGNOSIS — N1832 Chronic kidney disease, stage 3b: Secondary | ICD-10-CM | POA: Diagnosis present

## 2019-11-23 DIAGNOSIS — U071 COVID-19: Secondary | ICD-10-CM

## 2019-11-23 DIAGNOSIS — Z789 Other specified health status: Secondary | ICD-10-CM

## 2019-11-23 DIAGNOSIS — L89152 Pressure ulcer of sacral region, stage 2: Secondary | ICD-10-CM | POA: Diagnosis present

## 2019-11-23 DIAGNOSIS — E785 Hyperlipidemia, unspecified: Secondary | ICD-10-CM | POA: Diagnosis not present

## 2019-11-23 DIAGNOSIS — Z515 Encounter for palliative care: Secondary | ICD-10-CM | POA: Diagnosis present

## 2019-11-23 DIAGNOSIS — I4891 Unspecified atrial fibrillation: Secondary | ICD-10-CM | POA: Diagnosis present

## 2019-11-23 DIAGNOSIS — Z7401 Bed confinement status: Secondary | ICD-10-CM

## 2019-11-23 DIAGNOSIS — R627 Adult failure to thrive: Secondary | ICD-10-CM | POA: Diagnosis present

## 2019-11-23 DIAGNOSIS — I2699 Other pulmonary embolism without acute cor pulmonale: Secondary | ICD-10-CM

## 2019-11-23 DIAGNOSIS — Z7189 Other specified counseling: Secondary | ICD-10-CM

## 2019-11-23 DIAGNOSIS — Z79899 Other long term (current) drug therapy: Secondary | ICD-10-CM

## 2019-11-23 DIAGNOSIS — Z8673 Personal history of transient ischemic attack (TIA), and cerebral infarction without residual deficits: Secondary | ICD-10-CM

## 2019-11-23 DIAGNOSIS — Z8616 Personal history of COVID-19: Secondary | ICD-10-CM

## 2019-11-23 DIAGNOSIS — L89626 Pressure-induced deep tissue damage of left heel: Secondary | ICD-10-CM | POA: Diagnosis present

## 2019-11-23 DIAGNOSIS — Z711 Person with feared health complaint in whom no diagnosis is made: Secondary | ICD-10-CM

## 2019-11-23 DIAGNOSIS — G9340 Encephalopathy, unspecified: Principal | ICD-10-CM | POA: Diagnosis present

## 2019-11-23 DIAGNOSIS — F0390 Unspecified dementia without behavioral disturbance: Secondary | ICD-10-CM | POA: Diagnosis present

## 2019-11-23 DIAGNOSIS — R4182 Altered mental status, unspecified: Secondary | ICD-10-CM | POA: Diagnosis present

## 2019-11-23 DIAGNOSIS — L89629 Pressure ulcer of left heel, unspecified stage: Secondary | ICD-10-CM | POA: Diagnosis present

## 2019-11-23 DIAGNOSIS — I10 Essential (primary) hypertension: Secondary | ICD-10-CM

## 2019-11-23 DIAGNOSIS — L89619 Pressure ulcer of right heel, unspecified stage: Secondary | ICD-10-CM | POA: Diagnosis present

## 2019-11-23 DIAGNOSIS — Z7901 Long term (current) use of anticoagulants: Secondary | ICD-10-CM

## 2019-11-23 DIAGNOSIS — Z66 Do not resuscitate: Secondary | ICD-10-CM | POA: Diagnosis present

## 2019-11-23 DIAGNOSIS — I129 Hypertensive chronic kidney disease with stage 1 through stage 4 chronic kidney disease, or unspecified chronic kidney disease: Secondary | ICD-10-CM | POA: Diagnosis present

## 2019-11-23 DIAGNOSIS — R400 Somnolence: Secondary | ICD-10-CM

## 2019-11-23 DIAGNOSIS — L89616 Pressure-induced deep tissue damage of right heel: Secondary | ICD-10-CM | POA: Diagnosis present

## 2019-11-23 LAB — CBC WITH DIFFERENTIAL/PLATELET
Abs Immature Granulocytes: 0.24 10*3/uL — ABNORMAL HIGH (ref 0.00–0.07)
Basophils Absolute: 0 10*3/uL (ref 0.0–0.1)
Basophils Relative: 1 %
Eosinophils Absolute: 0.2 10*3/uL (ref 0.0–0.5)
Eosinophils Relative: 4 %
HCT: 33.9 % — ABNORMAL LOW (ref 36.0–46.0)
Hemoglobin: 10.3 g/dL — ABNORMAL LOW (ref 12.0–15.0)
Immature Granulocytes: 4 %
Lymphocytes Relative: 31 %
Lymphs Abs: 1.8 10*3/uL (ref 0.7–4.0)
MCH: 29 pg (ref 26.0–34.0)
MCHC: 30.4 g/dL (ref 30.0–36.0)
MCV: 95.5 fL (ref 80.0–100.0)
Monocytes Absolute: 0.5 10*3/uL (ref 0.1–1.0)
Monocytes Relative: 9 %
Neutro Abs: 3 10*3/uL (ref 1.7–7.7)
Neutrophils Relative %: 51 %
Platelets: 304 10*3/uL (ref 150–400)
RBC: 3.55 MIL/uL — ABNORMAL LOW (ref 3.87–5.11)
RDW: 13.9 % (ref 11.5–15.5)
WBC: 5.8 10*3/uL (ref 4.0–10.5)
nRBC: 0.3 % — ABNORMAL HIGH (ref 0.0–0.2)

## 2019-11-23 LAB — CULTURE, BLOOD (ROUTINE X 2)
Culture: NO GROWTH
Culture: NO GROWTH
Special Requests: ADEQUATE

## 2019-11-23 LAB — URINALYSIS, ROUTINE W REFLEX MICROSCOPIC
Bilirubin Urine: NEGATIVE
Glucose, UA: NEGATIVE mg/dL
Hgb urine dipstick: NEGATIVE
Ketones, ur: NEGATIVE mg/dL
Leukocytes,Ua: NEGATIVE
Nitrite: NEGATIVE
Protein, ur: NEGATIVE mg/dL
Specific Gravity, Urine: 1.011 (ref 1.005–1.030)
pH: 5 (ref 5.0–8.0)

## 2019-11-23 LAB — COMPREHENSIVE METABOLIC PANEL
ALT: 26 U/L (ref 0–44)
AST: 37 U/L (ref 15–41)
Albumin: 2.7 g/dL — ABNORMAL LOW (ref 3.5–5.0)
Alkaline Phosphatase: 72 U/L (ref 38–126)
Anion gap: 10 (ref 5–15)
BUN: 7 mg/dL — ABNORMAL LOW (ref 8–23)
CO2: 20 mmol/L — ABNORMAL LOW (ref 22–32)
Calcium: 9.3 mg/dL (ref 8.9–10.3)
Chloride: 111 mmol/L (ref 98–111)
Creatinine, Ser: 0.84 mg/dL (ref 0.44–1.00)
GFR calc Af Amer: >60 mL/min (ref >60)
GFR calc non Af Amer: >60 mL/min (ref >60)
Glucose, Bld: 105 mg/dL — ABNORMAL HIGH (ref 70–99)
Potassium: 3.6 mmol/L (ref 3.5–5.1)
Sodium: 141 mmol/L (ref 135–145)
Total Bilirubin: 0.8 mg/dL (ref 0.3–1.2)
Total Protein: 6.2 g/dL — ABNORMAL LOW (ref 6.5–8.1)

## 2019-11-23 LAB — TROPONIN I (HIGH SENSITIVITY): Troponin I (High Sensitivity): 19 ng/L — ABNORMAL HIGH (ref <18)

## 2019-11-23 NOTE — ED Triage Notes (Signed)
Pt BIB [facility] after son c/o increasing AMS x 2 days. PT recently d/c from [facility] after fall with thinners. Son also reported to EMS that pt has increase lethargy over last few days. Upon arrival, pt responds to voice.   HR 79 RR 24 96% RA Rectal temp 98.1 166/86

## 2019-11-23 NOTE — ED Provider Notes (Signed)
Robin Moreno Provider Note  CSN: [id number] Arrival date & time: 11/23/19 1536    History Chief Complaint  Patient presents with  . Altered Mental Status    HPI  Robin Moreno is a 86 y.o. female brought to the ED via EMS from home. Patient was discharge yesterday after an admission for AMS, she was apparently found by her son with her head wedged between her bed and nightstand, EMS intubated her for agonal respirations. She was extubated within 24 hours but her admission was complicated by positive Covid-19 (had been positive in May 2021 also), atrial fibrilliation and small distal PE. She was started on metoprolol and Eliquis, CT and MRI head were negative then. She was back to her baseline yesterday when her son took her home. Per son, she was awake and talking last night, at some soup for dinner and took her evening medications including melatonin for insomnia. This morning the son was unable to wake her up and thought she might just still be sleeping, but she continued to be minimally responsive as the day went on and still had not woken up to her baseline by 2pm and so he was advised his by PCP to call EMS. Patient does not provide any additional history.    Past Medical History:  Diagnosis Date  . Arthritis   . CVA (cerebral vascular accident) (HCC)   . Degenerative disk disease   . Hypercholesterolemia   . Hypertension   . Osteoarthritis of right knee   . Renal disorder   . Stage 3a chronic kidney disease 09/01/2019    Past Surgical History:  Procedure Laterality Date  . ABDOMINAL HYSTERECTOMY    . TOTAL HIP ARTHROPLASTY      No family history on file.  Social History   Tobacco Use  . Smoking status: Never Smoker  . Smokeless tobacco: Never Used  Vaping Use  . Vaping Use: Never used  Substance Use Topics  . Alcohol use: No  . Drug use: No     Home Medications Prior to Admission medications   Medication Sig Start Date End Date Taking?  Authorizing Provider  acetaminophen (TYLENOL) 325 MG tablet Take 2 tablets (650 mg total) by mouth every 6 (six) hours as needed. Patient taking differently: Take 650 mg by mouth every 6 (six) hours as needed for mild pain or headache.  01/04/19   Derwood Kaplan, MD  albuterol (VENTOLIN HFA) 108 (90 Base) MCG/ACT inhaler Inhale 1-2 puffs into the lungs every 4 (four) hours as needed for wheezing or shortness of breath.     [provider]  apixaban (ELIQUIS) 5 MG TABS tablet Please take 10 mg oral twice daily for next 5 days, then transition to 5 mg oral twice daily from 11/28/2019 11/22/19   Elgergawy, Leana Roe, MD  Ascorbic Acid (VITAMIN C PO) Take 1 tablet by mouth daily.    [provider]  baclofen (LIORESAL) 10 MG tablet Take 10 mg by mouth 2 (two) times daily. 11/16/19   [provider]  Cholecalciferol (VITAMIN D) 2000 UNITS tablet Take 2,000 Units by mouth daily.      [provider]  fluticasone (FLONASE) 50 MCG/ACT nasal spray Place 1 spray into both nostrils daily as needed for allergies or rhinitis.  11/04/19   [provider]  gabapentin (NEURONTIN) 100 MG capsule Take 1 capsule (100 mg total) by mouth 3 (three) times daily. 09/02/19   Burnadette Pop, MD  meclizine (ANTIVERT) 12.5 MG  tablet Take 1 tablet (12.5 mg total) by mouth 3 (three) times daily as needed for dizziness. 07/23/18   Pricilla Loveless, MD  metoprolol tartrate (LOPRESSOR) 25 MG tablet Take 1 tablet (25 mg total) by mouth 2 (two) times daily. 11/22/19   Elgergawy, Leana Roe, MD  Multiple Vitamins-Minerals (ZINC PO) Take 1 tablet by mouth daily.    [provider]  pantoprazole (PROTONIX) 40 MG tablet Take 1 tablet (40 mg total) by mouth daily. 11/23/19   Elgergawy, Leana Roe, MD  Polyethyl Glycol-Propyl Glycol (SYSTANE OP) Place 1 drop into both eyes every morning.    [provider]  rosuvastatin (CRESTOR) 10 MG tablet Take 10 mg by mouth daily. 09/19/19   [provider]     Allergies    Patient has no known allergies.   Review of Systems   Review of Systems Unable to assess due to mental status.    Physical Exam BP (!) 170/87   Pulse 81   Temp 98.1 F (36.7 C) (Rectal)   Resp (!) 24   SpO2 99%   Physical Exam Vitals and nursing note reviewed.  Constitutional:      Appearance: Normal appearance.  HENT:     Head: Normocephalic and atraumatic.     Nose: Nose normal.     Mouth/Throat:     Mouth: Mucous membranes are moist.  Eyes:     Conjunctiva/sclera: Conjunctivae normal.     Pupils: Pupils are equal, round, and reactive to light.  Cardiovascular:     Rate and Rhythm: Normal rate and regular rhythm.  Pulmonary:     Effort: Pulmonary effort is normal.     Breath sounds: Normal breath sounds.  Abdominal:     General: Abdomen is flat.     Palpations: Abdomen is soft.     Tenderness: There is no abdominal tenderness.  Musculoskeletal:        General: Swelling (mild edema BLE) present. Normal range of motion.     Cervical back: Neck supple.  Skin:    General: Skin is warm and dry.  Neurological:     Comments: Unresponsive to verbal stimuli, occasionally opens her eyes for a few seconds      ED Results / Procedures / Treatments   Labs (all labs ordered are listed, but only abnormal results are displayed) Labs Reviewed  COMPREHENSIVE METABOLIC PANEL - Abnormal; Notable for the following components:      Result Value   CO2 20 (*)    Glucose, Bld 105 (*)    BUN 7 (*)    Total Protein 6.2 (*)    Albumin 2.7 (*)    All other components within normal limits  CBC WITH DIFFERENTIAL/PLATELET - Abnormal; Notable for the following components:   RBC 3.55 (*)    Hemoglobin 10.3 (*)    HCT 33.9 (*)    nRBC 0.3 (*)    Abs Immature Granulocytes 0.24 (*)    All other components within normal limits  TROPONIN I (HIGH SENSITIVITY) - Abnormal; Notable for the following components:   Troponin I (High Sensitivity) 19 (*)     All other components within normal limits  URINALYSIS, ROUTINE W REFLEX MICROSCOPIC  TROPONIN I (HIGH SENSITIVITY)    EKG EKG Interpretation  Date/Time:  Friday November 23 2019 15:54:00 EDT Ventricular Rate:  71 PR Interval:    QRS Duration: 82 QT Interval:  403 QTC Calculation: 438 R Axis:   40 Text Interpretation: Sinus rhythm Prolonged PR interval  Abnormal R-wave progression, early transition Borderline T abnormalities, diffuse leads Since last tracing Sinus rhythm has replaced Atrial fibrillation Confirmed by Susy Frizzle [phone]) on 11/23/2019 4:29:31 PM   Radiology CT Head Wo Contrast  Result Date: 11/23/2019 CLINICAL DATA:  Altered mental status. Progressive altered mental status for 2 days. Recent hospital discharge after fall. EXAM: CT HEAD WITHOUT CONTRAST TECHNIQUE: Contiguous axial images were obtained from the base of the skull through the vertex without intravenous contrast. COMPARISON:  Head CT 11/17/2019, brain MRI 10/29/2019 FINDINGS: Brain: Mild motion artifact limitations on the current exam. Stable degree of atrophy and chronic small vessel ischemia. Again seen remote left frontoparietal infarct. No intracranial hemorrhage, mass effect, or midline shift. No hydrocephalus. The basilar cisterns are patent. No evidence of territorial infarct or acute ischemia. No extra-axial or intracranial fluid collection. Vascular: Atherosclerosis of skullbase vasculature without hyperdense vessel or abnormal calcification. Skull: No fracture or focal lesion. Sinuses/Orbits: Paranasal sinuses and mastoid air cells are clear. The visualized orbits are unremarkable. Bilateral cataract resection. Other: None. IMPRESSION: 1. No acute intracranial abnormality, allowing for motion artifact on the current exam. 2. Stable atrophy, chronic small vessel ischemia, and remote left frontoparietal infarct. Electronically Signed   By: Narda Rutherford M.D.   On: 11/23/2019 19:03   DG Chest Port 1  View  Result Date: 11/23/2019 CLINICAL DATA:  Altered mental status. EXAM: PORTABLE CHEST 1 VIEW COMPARISON:  November 17, 2019 FINDINGS: The endotracheal tube and nasogastric tube seen on the prior study have been removed. Multiple overlying cardiac lead wires are seen. Mild, diffuse chronic appearing increased interstitial lung markings are noted. There is no evidence of acute infiltrate, pleural effusion or pneumothorax. The heart size and mediastinal contours are within normal limits. Degenerative changes seen throughout the thoracic spine. IMPRESSION: Chronic appearing increased interstitial lung markings without evidence of acute or active cardiopulmonary disease. Electronically Signed   By: Aram Candela M.D.   On: 11/23/2019 17:20    Procedures Procedures  Medications Ordered in the ED Medications - No data to display   MDM Rules/Calculators/A&P MDM Patient with recent admission, home for ~24hrs now with persistent somnolence today. Will check labs, CT and CXR.  ED Course  I have reviewed the triage vital signs and the nursing notes.  Pertinent labs & imaging results that were available during my care of the patient were reviewed by me and considered in my medical decision making (see chart for details).  Clinical Course as of Nov 22 2237  Fri Nov 23, 2019  1724 CXR unchanged   [CS]  1934 CBC is at baseline. UA neg for infection. No concerning findings on BMP. CT head reviewed, no acute process.    [CS]  2059 Patient observed in the ED for several hours, no change in mental status, opens eyes, but does not speak or follow commands as per her baseline. Unclear etiology of her new AMS. Will discuss with Hospitalist.    [CS]  2123 Spoke with Dr. Joneen Roach, Hospitalist who will evaluate the patient in the ED.    [CS]    Clinical Course User Index [CS] Pollyann Savoy, MD    Final Clinical Impression(s) / ED Diagnoses Final diagnoses:  Altered mental status, unspecified  altered mental status type    Rx / DC Orders ED Discharge Orders    None       Pollyann Savoy, MD 11/23/19 2239

## 2019-11-23 NOTE — ED Notes (Signed)
Son Korra Christine would like to be informed of any updates regarding his mother  [phone]

## 2019-11-23 NOTE — H&P (Addendum)
PCP:   Ralene Ok, MD   Chief Complaint:  Somnolence  HPI: This is a 86 year old female who was just discharged she yesterday. She was found in a unresponsive state at home with agonal breathing. She was intubation for airway protection, extubated within 24 hours by PCCM. She was also found to have COVID infection.  Chest x-ray was negative.  She was clear and oriented at the time of discharge but thought to have some underlying mild dementia.  Per son patient was good when discharged but he was unable to wake her up this morning.  He let her sleep but when he was still unable to wake her at [facility] he decided to take her to the ER.  Per ER physician he was told by the son that despite being unable to fully awaken his mother he did give her home meds along with some cough medications. On presentation to the ER she was mildly arousable but still sleeping.  During my interview patient is nontoxic.  She arouses with little stimulation.  She does go immediately back to sleep.  She is not talkative.  Review of Systems:  Unable to obtain due to patient not being very talkative  Past Medical History: Past Medical History:  Diagnosis Date  . Arthritis   . CVA (cerebral vascular accident) (HCC)   . Degenerative disk disease   . Hypercholesterolemia   . Hypertension   . Osteoarthritis of right knee   . Renal disorder   . Stage 3a chronic kidney disease 09/01/2019   Past Surgical History:  Procedure Laterality Date  . ABDOMINAL HYSTERECTOMY    . TOTAL HIP ARTHROPLASTY      Medications: Prior to Admission medications   Medication Sig Start Date End Date Taking? Authorizing Provider  acetaminophen (TYLENOL) 325 MG tablet Take 2 tablets (650 mg total) by mouth every 6 (six) hours as needed. Patient taking differently: Take 650 mg by mouth every 6 (six) hours as needed for mild pain or headache.  01/04/19   Derwood Kaplan, MD  albuterol (VENTOLIN HFA) 108 (90 Base) MCG/ACT inhaler Inhale 1-2 puffs  into the lungs every 4 (four) hours as needed for wheezing or shortness of breath.     [provider]  apixaban (ELIQUIS) 5 MG TABS tablet Please take 10 mg oral twice daily for next 5 days, then transition to 5 mg oral twice daily from 11/28/2019 11/22/19   Elgergawy, Leana Roe, MD  Ascorbic Acid (VITAMIN C PO) Take 1 tablet by mouth daily.    [provider]  baclofen (LIORESAL) 10 MG tablet Take 10 mg by mouth 2 (two) times daily. 11/16/19   [provider]  Cholecalciferol (VITAMIN D) 2000 UNITS tablet Take 2,000 Units by mouth daily.      [provider]  fluticasone (FLONASE) 50 MCG/ACT nasal spray Place 1 spray into both nostrils daily as needed for allergies or rhinitis.  11/04/19   [provider]  gabapentin (NEURONTIN) 100 MG capsule Take 1 capsule (100 mg total) by mouth 3 (three) times daily. 09/02/19   Burnadette Pop, MD  meclizine (ANTIVERT) 12.5 MG tablet Take 1 tablet (12.5 mg total) by mouth 3 (three) times daily as needed for dizziness. 07/23/18   Pricilla Loveless, MD  metoprolol tartrate (LOPRESSOR) 25 MG tablet Take 1 tablet (25 mg total) by mouth 2 (two) times daily. 11/22/19   Elgergawy, Leana Roe, MD  Multiple Vitamins-Minerals (ZINC PO) Take 1 tablet by mouth daily.    [provider]  pantoprazole (PROTONIX) 40 MG tablet Take 1 tablet (40 mg total) by mouth daily. 11/23/19   Elgergawy, Leana Roe, MD  Polyethyl Glycol-Propyl Glycol (SYSTANE OP) Place 1 drop into both eyes every morning.    [provider]  rosuvastatin (CRESTOR) 10 MG tablet Take 10 mg by mouth daily. 09/19/19   [provider]    Allergies:  No Known Allergies  Social History:  reports that she has never smoked. She has never used smokeless tobacco. She reports that she does not drink alcohol and does not use drugs.  Family History: HTN  Physical Exam: Vitals:   11/23/19 1815 11/23/19 1830 11/23/19 2030 11/23/19 2045  BP: (!) 174/90 (!)  168/80 (!) 185/79 (!) 172/93  Pulse:    84  Resp: (!) 23 20 (!) 24 (!) 21  Temp:      TempSrc:      SpO2:    99%    General:  Alert, unclear if patient is oriented, well developed and nourished, no acute distress. Nontoxic  Eyes: PERRLA, pink conjunctiva, no scleral icterus ENT: Moist oral mucosa, neck supple, no thyromegaly Lungs: clear to ascultation, no wheeze, no crackles, no use of accessory muscles Cardiovascular: regular rate and rhythm, no regurgitation, no gallops, no murmurs. No carotid bruits, no JVD Abdomen: soft, positive BS, non-tender, non-distended, no organomegaly, not an acute abdomen GU: not examined Neuro: CN II - XII grossly intact, sensation intact Musculoskeletal: strength 5/5 all extremities, no clubbing, cyanosis or edema Skin: no rash, no subcutaneous crepitation, sacral ulcer, right and left heel ulcers Psych: Somnolent but arousable patient   Labs on Admission:  Recent Labs    11/21/19 0726 11/21/19 0726 11/22/19 0214 11/23/19 1830  NA 141   < > 139 141  K 3.1*   < > 4.1 3.6  CL 113*   < > 111 111  CO2 16*   < > 18* 20*  GLUCOSE 126*   < > 119* 105*  BUN 8   < > 8 7*  CREATININE 0.70   < > 0.70 0.84  CALCIUM 8.9   < > 8.9 9.3  MG 1.6*  --  2.0  --    < > = values in this interval not displayed.   Recent Labs    11/22/19 0214 11/23/19 1830  AST 36 37  ALT 23 26  ALKPHOS 70 72  BILITOT 0.3 0.8  PROT 5.9* 6.2*  ALBUMIN 2.5* 2.7*   No results for input(s): LIPASE, AMYLASE in the last 72 hours. Recent Labs    11/22/19 0214 11/23/19 1830  WBC 6.3 5.8  NEUTROABS 3.7 3.0  HGB 10.0* 10.3*  HCT 30.8* 33.9*  MCV 93.1 95.5  PLT 234 304   No results for input(s): CKTOTAL, CKMB, CKMBINDEX, TROPONINI in the last 72 hours. Invalid input(s): POCBNP Recent Labs    11/21/19 0726 11/22/19 0214  DDIMER 3.97* 2.93*   No results for input(s): HGBA1C in the last 72 hours. No results for input(s): CHOL, HDL, LDLCALC, TRIG, CHOLHDL, LDLDIRECT  in the last 72 hours. Recent Labs    11/21/19 0726  TSH 2.173   No results for input(s): VITAMINB12, FOLATE, FERRITIN, TIBC, IRON, RETICCTPCT in the last 72 hours.  Micro Results: Recent Results (from the past 240 hour(s))  SARS Coronavirus 2 by RT PCR (hospital order, performed in [facility] hospital lab) Nasopharyngeal Nasopharyngeal Swab     Status: Abnormal   Collection Time: 11/16/19  5:02 PM   Specimen:  Nasopharyngeal Swab  Result Value Ref Range Status   SARS Coronavirus 2 POSITIVE (A) NEGATIVE Final    Comment: RESULT CALLED TO, READ BACK BY AND VERIFIED WITH: L MEEKS RN 11/16/19 AT 1907 SK (NOTE) SARS-CoV-2 target nucleic acids are DETECTED  SARS-CoV-2 RNA is generally detectable in upper respiratory specimens  during the acute phase of infection.  Positive results are indicative  of the presence of the identified virus, but do not rule out bacterial infection or co-infection with other pathogens not detected by the test.  Clinical correlation with patient history and  other diagnostic information is necessary to determine patient infection status.  The expected result is negative.  Fact Sheet for Patients:   BoilerBrush.com.cy   Fact Sheet for Healthcare Providers:   https://pope.com/    This test is not yet approved or cleared by the [place] FDA and  has been authorized for detection and/or diagnosis of SARS-CoV-2 by FDA under an Emergency Use Authorization (EUA).  This EUA will remain in effect (meaning this test ca n be used) for the duration of  the COVID-19 declaration under Section 564(b)(1) of the Act, 21 U.S.C. section 360-bbb-3(b)(1), unless the authorization is terminated or revoked sooner.  Performed at [facility], [address], [place], [place] [address]   MRSA PCR Screening     Status: None   Collection Time: 11/18/19  1:01 AM   Specimen: Urine, Catheterized; Nasopharyngeal  Result  Value Ref Range Status   MRSA by PCR NEGATIVE NEGATIVE Final    Comment:        The GeneXpert MRSA Assay (FDA approved for NASAL specimens only), is one component of a comprehensive MRSA colonization surveillance program. It is not intended to diagnose MRSA infection nor to guide or monitor treatment for MRSA infections. Performed at [facility], [address] [place], [place] [address]   Culture, blood (routine x 2)     Status: None   Collection Time: 11/18/19 10:29 AM   Specimen: BLOOD  Result Value Ref Range Status   Specimen Description BLOOD RIGHT ANTECUBITAL  Final   Special Requests   Final    BOTTLES DRAWN AEROBIC AND ANAEROBIC Blood Culture results may not be optimal due to an inadequate volume of blood received in culture bottles   Culture   Final    NO GROWTH 5 DAYS Performed at [facility], [address] [place], [place] [address]    Report Status 11/23/2019 FINAL  Final  Culture, blood (routine x 2)     Status: None   Collection Time: 11/18/19 10:29 AM   Specimen: BLOOD RIGHT HAND  Result Value Ref Range Status   Specimen Description BLOOD RIGHT HAND  Final   Special Requests AEROBIC BOTTLE ONLY Blood Culture adequate volume  Final   Culture   Final    NO GROWTH 5 DAYS Performed at [facility], [address], [place], [place] [address]    Report Status 11/23/2019 FINAL  Final     Radiological Exams on Admission: CT Head Wo Contrast  Result Date: 11/23/2019 CLINICAL DATA:  Altered mental status. Progressive altered mental status for 2 days. Recent hospital discharge after fall. EXAM: CT HEAD WITHOUT CONTRAST TECHNIQUE: Contiguous axial images were obtained from the base of the skull through the vertex without intravenous contrast. COMPARISON:  Head CT 11/17/2019, brain MRI 10/29/2019 FINDINGS: Brain: Mild motion artifact limitations on the current exam. Stable degree of atrophy and chronic small  vessel ischemia. Again seen remote left  frontoparietal infarct. No intracranial hemorrhage, mass effect, or midline shift. No hydrocephalus. The basilar cisterns are patent. No evidence of territorial infarct or acute ischemia. No extra-axial or intracranial fluid collection. Vascular: Atherosclerosis of skullbase vasculature without hyperdense vessel or abnormal calcification. Skull: No fracture or focal lesion. Sinuses/Orbits: Paranasal sinuses and mastoid air cells are clear. The visualized orbits are unremarkable. Bilateral cataract resection. Other: None. IMPRESSION: 1. No acute intracranial abnormality, allowing for motion artifact on the current exam. 2. Stable atrophy, chronic small vessel ischemia, and remote left frontoparietal infarct. Electronically Signed   By: Narda Rutherford M.D.   On: 11/23/2019 19:03   DG Chest Port 1 View  Result Date: 11/23/2019 CLINICAL DATA:  Altered mental status. EXAM: PORTABLE CHEST 1 VIEW COMPARISON:  November 17, 2019 FINDINGS: The endotracheal tube and nasogastric tube seen on the prior study have been removed. Multiple overlying cardiac lead wires are seen. Mild, diffuse chronic appearing increased interstitial lung markings are noted. There is no evidence of acute infiltrate, pleural effusion or pneumothorax. The heart size and mediastinal contours are within normal limits. Degenerative changes seen throughout the thoracic spine. IMPRESSION: Chronic appearing increased interstitial lung markings without evidence of acute or active cardiopulmonary disease. Electronically Signed   By: Aram Candela M.D.   On: 11/23/2019 17:20    Assessment/Plan Present on Admission: . Somnolent -Unclear if patient is altered.  Bring in for overnight observation med telemetry -This may be due to unintentional polypharmacy.  Patient has baclofen and Neurontin on her med list.  She was also given cough medicine per ER report.  Patient has asymptomatic COVID-19.  Suspect baclofen, Neurontin and possible cough meds are  causing somnolence in this 86 year old Covid positive patient.  As stated patient is not toxic-appearing.  We will hold all of these medications and monitor.  Gentle IV fluid hydration. -PT/OT consult in AM  . COVID-19 -Resolved active COVID-19 infection from several months ago. Incidental Covid positive, cycle count over 42 no isolation needed.  -Per critical care note on prior admission she was cleared by infection prevention.  Pressure ulcers, sacrum, bilateral heels -Wound care consult.  Wound care to stage -Chronically bedbound patient  . Pulmonary embolism on right [facility]) -Diagnosed last admission.  D-dimer trending down -Stable, resume home meds  Atrial fibrillation -New diagnosis last admission.  Home meds resumed including Eliquis  . Dyslipidemia -Stable, home meds  Cianni Manny 11/23/2019, 9:43 PM

## 2019-11-24 DIAGNOSIS — Z7901 Long term (current) use of anticoagulants: Secondary | ICD-10-CM | POA: Diagnosis not present

## 2019-11-24 DIAGNOSIS — Z789 Other specified health status: Secondary | ICD-10-CM | POA: Diagnosis not present

## 2019-11-24 DIAGNOSIS — L89626 Pressure-induced deep tissue damage of left heel: Secondary | ICD-10-CM | POA: Diagnosis present

## 2019-11-24 DIAGNOSIS — L89629 Pressure ulcer of left heel, unspecified stage: Secondary | ICD-10-CM | POA: Diagnosis present

## 2019-11-24 DIAGNOSIS — Z711 Person with feared health complaint in whom no diagnosis is made: Secondary | ICD-10-CM

## 2019-11-24 DIAGNOSIS — R4182 Altered mental status, unspecified: Secondary | ICD-10-CM

## 2019-11-24 DIAGNOSIS — Z7189 Other specified counseling: Secondary | ICD-10-CM

## 2019-11-24 DIAGNOSIS — U071 COVID-19: Secondary | ICD-10-CM | POA: Diagnosis not present

## 2019-11-24 DIAGNOSIS — L89616 Pressure-induced deep tissue damage of right heel: Secondary | ICD-10-CM | POA: Diagnosis present

## 2019-11-24 DIAGNOSIS — L89619 Pressure ulcer of right heel, unspecified stage: Secondary | ICD-10-CM | POA: Diagnosis present

## 2019-11-24 DIAGNOSIS — R531 Weakness: Secondary | ICD-10-CM

## 2019-11-24 DIAGNOSIS — R627 Adult failure to thrive: Secondary | ICD-10-CM

## 2019-11-24 DIAGNOSIS — I4891 Unspecified atrial fibrillation: Secondary | ICD-10-CM | POA: Diagnosis present

## 2019-11-24 DIAGNOSIS — Z7401 Bed confinement status: Secondary | ICD-10-CM | POA: Diagnosis not present

## 2019-11-24 DIAGNOSIS — N1832 Chronic kidney disease, stage 3b: Secondary | ICD-10-CM | POA: Diagnosis present

## 2019-11-24 DIAGNOSIS — Z66 Do not resuscitate: Secondary | ICD-10-CM

## 2019-11-24 DIAGNOSIS — L89152 Pressure ulcer of sacral region, stage 2: Secondary | ICD-10-CM | POA: Diagnosis present

## 2019-11-24 DIAGNOSIS — Z515 Encounter for palliative care: Secondary | ICD-10-CM

## 2019-11-24 DIAGNOSIS — E785 Hyperlipidemia, unspecified: Secondary | ICD-10-CM | POA: Diagnosis present

## 2019-11-24 DIAGNOSIS — I129 Hypertensive chronic kidney disease with stage 1 through stage 4 chronic kidney disease, or unspecified chronic kidney disease: Secondary | ICD-10-CM | POA: Diagnosis present

## 2019-11-24 DIAGNOSIS — R400 Somnolence: Secondary | ICD-10-CM | POA: Diagnosis present

## 2019-11-24 DIAGNOSIS — F0390 Unspecified dementia without behavioral disturbance: Secondary | ICD-10-CM | POA: Diagnosis present

## 2019-11-24 DIAGNOSIS — Z8616 Personal history of COVID-19: Secondary | ICD-10-CM | POA: Diagnosis not present

## 2019-11-24 DIAGNOSIS — G9340 Encephalopathy, unspecified: Secondary | ICD-10-CM | POA: Diagnosis present

## 2019-11-24 DIAGNOSIS — I2699 Other pulmonary embolism without acute cor pulmonale: Secondary | ICD-10-CM | POA: Diagnosis present

## 2019-11-24 LAB — CBC
HCT: 34.6 % — ABNORMAL LOW (ref 36.0–46.0)
Hemoglobin: 10.7 g/dL — ABNORMAL LOW (ref 12.0–15.0)
MCH: 28.8 pg (ref 26.0–34.0)
MCHC: 30.9 g/dL (ref 30.0–36.0)
MCV: 93 fL (ref 80.0–100.0)
Platelets: 322 10*3/uL (ref 150–400)
RBC: 3.72 MIL/uL — ABNORMAL LOW (ref 3.87–5.11)
RDW: 13.8 % (ref 11.5–15.5)
WBC: 7.6 10*3/uL (ref 4.0–10.5)
nRBC: 0 % (ref 0.0–0.2)

## 2019-11-24 LAB — BASIC METABOLIC PANEL
Anion gap: 13 (ref 5–15)
BUN: 7 mg/dL — ABNORMAL LOW (ref 8–23)
CO2: 19 mmol/L — ABNORMAL LOW (ref 22–32)
Calcium: 9.3 mg/dL (ref 8.9–10.3)
Chloride: 107 mmol/L (ref 98–111)
Creatinine, Ser: 0.78 mg/dL (ref 0.44–1.00)
GFR calc Af Amer: >60 mL/min (ref >60)
GFR calc non Af Amer: >60 mL/min (ref >60)
Glucose, Bld: 105 mg/dL — ABNORMAL HIGH (ref 70–99)
Potassium: 3.5 mmol/L (ref 3.5–5.1)
Sodium: 139 mmol/L (ref 135–145)

## 2019-11-24 LAB — TROPONIN I (HIGH SENSITIVITY): Troponin I (High Sensitivity): 19 ng/L — ABNORMAL HIGH (ref <18)

## 2019-11-24 MED ORDER — METOPROLOL TARTRATE 25 MG PO TABS
25.0000 mg | ORAL_TABLET | Freq: Two times a day (BID) | ORAL | Status: DC
  Administered 2019-11-24 – 2019-11-26 (×4): 25 mg via ORAL
  Filled 2019-11-24 (×4): qty 1

## 2019-11-24 MED ORDER — SENNOSIDES-DOCUSATE SODIUM 8.6-50 MG PO TABS: 1.0000 | ORAL_TABLET | Freq: Every evening | ORAL | Status: DC | PRN

## 2019-11-24 MED ORDER — APIXABAN 5 MG PO TABS: 5.0000 mg | ORAL_TABLET | Freq: Two times a day (BID) | ORAL | Status: DC

## 2019-11-24 MED ORDER — HYDRALAZINE HCL 20 MG/ML IJ SOLN
5.0000 mg | Freq: Once | INTRAMUSCULAR | Status: AC
  Administered 2019-11-24: 5 mg via INTRAVENOUS
  Filled 2019-11-24: qty 1

## 2019-11-24 MED ORDER — ACETAMINOPHEN 650 MG RE SUPP: 650.0000 mg | Freq: Four times a day (QID) | RECTAL | Status: DC | PRN

## 2019-11-24 MED ORDER — APIXABAN 5 MG PO TABS
10.0000 mg | ORAL_TABLET | Freq: Two times a day (BID) | ORAL | Status: DC
  Administered 2019-11-24 – 2019-11-26 (×4): 10 mg via ORAL
  Filled 2019-11-24 (×4): qty 2

## 2019-11-24 MED ORDER — PANTOPRAZOLE SODIUM 40 MG PO TBEC: 40.0000 mg | DELAYED_RELEASE_TABLET | Freq: Every day | ORAL | Status: DC

## 2019-11-24 MED ORDER — ACETAMINOPHEN 325 MG PO TABS: 650.0000 mg | ORAL_TABLET | Freq: Four times a day (QID) | ORAL | Status: DC | PRN

## 2019-11-24 MED ORDER — POTASSIUM CHLORIDE IN NACL 20-0.9 MEQ/L-% IV SOLN
INTRAVENOUS | Status: DC
  Filled 2019-11-24 (×4): qty 1000

## 2019-11-24 MED ORDER — PANTOPRAZOLE SODIUM 40 MG IV SOLR
40.0000 mg | Freq: Every morning | INTRAVENOUS | Status: DC
  Administered 2019-11-24 – 2019-11-26 (×3): 40 mg via INTRAVENOUS
  Filled 2019-11-24 (×3): qty 40

## 2019-11-24 NOTE — Consult Note (Signed)
Consultation Note Date: 11/24/2019   Patient Name: Robin Moreno  DOB: [birth year]  MRN: [id number]  Age / Sex: 86 y.o., female  PCP: Jilda Panda, MD Referring Physician: Albertine Patricia, MD  Reason for Consultation: Establishing goals of care  HPI/Patient Profile: 86 y.o. female  with past medical history of CKD stage 3b, hypertention, CVA, possible dementia, chronically bed bound, newly diagnosed atrial fibrillation admitted on 11/23/2019 with failure to thrive and encephalopathy. She is being admitted within 24 hours of previous discharge.  She was recently hospitalized from 8/21-8/26/21 due to respiratory arrest, which required intubation for less than 24 hours. She was diagnosed with atrial fibrillation and PE during that admission as well.   Family face treatment option decisions, advanced directive decisions, and anticipatory care needs.   Clinical Assessment and Goals of Care: I have reviewed medical records including EPIC notes, labs, and imaging. Received report from primary RN - no acute concerns. Patient is refusing food and drink.   Went to visit patient at bedside - no family present. Patient was lying in bed awake, alert, and non-verbal. She was not verbal to participate in conversation or complex decision making. No signs or non-verbal gestures of pain or discomfort noted. No respiratory distress, increased work of breathing, or secretions noted.   Met with patient's son/Robin via phone to discuss diagnosis, prognosis, GOC, EOL wishes, disposition, and options.  I introduced Palliative Medicine as specialized medical care for people living with serious illness. It focuses on providing relief from the symptoms and stress of a serious illness. The goal is to improve quality of life for both the patient and the family.  We discussed a brief life review of the patient. She was married but her  husband unfortunately has passed away. They had three children together and only one son/Robin is still living.   As far as functional and nutritional status, the patient has been living with Robin since last year. Robin states that around last July was the last time the patient was able to walk - she has remained bed bound since that time. Robin states that prior to last Saturday 8/21, the patient was eating well, drinking, and talking. He saw a significant decline in the patient after 8/21 - he states the patient has not had an appetite, poor PO intake, increased confusion, and slurring her words.   We discussed patient's current illness and what it means in the larger context of patient's on-going co-morbidities. I reviewed the patient's current medical condition with Robin. Natural disease trajectory for post-COVID infection, failure to thrive, deconditioned state, along with expectations at EOL were discussed. I attempted to elicit values and goals of care important to the patient and family. The difference between aggressive medical intervention and comfort care was considered in light of the patient's goals of care. I introduced the concept of a comfort path to Robin and encouraged him to think about at what point he would want to stop aggressive medical interventions and focus on helping the patient feel  as best she can for as long as she can outside the hospital, if that were their goal. Hospice philosephy/services were also introduced and discussed.   Robin would like watchful waiting at this time to see if patient shows any improvment off the medications that possibly could have been causing her encephalopathy.    Advance directives, concepts specific to code status, artificial feeding and hydration, and rehospitalization were considered and discussed. Robin stated that the patient had advanced directive paperwork, which included HCPOA documentation allowing him to be the patient's health  care agent. Encouraged patient/family to consider DNR/DNI status understanding evidenced based poor outcomes in similar hospitalized patient, as the cause of arrest is likely associated with advanced chronic illness rather than an easily reversible acute cardio-pulmonary event. Robin was agreeable to DNR/DNI with understanding that the patient would not receive CPR, defibrillation, ACLS medications, or intubation. He stated that the patient already had a DNR form in place and he wanted to respect her wishes. I asked Robin to bring HCPOA and DNR forms with him tomorrow when he visits patient, so we can obtain copies. Knowing the patient is not eating, Robin asked about artifical feeding/feeding tube. We discussed that evidence has shown that PEG tubes in patients with dementia do not increase survival, prevent aspiration, or improve wound healing.  They can promote isolation and use of restraints leading to increased potential for worsening her pressure ulcers. Use of PEG tubes is not medically recommended in this population; rather, careful hand feeding with aspiration precautions, if she is accepting of food, has been shown to provide the best quality of life.  Discussed with Robin the importance of continued conversation with family and the medical providers regarding overall plan of care and treatment options, ensuring decisions are within the context of the patient's values and GOCs.    Questions and concerns were addressed. The family was encouraged to call with questions or concerns.    Primary decision maker: NEXT OF KIN  Robin Moreno - son   SUMMARY OF RECOMMENDATIONS   -Continue current full scope treatment -DNR/DNI initiated -Son would like watchful waiting at this time to see if patient shows any improvment off the medications that possibly could have been causing her encephalopathy  -Son needs time to process information given to him today -Plan to meet with son in person tomorrow  8/29 to continue [place] conversation -Obtain copies of HCPOA and DNR paperwork  -PMT will continue to follow holistically  Code Status/Advance Care Planning:  DNR  Palliative Prophylaxis:   Aspiration, Bowel Regimen, Delirium Protocol, Frequent Pain Assessment, Oral Care and Turn Reposition  Additional Recommendations (Limitations, Scope, Preferences):  Full Scope Treatment  Psycho-social/Spiritual:   Created space and opportunity for patient and family to express thoughts and feelings regarding patient's current medical situation.   Emotional support provided  Prognosis:   Unable to determine  Discharge Planning: To Be Determined      Primary Diagnoses: Present on Admission: . Altered mental status . COVID-19 . Dyslipidemia . Essential hypertension . Pulmonary embolism on right ([place]) . Somnolence . Failure to thrive in adult   I have reviewed the medical record, interviewed the patient and family, and examined the patient. The following aspects are pertinent.  Past Medical History:  Diagnosis Date  . Arthritis   . CVA (cerebral vascular accident) ([place])   . Degenerative disk disease   . Hypercholesterolemia   . Hypertension   . Osteoarthritis of right knee   . Renal disorder   .  Stage 3a chronic kidney disease 09/01/2019   Social History   Socioeconomic History  . Marital status: Widowed    Spouse name: Not on file  . Number of children: Not on file  . Years of education: Not on file  . Highest education level: Not on file  Occupational History  . Not on file  Tobacco Use  . Smoking status: Never Smoker  . Smokeless tobacco: Never Used  Vaping Use  . Vaping Use: Never used  Substance and Sexual Activity  . Alcohol use: No  . Drug use: No  . Sexual activity: Not Currently  Other Topics Concern  . Not on file  Social History Narrative  . Not on file   Social Determinants of Health   Financial Resource Strain:   . Difficulty of Paying Living  Expenses: Not on file  Food Insecurity:   . Worried About Charity fundraiser in the Last Year: Not on file  . Ran Out of Food in the Last Year: Not on file  Transportation Needs:   . Lack of Transportation (Medical): Not on file  . Lack of Transportation (Non-Medical): Not on file  Physical Activity:   . Days of Exercise per Week: Not on file  . Minutes of Exercise per Session: Not on file  Stress:   . Feeling of Stress : Not on file  Social Connections:   . Frequency of Communication with Friends and Family: Not on file  . Frequency of Social Gatherings with Friends and Family: Not on file  . Attends Religious Services: Not on file  . Active Member of Clubs or Organizations: Not on file  . Attends Archivist Meetings: Not on file  . Marital Status: Not on file   No family history on file. Scheduled Meds: . apixaban  10 mg Oral BID   Followed by  . [START ON 11/28/2019] apixaban  5 mg Oral BID  . metoprolol tartrate  25 mg Oral BID  . pantoprazole (PROTONIX) IV  40 mg Intravenous q morning - 10a   Continuous Infusions: . 0.9 % NaCl with KCl 20 mEq / L 50 mL/hr at 11/24/19 0259   PRN Meds:.acetaminophen **OR** acetaminophen, senna-docusate Medications Prior to Admission:  Prior to Admission medications   Medication Sig Start Date End Date Taking? Authorizing Provider  acetaminophen (TYLENOL) 325 MG tablet Take 2 tablets (650 mg total) by mouth every 6 (six) hours as needed. Patient taking differently: Take 650 mg by mouth every 6 (six) hours as needed for mild pain or headache.  01/04/19   Varney Biles, MD  albuterol (VENTOLIN HFA) 108 (90 Base) MCG/ACT inhaler Inhale 1-2 puffs into the lungs every 4 (four) hours as needed for wheezing or shortness of breath.     [provider]  apixaban (ELIQUIS) 5 MG TABS tablet Please take 10 mg oral twice daily for next 5 days, then transition to 5 mg oral twice daily from 11/28/2019 11/22/19   Elgergawy, Silver Huguenin, MD   Ascorbic Acid (VITAMIN C PO) Take 1 tablet by mouth daily.    [provider]  baclofen (LIORESAL) 10 MG tablet Take 10 mg by mouth 2 (two) times daily. 11/16/19   [provider]  Cholecalciferol (VITAMIN D) 2000 UNITS tablet Take 2,000 Units by mouth daily.      [provider]  fluticasone (FLONASE) 50 MCG/ACT nasal spray Place 1 spray into both nostrils daily as needed for allergies or rhinitis.  11/04/19   [provider]  gabapentin (NEURONTIN) 100 MG capsule Take 1 capsule (100 mg total) by mouth 3 (three) times daily. 09/02/19   Shelly Coss, MD  meclizine (ANTIVERT) 12.5 MG tablet Take 1 tablet (12.5 mg total) by mouth 3 (three) times daily as needed for dizziness. 07/23/18   Sherwood Gambler, MD  metoprolol tartrate (LOPRESSOR) 25 MG tablet Take 1 tablet (25 mg total) by mouth 2 (two) times daily. 11/22/19   Elgergawy, Silver Huguenin, MD  Multiple Vitamins-Minerals (ZINC PO) Take 1 tablet by mouth daily.    [provider]  pantoprazole (PROTONIX) 40 MG tablet Take 1 tablet (40 mg total) by mouth daily. 11/23/19   Elgergawy, Silver Huguenin, MD  Polyethyl Glycol-Propyl Glycol (SYSTANE OP) Place 1 drop into both eyes every morning.    [provider]  rosuvastatin (CRESTOR) 10 MG tablet Take 10 mg by mouth daily. 09/19/19   [provider]   No Known Allergies Review of Systems  Unable to perform ROS: Mental status change    Physical Exam Vitals and nursing note reviewed.  Constitutional:      General: She is not in acute distress.    Appearance: She is ill-appearing.  Pulmonary:     Effort: No respiratory distress.  Skin:    General: Skin is warm and dry.  Neurological:     Mental Status: She is alert.     Motor: Weakness present.  Psychiatric:        Cognition and Memory: Cognition is impaired.     Comments: Non-verbal, does not follow commands      Vital Signs: BP (!) 145/108   Pulse 82   Temp 98.1 F (36.7 C) (Rectal)    Resp (!) 22   Ht 5' 6"  (1.676 m)   Wt 54 kg   SpO2 99%   BMI 19.21 kg/m  Pain Scale: Faces       SpO2: SpO2: 99 % O2 Device:SpO2: 99 % O2 Flow Rate: .   IO: Intake/output summary: No intake or output data in the 24 hours ending 11/24/19 1640  LBM:   Baseline Weight: Weight: 54 kg Most recent weight: Weight: 54 kg     Palliative Assessment/Data: PPS 10%     Time In: 1545 Time Out: 1655 Time Total: 70 minutes Greater than 50%  of this time was spent counseling and coordinating care related to the above assessment and plan.  Signed by: Lin Landsman, NP   Please contact Palliative Medicine Team phone at [phone] for questions and concerns.  For individual provider: See Shea Evans

## 2019-11-24 NOTE — Progress Notes (Signed)
   11/24/19 1708  Assess: MEWS Score  Temp 98.4 F (36.9 C)  BP (!) 174/94  Pulse Rate 91  Resp 20  Level of Consciousness Responds to Pain  SpO2 99 %  O2 Device Room Air  Patient Activity (if Appropriate) In bed  Assess: MEWS Score  MEWS Temp 0  MEWS Systolic 0  MEWS Pulse 0  MEWS RR 0  MEWS LOC 2  MEWS Score 2  MEWS Score Color Yellow  Assess: if the MEWS score is Yellow or Red  Were vital signs taken at a resting state? Yes  Focused Assessment No change from prior assessment  Early Detection of Sepsis Score *See Row Information* Low  MEWS guidelines implemented *See Row Information* No, altered LOC is baseline  Treat  MEWS Interventions Administered scheduled meds/treatments  Pain Scale Faces  Pain Score 0  Faces Pain Scale 0  Facial Expression 0  Body Language 0  Notify: Charge Nurse/RN  Name of Charge Nurse/RN Notified Park Meo, RN  Date Charge Nurse/RN Notified 11/24/19  Time Charge Nurse/RN Notified 1716  Document  Patient Outcome Stabilized after interventions

## 2019-11-24 NOTE — TOC Initial Note (Signed)
Transition of Care [facility]) - Initial/Assessment Note    Patient Details  Name: Robin Moreno MRN: [id number] Date of Birth: 12-Feb-1926  [facility] CM/SW Contact:    Lockie Pares, RN Phone Number: 11/24/2019, 2:54 PM  Clinical Narrative:                 Just discharged yesterday came back with unresponsive this Am lives with sone. Received hospital bed and hoyer lift.  Followed by home health. Positive COVID.  Is active with [facility].   Expected Discharge Plan: Home w Home Health Services Barriers to Discharge: Continued Medical Work up   Patient Goals and CMS Choice     Choice offered to / list presented to : Adult Children  Expected Discharge Plan and Services Expected Discharge Plan: Home w Home Health Services     Post Acute Care Choice: Durable Medical Equipment, Home Health Living arrangements for the past 2 months: Single Family Home                                      Prior Living Arrangements/Services Living arrangements for the past 2 months: Single Family Home Lives with:: Adult Children Patient language and need for interpreter reviewed:: Yes        Need for Family Participation in Patient Care: Yes (Comment) Care giver support system in place?: Yes (comment) Current home services: Home PT Criminal Activity/Legal Involvement Pertinent to Current Situation/Hospitalization: No - Comment as needed  Activities of Daily Living      Permission Sought/Granted      Share Information with NAME: Daryl     Permission granted to share info w Relationship: Son     Emotional Assessment   Attitude/Demeanor/Rapport: Unable to Assess Affect (typically observed): Unable to Assess Orientation: : Fluctuating Orientation (Suspected and/or reported Sundowners) Alcohol / Substance Use: Not Applicable Psych Involvement: No (comment)  Admission diagnosis:  Somnolence [R40.0] Failure to thrive in adult [R62.7] Patient Active Problem List    Diagnosis Date Noted  . Failure to thrive in adult 11/24/2019  . Somnolence 11/23/2019  . Pulmonary embolism on right ([facility]) 11/20/2019  . Pressure injury of skin 11/18/2019  . Respiratory distress 11/17/2019  . Altered mental status   . Respiratory arrest ([facility])   . Facial twitching 09/01/2019  . Stage 3a chronic kidney disease 09/01/2019  . DNR (do not resuscitate) 09/01/2019  . COVID-19 07/30/2019  . Essential hypertension 07/30/2019  . Dyslipidemia 07/30/2019  . Pre-syncope 07/30/2019  . Viral gastroenteritis due to [place] agent 09/19/2018  . Acute kidney injury superimposed on CKD (HCC) 09/18/2018  . Diarrhea 09/18/2018  . Urinary tract infection 09/18/2018  . Generalized weakness 09/18/2018  . Arthritis 09/18/2018   PCP:  Ralene Ok, MD Pharmacy:   [facility] [address], [place] - [address] [place] [address] Phone: [phone] Fax: [phone]  [id number] - [place], [place] - [address] AT [facility] [address] [place] [address] Phone: [phone] Fax: [phone]  [id number] [place], [place] - [address] AT [facility] [address] [place] [address] Phone: [phone] Fax: [phone]  [facility] [place], [place] - [address] [place] [address] Phone: [phone] Fax: [phone]  Social Determinants of Health (SDOH) Interventions    Readmission Risk Interventions No flowsheet data found.

## 2019-11-24 NOTE — Progress Notes (Signed)
Pt admitted to 6N19 from ED via stretcher. Pt presents with a stage 2 pressure injury sacrally located 2.0 x .25 cm.  Sacral foam placed over injury.  Turned pt to side and floated heels. Pt is non verbal but does some moaning. Pt cleansed and placed warm blanket over pt.

## 2019-11-24 NOTE — ED Notes (Addendum)
[name] (Son) phone number: [phone]. Wishes to be notified if pt resumes verbal communication with staff or resumes oral inake. This RN held phone to pt's ear for son to talk with pt. No notable response from pt at that time.

## 2019-11-24 NOTE — Progress Notes (Signed)
Ordered bilateral Prevalon boots for feet to eliminate any pressure on the heels, will educate the son about their use for home use.  Turning pt side to side to alleviate pressure on the sacral area. Pt appears comfortable.

## 2019-11-24 NOTE — Progress Notes (Signed)
PROGRESS NOTE                                                                                                                                                                                                             Patient Demographics:    Robin Moreno, is a 86 y.o. female, DOB - November 02, 1925, PHK:[id number]  Admit date - 11/23/2019   Admitting Physician Albertine Patricia, MD  Outpatient Primary MD for the patient is Jilda Panda, MD  LOS - 0   Chief Complaint  Patient presents with  . Altered Mental Status       Brief Narrative    HPI : This is a 86 year old female who was just discharged she yesterday. She was found in a unresponsive state at home with agonal breathing. She was intubation for airway protection, extubated within 24 hours by PCCM. She was also found to have COVID infection.  Chest x-ray was negative.  She was clear and oriented at the time of discharge but thought to have some underlying mild dementia.  Per son patient was good when discharged but he was unable to wake her up this morning.  He let her sleep but when he was still unable to wake her at [facility] he decided to take her to the ER.  Per ER physician he was told by the son that despite being unable to fully awaken his mother he did give her home meds along with some cough medications. On presentation to the ER she was mildly arousable but still sleeping.  During my interview patient is nontoxic.  She arouses with little stimulation.  She does go immediately back to sleep.  She is not talkative.   Subjective:    Robin Moreno today is nonverbal, unable to provide any complaints, but as discussed with staff, no significant events, but she been refusing all food and medications .    Assessment  & Plan :    Active Problems:   COVID-19   Essential hypertension   Dyslipidemia   Altered mental status   Pulmonary embolism on right (HCC)   Somnolence   Failure to thrive in  adult  Failure to thrive/encephalopathy. -Patient is more awake this morning, opens eyes and tracks, but does not follow commands, or answer any questions. -Recent hospitalization with work-up including MRI with no acute findings, CT  head with no acute finding this time as well. -Today she remains with no oral intake, refusing all food, will keep on IV fluids. -PT/OT consulted -On baclofen, gabapentin, it seems most of her symptoms started after taking baclofen yesterday, so we will hold these meds. -Discussed at length with son today, patient is frail, demented, been refusing to eat and drink despite being awake at sometimes, have discussed with him about the concept about palliative and hospice, he and he would like to hear from them before making any decisions.   Recent history of COVID-19 infection -Does not require any COVID-19 isolation currently, she has been cleared through recent admission, she is on no isolation due to Covid.  Have discussed with ID Dr. Baxter Flattery. -Resolved active COVID-19 infection from several months ago.  During recent hospitalization she wasincidental Covid positive 11/16/2019, but cycle count over 42 no isolation needed.   Pressure ulcers, sacrum, bilateral heels -Wound care consult.  Wound care to stage -Chronically bedbound patient  Pulmonary embolism on right [facility]) -Diagnosed last admission.  D-dimer trending down -Stable, resume home meds  Atrial fibrillation -New diagnosis last admission.  Home meds resumed including Eliquis  Dyslipidemia -Stable, home meds   Goals of care -She is extremely frail, deconditioned, using oral intake, she is on IV fluids, discussed with son concept about palliative/hospice, he is interested to hear from them, but would like to make decisions after discussing with palliative.    COVID-19 Labs  Recent Labs    11/22/19 0214  DDIMER 2.93*  CRP 7.3*    Lab Results  Component Value Date   SARSCOV2NAA  POSITIVE (A) 11/16/2019   SARSCOV2NAA POSITIVE (A) 07/30/2019   SARSCOV2NAA NEGATIVE 09/30/2018   [place] NEGATIVE 09/18/2018     Code Status : Full  Family Communication  : D/W son  Disposition Plan  :  Status is: Inpatient  Remains inpatient appropriate because:IV treatments appropriate due to intensity of illness or inability to take PO   Dispo: The patient is from: Home              Anticipated d/c is to: Home              Anticipated d/c date is: 2 days              Patient currently is not medically stable to d/c.  Is on IV fluids, very poor oral intake       Consults  :  palliative  Procedures  : None  DVT Prophylaxis  :  On Eliquis  Lab Results  Component Value Date   PLT 322 11/24/2019    Antibiotics  :    Anti-infectives (From admission, onward)   None        Objective:   Vitals:   11/24/19 1030 11/24/19 1100 11/24/19 1200 11/24/19 1315  BP: (!) 153/77 (!) 158/86 (!) 162/80 (!) 161/84  Pulse: 90 79 85 81  Resp: (!) 21 (!) 22    Temp:      TempSrc:      SpO2: 100% 100% 100% 100%  Weight:      Height:        Wt Readings from Last 3 Encounters:  11/24/19 54 kg  11/22/19 84.1 kg  09/01/19 70 kg    No intake or output data in the 24 hours ending 11/24/19 1422   Physical Exam  He is awake, extremely frail, deconditioned, does not follow commands or answer questions . Symmetrical Chest wall movement, Good  air movement bilaterally, CTAB RRR,No Gallops,Rubs or new Murmurs, No Parasternal Heave +ve B.Sounds, Abd Soft, No tenderness,  No rebound - guarding or rigidity. No Cyanosis, Clubbing or edema, No new Rash or bruise     Data Review:    CBC Recent Labs  Lab 11/17/19 1640 11/17/19 1822 11/18/19 0347 11/21/19 0726 11/22/19 0214 11/23/19 1830 11/24/19 0841  WBC 9.0   < > 10.1 7.7 6.3 5.8 7.6  HGB 9.7*   < > 10.7* 10.2* 10.0* 10.3* 10.7*  HCT 31.0*   < > 33.9* 31.5* 30.8* 33.9* 34.6*  PLT 239   < > 244 262 234 304 322   MCV 96.3   < > 96.3 90.0 93.1 95.5 93.0  MCH 30.1   < > 30.4 29.1 30.2 29.0 28.8  MCHC 31.3   < > 31.6 32.4 32.5 30.4 30.9  RDW 13.2   < > 13.2 13.3 13.5 13.9 13.8  LYMPHSABS 2.3  --   --  1.6 1.5 1.8  --   MONOABS 0.7  --   --  0.7 0.6 0.5  --   EOSABS 0.2  --   --  0.3 0.3 0.2  --   BASOSABS 0.0  --   --  0.1 0.1 0.0  --    < > = values in this interval not displayed.    Chemistries  Recent Labs  Lab 11/17/19 1640 11/17/19 1822 11/18/19 0347 11/21/19 0726 11/22/19 0214 11/23/19 1830 11/24/19 0916  NA 138   < > 142 141 139 141 139  K 4.0   < > 4.4 3.1* 4.1 3.6 3.5  CL 110   < > 110 113* 111 111 107  CO2 18*   < > 19* 16* 18* 20* 19*  GLUCOSE 213*   < > 101* 126* 119* 105* 105*  BUN 16   < > _0 7* 7*  CREATININE 0.95   < > 0.80 0.70 0.70 0.84 0.78  CALCIUM 8.1*   < > 8.5* 8.9 8.9 9.3 9.3  MG  --   --  1.9 1.6* 2.0  --   --   AST 32  --   --  39 36 37  --   ALT 22  --   --  _1 --   ALKPHOS 68  --   --  72 70 72  --   BILITOT 0.7  --   --  0.8 0.3 0.8  --    < > = values in this interval not displayed.   ------------------------------------------------------------------------------------------------------------------ No results for input(s): CHOL, HDL, LDLCALC, TRIG, CHOLHDL, LDLDIRECT in the last 72 hours.  Lab Results  Component Value Date   HGBA1C 5.6 11/18/2019   ------------------------------------------------------------------------------------------------------------------ No results for input(s): TSH, T4TOTAL, T3FREE, THYROIDAB in the last 72 hours.  Invalid input(s): FREET3 ------------------------------------------------------------------------------------------------------------------ No results for input(s): VITAMINB12, FOLATE, FERRITIN, TIBC, IRON, RETICCTPCT in the last 72 hours.  Coagulation profile No results for input(s): INR, PROTIME in the last 168 hours.  Recent Labs    11/22/19 0214  DDIMER 2.93*    Cardiac Enzymes No results  for input(s): CKMB, TROPONINI, MYOGLOBIN in the last 168 hours.  Invalid input(s): CK ------------------------------------------------------------------------------------------------------------------    Component Value Date/Time   BNP 175.9 (H) 11/22/2019 0214    Inpatient Medications  Scheduled Meds: . apixaban  10 mg Oral BID   Followed by  . [START ON 11/28/2019] apixaban  5 mg Oral BID  . metoprolol tartrate  25 mg Oral BID  . pantoprazole (PROTONIX) IV  40 mg Intravenous q morning - 10a   Continuous Infusions: . 0.9 % NaCl with KCl 20 mEq / L 50 mL/hr at 11/24/19 0259   PRN Meds:.acetaminophen **OR** acetaminophen, senna-docusate  Micro Results Recent Results (from the past 240 hour(s))  SARS Coronavirus 2 by RT PCR (hospital order, performed in [facility] hospital lab) Nasopharyngeal Nasopharyngeal Swab     Status: Abnormal   Collection Time: 11/16/19  5:02 PM   Specimen: Nasopharyngeal Swab  Result Value Ref Range Status   SARS Coronavirus 2 POSITIVE (A) NEGATIVE Final    Comment: RESULT CALLED TO, READ BACK BY AND VERIFIED WITH: L MEEKS RN 11/16/19 AT 1907 SK (NOTE) SARS-CoV-2 target nucleic acids are DETECTED  SARS-CoV-2 RNA is generally detectable in upper respiratory specimens  during the acute phase of infection.  Positive results are indicative  of the presence of the identified virus, but do not rule out bacterial infection or co-infection with other pathogens not detected by the test.  Clinical correlation with patient history and  other diagnostic information is necessary to determine patient infection status.  The expected result is negative.  Fact Sheet for Patients:   StrictlyIdeas.no   Fact Sheet for Healthcare Providers:   BankingDealers.co.za    This test is not yet approved or cleared by the [place] FDA and  has been authorized for detection and/or diagnosis of SARS-CoV-2 by FDA under an  Emergency Use Authorization (EUA).  This EUA will remain in effect (meaning this test ca n be used) for the duration of  the COVID-19 declaration under Section 564(b)(1) of the Act, 21 U.S.C. section 360-bbb-3(b)(1), unless the authorization is terminated or revoked sooner.  Performed at [facility], [place] [address], [place], [place] [address]   MRSA PCR Screening     Status: None   Collection Time: 11/18/19  1:01 AM   Specimen: Urine, Catheterized; Nasopharyngeal  Result Value Ref Range Status   MRSA by PCR NEGATIVE NEGATIVE Final    Comment:        The GeneXpert MRSA Assay (FDA approved for NASAL specimens only), is one component of a comprehensive MRSA colonization surveillance program. It is not intended to diagnose MRSA infection nor to guide or monitor treatment for MRSA infections. Performed at [facility], [place] [address], [place], [place] [address]   Culture, blood (routine x 2)     Status: None   Collection Time: 11/18/19 10:29 AM   Specimen: BLOOD  Result Value Ref Range Status   Specimen Description BLOOD RIGHT ANTECUBITAL  Final   Special Requests   Final    BOTTLES DRAWN AEROBIC AND ANAEROBIC Blood Culture results may not be optimal due to an inadequate volume of blood received in culture bottles   Culture   Final    NO GROWTH 5 DAYS Performed at [facility], [place] [address], [place], [place] [address]    Report Status 11/23/2019 FINAL  Final  Culture, blood (routine x 2)     Status: None   Collection Time: 11/18/19 10:29 AM   Specimen: BLOOD RIGHT HAND  Result Value Ref Range Status   Specimen Description BLOOD RIGHT HAND  Final   Special Requests AEROBIC BOTTLE ONLY Blood Culture adequate volume  Final   Culture   Final    NO GROWTH 5 DAYS Performed at [facility], [place] [address] [place], [place] [address]  Report Status 11/23/2019 FINAL  Final    Radiology Reports CT Head Wo Contrast  Result Date: 11/23/2019 CLINICAL  DATA:  Altered mental status. Progressive altered mental status for 2 days. Recent hospital discharge after fall. EXAM: CT HEAD WITHOUT CONTRAST TECHNIQUE: Contiguous axial images were obtained from the base of the skull through the vertex without intravenous contrast. COMPARISON:  Head CT 11/17/2019, brain MRI 10/29/2019 FINDINGS: Brain: Mild motion artifact limitations on the current exam. Stable degree of atrophy and chronic small vessel ischemia. Again seen remote left frontoparietal infarct. No intracranial hemorrhage, mass effect, or midline shift. No hydrocephalus. The basilar cisterns are patent. No evidence of territorial infarct or acute ischemia. No extra-axial or intracranial fluid collection. Vascular: Atherosclerosis of skullbase vasculature without hyperdense vessel or abnormal calcification. Skull: No fracture or focal lesion. Sinuses/Orbits: Paranasal sinuses and mastoid air cells are clear. The visualized orbits are unremarkable. Bilateral cataract resection. Other: None. IMPRESSION: 1. No acute intracranial abnormality, allowing for motion artifact on the current exam. 2. Stable atrophy, chronic small vessel ischemia, and remote left frontoparietal infarct. Electronically Signed   By: Keith Rake M.D.   On: 11/23/2019 19:03   CT HEAD WO CONTRAST  Result Date: 11/17/2019 CLINICAL DATA:  Altered level of consciousness, trauma, unresponsive, found down EXAM: CT HEAD WITHOUT CONTRAST CT CERVICAL SPINE WITHOUT CONTRAST TECHNIQUE: Multidetector CT imaging of the head and cervical spine was performed following the standard protocol without intravenous contrast. Multiplanar CT image reconstructions of the cervical spine were also generated. COMPARISON:  08/31/2019 FINDINGS: CT HEAD FINDINGS Brain: Hypodensity left parietal cortex consistent with chronic infarct, stable. No signs of acute infarct or hemorrhage. Lateral ventricles and midline structures are unremarkable. No acute extra-axial fluid  collections. No mass effect. Vascular: No hyperdense vessel or unexpected calcification. Skull: Normal. Negative for fracture or focal lesion. Sinuses/Orbits: No acute finding. Other: None. CT CERVICAL SPINE FINDINGS Alignment: Alignment is anatomic. Skull base and vertebrae: No acute displaced fracture. Soft tissues and spinal canal: Patient is intubated, endotracheal tube at level of thoracic inlet. Enteric catheter is identified, distal margin excluded by slice selection. Prevertebral soft tissues are unremarkable. No visible canal hematoma. Disc levels: Stable diffuse facet hypertrophy. Mild spondylosis at C3-4 and C6-7. There is mild diffuse neural foraminal encroachment related to facet hypertrophic change. Findings are stable. Upper chest: Lung apices are clear. Other: Reconstructed images demonstrate no additional findings. IMPRESSION: 1. No acute intracranial process. 2. Stable chronic left parietal infarct. 3. No acute cervical spine fracture. Electronically Signed   By: Randa Ngo M.D.   On: 11/17/2019 18:36   CT ANGIO CHEST PE W OR WO CONTRAST  Result Date: 11/20/2019 CLINICAL DATA:  86 year old female with shortness of breath. Positive COVID-19. Concern for pulmonary embolism. EXAM: CT ANGIOGRAPHY CHEST WITH CONTRAST TECHNIQUE: Multidetector CT imaging of the chest was performed using the standard protocol during bolus administration of intravenous contrast. Multiplanar CT image reconstructions and MIPs were obtained to evaluate the vascular anatomy. CONTRAST:  85m OMNIPAQUE IOHEXOL 350 MG/ML SOLN COMPARISON:  Chest CT dated 11/17/2019. FINDINGS: Evaluation is very limited due to respiratory motion artifact and streak artifact caused by patient's arms. Cardiovascular: There is no cardiomegaly or pericardial effusion. Three-vessel coronary vascular calcification. There is moderate atherosclerotic calcification of the thoracic aorta. Evaluation of the pulmonary arteries is limited due to  respiratory motion artifact. There is a band like density in the right upper lobe pulmonary artery branch (94/6) which may be artifactual and represent a branch point or  a nonocclusive thrombus. Additional apparent filling defects in the subsegmental branches of the right lower lobe (1710/6) may represent pulmonary emboli. Evaluation is very limited due to severe motion artifact. V/Q scan may provide better evaluation if clinically indicated. No large central pulmonary artery embolus identified. Mediastinum/Nodes: There is no hilar or mediastinal adenopathy. The esophagus is grossly unremarkable. No mediastinal fluid collection. Lungs/Pleura: Small bilateral pleural effusions. There is an area of consolidative change involving the right lower lobe similar or slightly progressed since the prior CT which may represent atelectasis or pneumonia. Linear left lung base atelectasis noted. No pneumothorax. The central airways are patent. Upper Abdomen: No acute abnormality. Musculoskeletal: Osteopenia with degenerative changes of the spine. No acute osseous pathology. Old anterior rib fractures. Review of the MIP images confirms the above findings. IMPRESSION: 1. Very limited study due to respiratory motion artifact and streak artifact caused by patient's arms. 2. Probable small right upper lobe and right lower lobe subsegmental pulmonary emboli. No large central pulmonary artery embolus identified. V/Q scan may provide better evaluation if clinically indicated. 3. Small bilateral pleural effusions with an area of consolidative change involving the right lower lobe similar or slightly progressed since the prior CT which may represent atelectasis or pneumonia. 4. Aortic Atherosclerosis (ICD10-I70.0). These results were called by telephone at the time of interpretation on 11/20/2019 at 7:51 pm to Dr. Myna Hidalgo, who verbally acknowledged these results. Electronically Signed   By: Anner Crete M.D.   On: 11/20/2019 20:08   CT  Angio Chest PE W and/or Wo Contrast  Result Date: 11/17/2019 CLINICAL DATA:  Unresponsive, axonal breathing. EXAM: CT ANGIOGRAPHY CHEST WITH CONTRAST TECHNIQUE: Multidetector CT imaging of the chest was performed using the standard protocol during bolus administration of intravenous contrast. Multiplanar CT image reconstructions and MIPs were obtained to evaluate the vascular anatomy. CONTRAST:  76m OMNIPAQUE IOHEXOL 350 MG/ML SOLN COMPARISON:  Chest x-ray same date., remote chest CT from 2005 FINDINGS: Cardiovascular: Calcified atheromatous plaque in the thoracic aorta. Calcifications of the aortic valve. Mitral annular calcifications. No aneurysmal dilation of the thoracic aorta. Limited assessment of the aorta due to bolus timing. Contour is smooth. No pericardial effusion. Heart size is normal. Central pulmonary vascular caliber is normal. Study quality is good. No pulmonary embolism. Mediastinum/Nodes: Endotracheal tube in place terminating just at the thoracic inlet. Balloon inflation just below the larynx. This was previously communicated. Gastric tube passes through the esophagus into the stomach. No adenopathy at the thoracic inlet. No mediastinal adenopathy. No hilar adenopathy. Lungs/Pleura: Basilar airspace disease. No filling defects or fluid in bronchi. No pneumothorax. No lobar consolidation. No pleural effusion. Upper Abdomen: Gallbladder is distended, moderately with gallstones in the dependent portion. No pericholecystic stranding. No peripancreatic stranding. Adrenal glands are normal. No acute process in the incompletely imaged upper abdomen. Musculoskeletal: No acute bone process. No chest wall lesion. No destructive bone finding. Glenohumeral and spinal degenerative changes. Review of the MIP images confirms the above findings. IMPRESSION: 1. Negative for pulmonary embolism. 2. Basilar airspace disease, likely atelectasis. 3. Endotracheal tube in place terminating just at the thoracic inlet.  Balloon inflation just below the larynx. This was previously communicated as outlined on previous chest radiograph. 4. Cholelithiasis without evidence of acute cholecystitis. 5. Aortic atherosclerosis. Aortic Atherosclerosis (ICD10-I70.0). Electronically Signed   By: GZetta BillsM.D.   On: 11/17/2019 18:46   CT Cervical Spine Wo Contrast  Result Date: 11/17/2019 CLINICAL DATA:  Altered level of consciousness, trauma, unresponsive, found down EXAM: CT HEAD  WITHOUT CONTRAST CT CERVICAL SPINE WITHOUT CONTRAST TECHNIQUE: Multidetector CT imaging of the head and cervical spine was performed following the standard protocol without intravenous contrast. Multiplanar CT image reconstructions of the cervical spine were also generated. COMPARISON:  08/31/2019 FINDINGS: CT HEAD FINDINGS Brain: Hypodensity left parietal cortex consistent with chronic infarct, stable. No signs of acute infarct or hemorrhage. Lateral ventricles and midline structures are unremarkable. No acute extra-axial fluid collections. No mass effect. Vascular: No hyperdense vessel or unexpected calcification. Skull: Normal. Negative for fracture or focal lesion. Sinuses/Orbits: No acute finding. Other: None. CT CERVICAL SPINE FINDINGS Alignment: Alignment is anatomic. Skull base and vertebrae: No acute displaced fracture. Soft tissues and spinal canal: Patient is intubated, endotracheal tube at level of thoracic inlet. Enteric catheter is identified, distal margin excluded by slice selection. Prevertebral soft tissues are unremarkable. No visible canal hematoma. Disc levels: Stable diffuse facet hypertrophy. Mild spondylosis at C3-4 and C6-7. There is mild diffuse neural foraminal encroachment related to facet hypertrophic change. Findings are stable. Upper chest: Lung apices are clear. Other: Reconstructed images demonstrate no additional findings. IMPRESSION: 1. No acute intracranial process. 2. Stable chronic left parietal infarct. 3. No acute  cervical spine fracture. Electronically Signed   By: Randa Ngo M.D.   On: 11/17/2019 18:36   MR BRAIN WO CONTRAST  Result Date: 11/18/2019 CLINICAL DATA:  Altered mental status with unknown cause. Chronic kidney disease EXAM: MRI HEAD WITHOUT CONTRAST TECHNIQUE: Multiplanar, multiecho pulse sequences of the brain and surrounding structures were obtained without intravenous contrast. COMPARISON:  Head CT from yesterday FINDINGS: Brain: No acute infarction, hemorrhage, hydrocephalus, extra-axial collection or mass lesion. Remote left frontal parietal infarct with hemosiderin deposition, small. Chronic small vessel ischemia in the cerebral white matter and pons, moderately extensive. Prominent cortical spinal radiations on diffusion imaging attributed to anisotropy rather than pathology. Age normal brain volume. Vascular: Preserved flow voids Skull and upper cervical spine: No focal marrow lesion. Degenerative facet spurring with C3-4 facet ankylosis. Sinuses/Orbits: Bilateral cataract resection IMPRESSION: 1. No emergent or reversible finding. 2. Senescent changes and small remote left frontal parietal cortex infarct. Electronically Signed   By: Monte Fantasia M.D.   On: 11/18/2019 07:26   DG Chest Port 1 View  Result Date: 11/23/2019 CLINICAL DATA:  Altered mental status. EXAM: PORTABLE CHEST 1 VIEW COMPARISON:  November 17, 2019 FINDINGS: The endotracheal tube and nasogastric tube seen on the prior study have been removed. Multiple overlying cardiac lead wires are seen. Mild, diffuse chronic appearing increased interstitial lung markings are noted. There is no evidence of acute infiltrate, pleural effusion or pneumothorax. The heart size and mediastinal contours are within normal limits. Degenerative changes seen throughout the thoracic spine. IMPRESSION: Chronic appearing increased interstitial lung markings without evidence of acute or active cardiopulmonary disease. Electronically Signed   By:  Virgina Norfolk M.D.   On: 11/23/2019 17:20   DG Chest Port 1 View  Result Date: 11/17/2019 CLINICAL DATA:  Tube adjustment. COVID positive patient, found unresponsive EXAM: PORTABLE CHEST 1 VIEW COMPARISON:  Chest x-ray November 17, 2019 FINDINGS: Interval advancement of the endotracheal tube now approximately 5 cm above the carina. Gastric tube in place with tip in the stomach in the upper abdomen. Cardiomediastinal contours are stable. Lungs are clear. Visualized skeletal structures on limited assessment are unremarkable. IMPRESSION: No acute cardiopulmonary disease with interval advancement of endotracheal tube now approximately 5 cm above the carina. Electronically Signed   By: Zetta Bills M.D.   On: 11/17/2019 19:30  DG Chest Port 1 View  Result Date: 11/17/2019 CLINICAL DATA:  Respiratory failure EXAM: PORTABLE CHEST 1 VIEW COMPARISON:  11/16/2019 FINDINGS: Endotracheal tube approximately 6.8 cm above the carina. Tip is above the level of the clavicular heads. Gastric tube in the stomach. Cardiomediastinal contours are unchanged, likely normal accounting for low depth of expansion and projection. Lungs are clear. No acute skeletal process on limited assessment. IMPRESSION: 1. No acute cardiopulmonary process on limited assessment. 2. Endotracheal tube, tip above the clavicular heads approximately 7 cm from the carina. Consider slight advancement approximately 2 cm with repeat radiography to document placement. 3. These results were called by telephone at the time of interpretation on 11/17/2019 at 6:22 pm to provider [facility] , who verbally acknowledged these results. Electronically Signed   By: Zetta Bills M.D.   On: 11/17/2019 18:22   DG Chest Port 1 View  Result Date: 11/16/2019 CLINICAL DATA:  Bilateral lower extremity spasms for 1 week. EXAM: PORTABLE CHEST 1 VIEW COMPARISON:  Single-view of the chest 09/01/2019. FINDINGS: Lungs are clear. Heart size is normal. No pneumothorax  or pleural effusion. Aortic atherosclerosis. No acute or focal bony abnormality. IMPRESSION: No acute disease. Aortic Atherosclerosis (ICD10-I70.0). Electronically Signed   By: Inge Rise M.D.   On: 11/16/2019 16:32   VAS [place] LOWER EXTREMITY VENOUS (DVT)  Result Date: 11/22/2019  Lower Venous DVTStudy Indications: Rapidly rising ddimer.  Comparison Study: no prior Performing Technologist: Abram Sander RVS  Examination Guidelines: A complete evaluation includes B-mode imaging, spectral Doppler, color Doppler, and power Doppler as needed of all accessible portions of each vessel. Bilateral testing is considered an integral part of a complete examination. Limited examinations for reoccurring indications may be performed as noted. The reflux portion of the exam is performed with the patient in reverse Trendelenburg.  +---------+---------------+---------+-----------+----------+--------------+ RIGHT    CompressibilityPhasicitySpontaneityPropertiesThrombus Aging +---------+---------------+---------+-----------+----------+--------------+ CFV      Full           Yes      Yes                                 +---------+---------------+---------+-----------+----------+--------------+ SFJ      Full                                                        +---------+---------------+---------+-----------+----------+--------------+ FV Prox  Full                                                        +---------+---------------+---------+-----------+----------+--------------+ FV Mid   Full                                                        +---------+---------------+---------+-----------+----------+--------------+ FV DistalFull                                                        +---------+---------------+---------+-----------+----------+--------------+  PFV      Full                                                         +---------+---------------+---------+-----------+----------+--------------+ POP      Full           Yes      Yes                                 +---------+---------------+---------+-----------+----------+--------------+ PTV      Full                                                        +---------+---------------+---------+-----------+----------+--------------+ PERO                                                  Not visualized +---------+---------------+---------+-----------+----------+--------------+   +---------+---------------+---------+-----------+----------+--------------+ LEFT     CompressibilityPhasicitySpontaneityPropertiesThrombus Aging +---------+---------------+---------+-----------+----------+--------------+ CFV      Full           Yes      Yes                                 +---------+---------------+---------+-----------+----------+--------------+ SFJ      Full                                                        +---------+---------------+---------+-----------+----------+--------------+ FV Prox  Full                                                        +---------+---------------+---------+-----------+----------+--------------+ FV Mid   Full                                                        +---------+---------------+---------+-----------+----------+--------------+ FV DistalFull                                                        +---------+---------------+---------+-----------+----------+--------------+ PFV      Full                                                        +---------+---------------+---------+-----------+----------+--------------+  POP      Full           Yes      Yes                                 +---------+---------------+---------+-----------+----------+--------------+ PTV      Full                                                         +---------+---------------+---------+-----------+----------+--------------+ PERO     Full                                                        +---------+---------------+---------+-----------+----------+--------------+     Summary: BILATERAL: - No evidence of deep vein thrombosis seen in the lower extremities, bilaterally. - No evidence of superficial venous thrombosis in the lower extremities, bilaterally. -   *See table(s) above for measurements and observations. Electronically signed by Monica Martinez MD on 11/22/2019 at 3:12:12 PM.    Final    ECHOCARDIOGRAM LIMITED  Result Date: 11/21/2019    ECHOCARDIOGRAM LIMITED REPORT   Patient Name:   MODINE OPPENHEIMER Date of Exam: 11/21/2019 Medical Rec #:  [id number]     Height:       66.0 in Accession #:    [id number]    Weight:       187.4 lb Date of Birth:  03-25-1926      BSA:          1.945 m Patient Age:    50 years      BP:           144/69 mmHg Patient Gender: F             HR:           77 bpm. Exam Location:  Inpatient Procedure: Limited Echo, Limited Color Doppler and Cardiac Doppler Indications:    CHF  History:        Patient has prior history of Echocardiogram examinations, most                 recent 10/10/2012. Covid Positive; Risk Factors:Hypertension.  Sonographer:    Mikki Santee RDCS (AE) Referring Phys: [id number] [facility] [place]  Sonographer Comments: Image acquisition challenging due to uncooperative patient. IMPRESSIONS  1. There is moderate asymmetric hypertrophy of the basal septum up to 1.5 cm. There is a gradient across the LVOT up to 27 mmHG. No clear SAM of the MV was observed, but this study was low quality due to patient not wanting to complete the study. No provocable maneuvers performed due to lack of cooperation. There was central MR, and the MV was noted to have severe MAC. 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 Would consider further characterization with Cardiac MR.  Left ventricular  ejection fraction, by estimation, is 55 to 60%. The left ventricle has normal function. The left ventricle has no regional wall motion abnormalities. There is moderate asymmetric left ventricular hypertrophy of the basal-septal segment.  Left ventricular diastolic function could not be evaluated.  2. Right ventricular systolic function  is normal. The right ventricular size is normal.  3. Left atrial size was mildly dilated.  4. The mitral valve is degenerative. Mild mitral valve regurgitation. Mild mitral stenosis. The mean mitral valve gradient is 3.0 mmHg with average heart rate of 77 bpm.  5. The aortic valve was not well visualized. Aortic valve regurgitation is not visualized. Mild aortic valve stenosis. Aortic valve area, by VTI measures 1.76 cm. Aortic valve mean gradient measures 11.5 mmHg. Aortic valve Vmax measures 2.29 m/s. FINDINGS  Left Ventricle: There is moderate asymmetric hypertrophy of the basal septum up to 1.5 cm. There is a gradient across the LVOT up to 27 mmHG. No clear SAM of the MV was observed, but this study was low quality due to patient not wanting to complete the study. No provocable maneuvers performed due to lack of cooperation. There was central MR, and the MV was noted to have severe MAC. 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 Would consider further characterization with Cardiac MR. Left ventricular ejection fraction, by estimation, is 55 to 60%. The left ventricle has normal function. The left ventricle has no regional wall motion abnormalities. The left ventricular internal cavity size was small. There is moderate asymmetric left ventricular hypertrophy of the basal-septal segment. Left ventricular diastolic function could not be evaluated due to mitral annular calcification (moderate or greater). Right Ventricle: The right ventricular size is normal. No increase in right ventricular wall thickness. Right ventricular systolic function is  normal. Left Atrium: Left atrial size was mildly dilated. Right Atrium: Right atrial size was normal in size. Pericardium: There is no evidence of pericardial effusion. Mitral Valve: The mitral valve is degenerative in appearance. Severe mitral annular calcification. Mild mitral valve regurgitation. Mild mitral valve stenosis. The mean mitral valve gradient is 3.0 mmHg with average heart rate of 77 bpm. Tricuspid Valve: The tricuspid valve is grossly normal. Tricuspid valve regurgitation is mild . No evidence of tricuspid stenosis. Aortic Valve: The aortic valve was not well visualized. Aortic valve regurgitation is not visualized. Mild aortic stenosis is present. Aortic valve mean gradient measures 11.5 mmHg. Aortic valve peak gradient measures 21.0 mmHg. Aortic valve area, by VTI  measures 1.76 cm. Pulmonic Valve: The pulmonic valve was not well visualized. Venous: The inferior vena cava was not well visualized. LEFT VENTRICLE PLAX 2D LVIDd:         2.40 cm  Diastology LVIDs:         1.80 cm  LV e' lateral:   6.66 cm/s LV PW:         0.80 cm  LV E/e' lateral: 12.2 LV IVS:        1.50 cm  LV e' medial:    5.34 cm/s LVOT diam:     1.70 cm  LV E/e' medial:  15.2 LV SV:         73 LV SV Index:   37 LVOT Area:     2.27 cm  LEFT ATRIUM           Index       RIGHT ATRIUM          Index LA diam:      3.20 cm 1.65 cm/m  RA Area:     9.98 cm LA Vol (A4C): 66.4 ml 34.14 ml/m RA Volume:   18.10 ml 9.30 ml/m  AORTIC VALVE AV Area (Vmax):    1.66 cm AV Area (Vmean):   1.58 cm AV Area (VTI):  1.76 cm AV Vmax:           229.00 cm/s AV Vmean:          157.000 cm/s AV VTI:            0.413 m AV Peak Grad:      21.0 mmHg AV Mean Grad:      11.5 mmHg LVOT Vmax:         167.00 cm/s LVOT Vmean:        109.000 cm/s LVOT VTI:          0.321 m LVOT/AV VTI ratio: 0.78  AORTA Ao Root diam: 2.40 cm MITRAL VALVE                TRICUSPID VALVE MV Area (PHT): 1.75 cm     TR Peak grad:   28.1 mmHg MV Area VTI:   2.28 cm     TR  Vmax:        265.00 cm/s MV Mean grad:  3.0 mmHg MV VTI:        0.32 m       SHUNTS MV Decel Time: 433 msec     Systemic VTI:  0.32 m MV E velocity: 81.40 cm/s   Systemic Diam: 1.70 cm MV A velocity: 106.00 cm/s MV E/A ratio:  0.77 Eleonore Chiquito MD Electronically signed by Eleonore Chiquito MD Signature Date/Time: 11/21/2019/4:39:49 PM    Final       Phillips Climes M.D on 11/24/2019 at 2:22 PM    [facility] -  Office  [phone]

## 2019-11-24 NOTE — ED Notes (Signed)
Food tray delivered. Pt refused any nourishment items from tray. Remains nonverbal but alert. Tracking movement with eyes and head. Freedom of movement within bed. Pt appears comfortable at this time. Call light attached to bed rail, pt instructed on how to call for assistance, if needed.

## 2019-11-24 NOTE — ED Notes (Signed)
Pt remains nonverbal at this time. RN asked pt if she wanted to take phone call however no response was received. Secretary notified of situation for future call routing.

## 2019-11-24 NOTE — ED Notes (Signed)
Tried to feed pt pt spit food out

## 2019-11-25 ENCOUNTER — Encounter (HOSPITAL_COMMUNITY): Payer: Self-pay | Admitting: Internal Medicine

## 2019-11-25 ENCOUNTER — Other Ambulatory Visit: Payer: Self-pay

## 2019-11-25 LAB — CBC
HCT: 31.3 % — ABNORMAL LOW (ref 36.0–46.0)
Hemoglobin: 9.9 g/dL — ABNORMAL LOW (ref 12.0–15.0)
MCH: 28.9 pg (ref 26.0–34.0)
MCHC: 31.6 g/dL (ref 30.0–36.0)
MCV: 91.3 fL (ref 80.0–100.0)
Platelets: 334 10*3/uL (ref 150–400)
RBC: 3.43 MIL/uL — ABNORMAL LOW (ref 3.87–5.11)
RDW: 14 % (ref 11.5–15.5)
WBC: 6.7 10*3/uL (ref 4.0–10.5)
nRBC: 0 % (ref 0.0–0.2)

## 2019-11-25 LAB — BASIC METABOLIC PANEL
Anion gap: 9 (ref 5–15)
BUN: 6 mg/dL — ABNORMAL LOW (ref 8–23)
CO2: 21 mmol/L — ABNORMAL LOW (ref 22–32)
Calcium: 8.9 mg/dL (ref 8.9–10.3)
Chloride: 106 mmol/L (ref 98–111)
Creatinine, Ser: 0.73 mg/dL (ref 0.44–1.00)
GFR calc Af Amer: >60 mL/min (ref >60)
GFR calc non Af Amer: >60 mL/min (ref >60)
Glucose, Bld: 93 mg/dL (ref 70–99)
Potassium: 3.5 mmol/L (ref 3.5–5.1)
Sodium: 136 mmol/L (ref 135–145)

## 2019-11-25 NOTE — Consult Note (Signed)
WOC Nurse Consult Note: Reason for Consult:Patient seen yesterday by wound treatment associate RN, Enrigue Catena.  Bilateral Pressure Redistribution heel boots are provided, a sacral foam has been provided to the Stage 2 pressure injury. Patient is being turned and repositioning from side to side. Wound type:Pressure Pressure Injury POA: Yes Measurement:See Nursing Flow Sheet Wound ZXY:DSWV, moist Drainage (amount, consistency, odor) scant serous Periwound:Intact Dressing procedure/placement/frequency:Sacral silicone foam. Prevalon boots. WOC Nursing consult is not required.  WOC nursing team will not follow, but will remain available to this patient, the nursing and medical teams.  Please re-consult if needed. Thanks, Ladona Mow, MSN, RN, GNP, [facility] [place]  Pager# [phone]

## 2019-11-25 NOTE — Progress Notes (Signed)
PROGRESS NOTE                                                                                                                                                                                                             Patient Demographics:    Robin Moreno, is a 86 y.o. female, DOB - April 19, 1925, IEP:[id number]  Admit date - 11/23/2019   Admitting Physician Albertine Patricia, MD  Outpatient Primary MD for the patient is Jilda Panda, MD  LOS - 1   Chief Complaint  Patient presents with  . Altered Mental Status       Brief Narrative    HPI : This is a 86 year old female who was just discharged she yesterday. She was found in a unresponsive state at home with agonal breathing. She was intubation for airway protection, extubated within 24 hours by PCCM. She was also found to have COVID infection.  Chest x-ray was negative.  She was clear and oriented at the time of discharge but thought to have some underlying mild dementia.  Per son patient was good when discharged but he was unable to wake her up this morning.  He let her sleep but when he was still unable to wake her at [facility] he decided to take her to the ER.  Per ER physician he was told by the son that despite being unable to fully awaken his mother he did give her home meds along with some cough medications. On presentation to the ER she was mildly arousable but still sleeping.  During my interview patient is nontoxic.  She arouses with little stimulation.  She does go immediately back to sleep.  She is not talkative.   Subjective:    Marthenia Rolling today is more awake, as discussed with staff no significant event, but her appetite has improved where she is able to be fed by her son.    Assessment  & Plan :    Active Problems:   COVID-19   Essential hypertension   Dyslipidemia   Altered mental status   Pulmonary embolism on right (HCC)   Somnolence   Failure to thrive in adult   Palliative  care by specialist   Goals of care, counseling/discussion   Advanced directives, counseling/discussion   DNI (do not intubate)   Concern about end of life   Encounter  for hospice care discussion  Failure to thrive/acute encephalopathy. -Patient appears to be with significant baseline dementia, with significantly fluctuating mental status, even during recent hospital stay, but overall she had stenting status at home . -Recent hospitalization with work-up including MRI with no acute findings, CT head with no acute finding this time as well. -Patient has improved today, was able to read some oral intake when son is feeding here.  As well she was able to take her meds today as well. -PT/OT consulted -On baclofen, gabapentin, they may be contributing to her symptoms, so they have been on hold.  . -Evidence of infectious process. -It is extremely frail, elderly and demented, palliative has been consulted, likely patient will be discharged home with hospice.  Recent history of COVID-19 infection -Does not require any COVID-19 isolation currently, she has been cleared through recent admission, she is on no isolation due to Covid.  Have discussed with ID Dr. Baxter Flattery. -Resolved active COVID-19 infection from several months ago.  During recent hospitalization she wasincidental Covid positive 11/16/2019, but cycle count over 42 no isolation needed.   Pressure ulcers, sacrum, bilateral heels -Wound care consult.  Wound care to stage -Chronically bedbound patient  Pulmonary embolism on right [facility]) -Diagnosed last admission.  D-dimer trending down -Stable, resume home meds  Atrial fibrillation -New diagnosis last admission.  Home meds resumed including Eliquis  Dyslipidemia -Stable, home meds   Goals of care -Later medicine consulted, likely will discharge home with hospice.Marland Kitchen    COVID-19 Labs  No results for input(s): DDIMER, FERRITIN, LDH, CRP in the last 72 hours.  Lab Results   Component Value Date   SARSCOV2NAA POSITIVE (A) 11/16/2019   SARSCOV2NAA POSITIVE (A) 07/30/2019   [place] NEGATIVE 09/30/2018   [place] NEGATIVE 09/18/2018     Code Status : Full  Family Communication  : D/W son by phone  Disposition Plan  :  Status is: Inpatient  Remains inpatient appropriate because:IV treatments appropriate due to intensity of illness or inability to take PO   Dispo: The patient is from: Home              Anticipated d/c is to: Home              Anticipated d/c date is: 2 days              Patient currently is not medically stable to d/c.       Consults  :  palliative  Procedures  : None  DVT Prophylaxis  :  On Eliquis  Lab Results  Component Value Date   PLT 334 11/25/2019    Antibiotics  :    Anti-infectives (From admission, onward)   None        Objective:   Vitals:   11/24/19 1600 11/24/19 1708 11/24/19 2033 11/25/19 0553  BP: (!) 145/108 (!) 174/94 (!) 156/90 (!) 142/94  Pulse: 82 91 80 86  Resp:  _0 Temp:  98.4 F (36.9 C) 98.7 F (37.1 C) 98.1 F (36.7 C)  TempSrc:  Oral Axillary Oral  SpO2: 99% 99% 99% 98%  Weight:      Height:        Wt Readings from Last 3 Encounters:  11/24/19 54 kg  11/22/19 84.1 kg  09/01/19 70 kg     Intake/Output Summary (Last 24 hours) at 11/25/2019 1420 Last data filed at 11/25/2019 1039 Gross per 24 hour  Intake 869.69 ml  Output 550 ml  Net  319.69 ml     Physical Exam  She is awake, extremely frail and deconditioned, does not follow commands or answer questions appropriately  Symmetrical Chest wall movement, Good air movement bilaterally, CTAB RRR,No Gallops,Rubs or new Murmurs, No Parasternal Heave +ve B.Sounds, Abd Soft, No tenderness,  No rebound - guarding or rigidity. No Cyanosis, Clubbing or edema, No new Rash or bruise     Data Review:    CBC Recent Labs  Lab 11/21/19 0726 11/22/19 0214 11/23/19 1830 11/24/19 0841 11/25/19 0252  WBC 7.7 6.3  5.8 7.6 6.7  HGB 10.2* 10.0* 10.3* 10.7* 9.9*  HCT 31.5* 30.8* 33.9* 34.6* 31.3*  PLT 262 234 304 322 334  MCV 90.0 93.1 95.5 93.0 91.3  MCH 29.1 30.2 29.0 28.8 28.9  MCHC 32.4 32.5 30.4 30.9 31.6  RDW 13.3 13.5 13.9 13.8 14.0  LYMPHSABS 1.6 1.5 1.8  --   --   MONOABS 0.7 0.6 0.5  --   --   EOSABS 0.3 0.3 0.2  --   --   BASOSABS 0.1 0.1 0.0  --   --     Chemistries  Recent Labs  Lab 11/21/19 0726 11/22/19 0214 11/23/19 1830 11/24/19 0916 11/25/19 0252  NA 141 139 141 139 136  K 3.1* 4.1 3.6 3.5 3.5  CL 113* 111 111 107 106  CO2 16* 18* 20* 19* 21*  GLUCOSE 126* 119* 105* 105* 93  BUN 8 8 7* 7* 6*  CREATININE 0.70 0.70 0.84 0.78 0.73  CALCIUM 8.9 8.9 9.3 9.3 8.9  MG 1.6* 2.0  --   --   --   AST 39 36 37  --   --   ALT _0 --   --   ALKPHOS 72 70 72  --   --   BILITOT 0.8 0.3 0.8  --   --    ------------------------------------------------------------------------------------------------------------------ No results for input(s): CHOL, HDL, LDLCALC, TRIG, CHOLHDL, LDLDIRECT in the last 72 hours.  Lab Results  Component Value Date   HGBA1C 5.6 11/18/2019   ------------------------------------------------------------------------------------------------------------------ No results for input(s): TSH, T4TOTAL, T3FREE, THYROIDAB in the last 72 hours.  Invalid input(s): FREET3 ------------------------------------------------------------------------------------------------------------------ No results for input(s): VITAMINB12, FOLATE, FERRITIN, TIBC, IRON, RETICCTPCT in the last 72 hours.  Coagulation profile No results for input(s): INR, PROTIME in the last 168 hours.  No results for input(s): DDIMER in the last 72 hours.  Cardiac Enzymes No results for input(s): CKMB, TROPONINI, MYOGLOBIN in the last 168 hours.  Invalid input(s): CK ------------------------------------------------------------------------------------------------------------------    Component  Value Date/Time   BNP 175.9 (H) 11/22/2019 0214    Inpatient Medications  Scheduled Meds: . apixaban  10 mg Oral BID   Followed by  . [START ON 11/28/2019] apixaban  5 mg Oral BID  . metoprolol tartrate  25 mg Oral BID  . pantoprazole (PROTONIX) IV  40 mg Intravenous q morning - 10a   Continuous Infusions: . 0.9 % NaCl with KCl 20 mEq / L 50 mL/hr at 11/25/19 1253   PRN Meds:.acetaminophen **OR** acetaminophen, senna-docusate  Micro Results Recent Results (from the past 240 hour(s))  SARS Coronavirus 2 by RT PCR (hospital order, performed in [facility] hospital lab) Nasopharyngeal Nasopharyngeal Swab     Status: Abnormal   Collection Time: 11/16/19  5:02 PM   Specimen: Nasopharyngeal Swab  Result Value Ref Range Status   SARS Coronavirus 2 POSITIVE (A) NEGATIVE Final    Comment: RESULT CALLED TO, READ BACK BY AND VERIFIED WITH:  L MEEKS RN 11/16/19 AT 1907 SK (NOTE) SARS-CoV-2 target nucleic acids are DETECTED  SARS-CoV-2 RNA is generally detectable in upper respiratory specimens  during the acute phase of infection.  Positive results are indicative  of the presence of the identified virus, but do not rule out bacterial infection or co-infection with other pathogens not detected by the test.  Clinical correlation with patient history and  other diagnostic information is necessary to determine patient infection status.  The expected result is negative.  Fact Sheet for Patients:   StrictlyIdeas.no   Fact Sheet for Healthcare Providers:   BankingDealers.co.za    This test is not yet approved or cleared by the [place] FDA and  has been authorized for detection and/or diagnosis of SARS-CoV-2 by FDA under an Emergency Use Authorization (EUA).  This EUA will remain in effect (meaning this test ca n be used) for the duration of  the COVID-19 declaration under Section 564(b)(1) of the Act, 21 U.S.C. section 360-bbb-3(b)(1),  unless the authorization is terminated or revoked sooner.  Performed at [facility], [place] [address], [place], [place] [address]   MRSA PCR Screening     Status: None   Collection Time: 11/18/19  1:01 AM   Specimen: Urine, Catheterized; Nasopharyngeal  Result Value Ref Range Status   MRSA by PCR NEGATIVE NEGATIVE Final    Comment:        The GeneXpert MRSA Assay (FDA approved for NASAL specimens only), is one component of a comprehensive MRSA colonization surveillance program. It is not intended to diagnose MRSA infection nor to guide or monitor treatment for MRSA infections. Performed at [facility], [place] [address] [place], [place] [address]   Culture, blood (routine x 2)     Status: None   Collection Time: 11/18/19 10:29 AM   Specimen: BLOOD  Result Value Ref Range Status   Specimen Description BLOOD RIGHT ANTECUBITAL  Final   Special Requests   Final    BOTTLES DRAWN AEROBIC AND ANAEROBIC Blood Culture results may not be optimal due to an inadequate volume of blood received in culture bottles   Culture   Final    NO GROWTH 5 DAYS Performed at [facility], [place] [address], [place], [place] [address]    Report Status 11/23/2019 FINAL  Final  Culture, blood (routine x 2)     Status: None   Collection Time: 11/18/19 10:29 AM   Specimen: BLOOD RIGHT HAND  Result Value Ref Range Status   Specimen Description BLOOD RIGHT HAND  Final   Special Requests AEROBIC BOTTLE ONLY Blood Culture adequate volume  Final   Culture   Final    NO GROWTH 5 DAYS Performed at [facility], [place] [address] [place], [place] [address]    Report Status 11/23/2019 FINAL  Final    Radiology Reports CT Head Wo Contrast  Result Date: 11/23/2019 CLINICAL DATA:  Altered mental status. Progressive altered mental status for 2 days. Recent hospital discharge after fall. EXAM: CT HEAD WITHOUT CONTRAST TECHNIQUE: Contiguous axial images were obtained from the base of the skull  through the vertex without intravenous contrast. COMPARISON:  Head CT 11/17/2019, brain MRI 10/29/2019 FINDINGS: Brain: Mild motion artifact limitations on the current exam. Stable degree of atrophy and chronic small vessel ischemia. Again seen remote left frontoparietal infarct. No intracranial hemorrhage, mass effect, or midline shift. No hydrocephalus. The basilar cisterns are patent. No evidence of territorial infarct or acute ischemia. No  extra-axial or intracranial fluid collection. Vascular: Atherosclerosis of skullbase vasculature without hyperdense vessel or abnormal calcification. Skull: No fracture or focal lesion. Sinuses/Orbits: Paranasal sinuses and mastoid air cells are clear. The visualized orbits are unremarkable. Bilateral cataract resection. Other: None. IMPRESSION: 1. No acute intracranial abnormality, allowing for motion artifact on the current exam. 2. Stable atrophy, chronic small vessel ischemia, and remote left frontoparietal infarct. Electronically Signed   By: Keith Rake M.D.   On: 11/23/2019 19:03   CT HEAD WO CONTRAST  Result Date: 11/17/2019 CLINICAL DATA:  Altered level of consciousness, trauma, unresponsive, found down EXAM: CT HEAD WITHOUT CONTRAST CT CERVICAL SPINE WITHOUT CONTRAST TECHNIQUE: Multidetector CT imaging of the head and cervical spine was performed following the standard protocol without intravenous contrast. Multiplanar CT image reconstructions of the cervical spine were also generated. COMPARISON:  08/31/2019 FINDINGS: CT HEAD FINDINGS Brain: Hypodensity left parietal cortex consistent with chronic infarct, stable. No signs of acute infarct or hemorrhage. Lateral ventricles and midline structures are unremarkable. No acute extra-axial fluid collections. No mass effect. Vascular: No hyperdense vessel or unexpected calcification. Skull: Normal. Negative for fracture or focal lesion. Sinuses/Orbits: No acute finding. Other: None. CT CERVICAL SPINE FINDINGS  Alignment: Alignment is anatomic. Skull base and vertebrae: No acute displaced fracture. Soft tissues and spinal canal: Patient is intubated, endotracheal tube at level of thoracic inlet. Enteric catheter is identified, distal margin excluded by slice selection. Prevertebral soft tissues are unremarkable. No visible canal hematoma. Disc levels: Stable diffuse facet hypertrophy. Mild spondylosis at C3-4 and C6-7. There is mild diffuse neural foraminal encroachment related to facet hypertrophic change. Findings are stable. Upper chest: Lung apices are clear. Other: Reconstructed images demonstrate no additional findings. IMPRESSION: 1. No acute intracranial process. 2. Stable chronic left parietal infarct. 3. No acute cervical spine fracture. Electronically Signed   By: Randa Ngo M.D.   On: 11/17/2019 18:36   CT ANGIO CHEST PE W OR WO CONTRAST  Result Date: 11/20/2019 CLINICAL DATA:  86 year old female with shortness of breath. Positive COVID-19. Concern for pulmonary embolism. EXAM: CT ANGIOGRAPHY CHEST WITH CONTRAST TECHNIQUE: Multidetector CT imaging of the chest was performed using the standard protocol during bolus administration of intravenous contrast. Multiplanar CT image reconstructions and MIPs were obtained to evaluate the vascular anatomy. CONTRAST:  72m OMNIPAQUE IOHEXOL 350 MG/ML SOLN COMPARISON:  Chest CT dated 11/17/2019. FINDINGS: Evaluation is very limited due to respiratory motion artifact and streak artifact caused by patient's arms. Cardiovascular: There is no cardiomegaly or pericardial effusion. Three-vessel coronary vascular calcification. There is moderate atherosclerotic calcification of the thoracic aorta. Evaluation of the pulmonary arteries is limited due to respiratory motion artifact. There is a band like density in the right upper lobe pulmonary artery branch (94/6) which may be artifactual and represent a branch point or a nonocclusive thrombus. Additional apparent filling  defects in the subsegmental branches of the right lower lobe (1710/6) may represent pulmonary emboli. Evaluation is very limited due to severe motion artifact. V/Q scan may provide better evaluation if clinically indicated. No large central pulmonary artery embolus identified. Mediastinum/Nodes: There is no hilar or mediastinal adenopathy. The esophagus is grossly unremarkable. No mediastinal fluid collection. Lungs/Pleura: Small bilateral pleural effusions. There is an area of consolidative change involving the right lower lobe similar or slightly progressed since the prior CT which may represent atelectasis or pneumonia. Linear left lung base atelectasis noted. No pneumothorax. The central airways are patent. Upper Abdomen: No acute abnormality. Musculoskeletal: Osteopenia with degenerative changes of  the spine. No acute osseous pathology. Old anterior rib fractures. Review of the MIP images confirms the above findings. IMPRESSION: 1. Very limited study due to respiratory motion artifact and streak artifact caused by patient's arms. 2. Probable small right upper lobe and right lower lobe subsegmental pulmonary emboli. No large central pulmonary artery embolus identified. V/Q scan may provide better evaluation if clinically indicated. 3. Small bilateral pleural effusions with an area of consolidative change involving the right lower lobe similar or slightly progressed since the prior CT which may represent atelectasis or pneumonia. 4. Aortic Atherosclerosis (ICD10-I70.0). These results were called by telephone at the time of interpretation on 11/20/2019 at 7:51 pm to Dr. Myna Hidalgo, who verbally acknowledged these results. Electronically Signed   By: Anner Crete M.D.   On: 11/20/2019 20:08   CT Angio Chest PE W and/or Wo Contrast  Result Date: 11/17/2019 CLINICAL DATA:  Unresponsive, axonal breathing. EXAM: CT ANGIOGRAPHY CHEST WITH CONTRAST TECHNIQUE: Multidetector CT imaging of the chest was performed using the  standard protocol during bolus administration of intravenous contrast. Multiplanar CT image reconstructions and MIPs were obtained to evaluate the vascular anatomy. CONTRAST:  83m OMNIPAQUE IOHEXOL 350 MG/ML SOLN COMPARISON:  Chest x-ray same date., remote chest CT from 2005 FINDINGS: Cardiovascular: Calcified atheromatous plaque in the thoracic aorta. Calcifications of the aortic valve. Mitral annular calcifications. No aneurysmal dilation of the thoracic aorta. Limited assessment of the aorta due to bolus timing. Contour is smooth. No pericardial effusion. Heart size is normal. Central pulmonary vascular caliber is normal. Study quality is good. No pulmonary embolism. Mediastinum/Nodes: Endotracheal tube in place terminating just at the thoracic inlet. Balloon inflation just below the larynx. This was previously communicated. Gastric tube passes through the esophagus into the stomach. No adenopathy at the thoracic inlet. No mediastinal adenopathy. No hilar adenopathy. Lungs/Pleura: Basilar airspace disease. No filling defects or fluid in bronchi. No pneumothorax. No lobar consolidation. No pleural effusion. Upper Abdomen: Gallbladder is distended, moderately with gallstones in the dependent portion. No pericholecystic stranding. No peripancreatic stranding. Adrenal glands are normal. No acute process in the incompletely imaged upper abdomen. Musculoskeletal: No acute bone process. No chest wall lesion. No destructive bone finding. Glenohumeral and spinal degenerative changes. Review of the MIP images confirms the above findings. IMPRESSION: 1. Negative for pulmonary embolism. 2. Basilar airspace disease, likely atelectasis. 3. Endotracheal tube in place terminating just at the thoracic inlet. Balloon inflation just below the larynx. This was previously communicated as outlined on previous chest radiograph. 4. Cholelithiasis without evidence of acute cholecystitis. 5. Aortic atherosclerosis. Aortic Atherosclerosis  (ICD10-I70.0). Electronically Signed   By: GZetta BillsM.D.   On: 11/17/2019 18:46   CT Cervical Spine Wo Contrast  Result Date: 11/17/2019 CLINICAL DATA:  Altered level of consciousness, trauma, unresponsive, found down EXAM: CT HEAD WITHOUT CONTRAST CT CERVICAL SPINE WITHOUT CONTRAST TECHNIQUE: Multidetector CT imaging of the head and cervical spine was performed following the standard protocol without intravenous contrast. Multiplanar CT image reconstructions of the cervical spine were also generated. COMPARISON:  08/31/2019 FINDINGS: CT HEAD FINDINGS Brain: Hypodensity left parietal cortex consistent with chronic infarct, stable. No signs of acute infarct or hemorrhage. Lateral ventricles and midline structures are unremarkable. No acute extra-axial fluid collections. No mass effect. Vascular: No hyperdense vessel or unexpected calcification. Skull: Normal. Negative for fracture or focal lesion. Sinuses/Orbits: No acute finding. Other: None. CT CERVICAL SPINE FINDINGS Alignment: Alignment is anatomic. Skull base and vertebrae: No acute displaced fracture. Soft tissues and spinal canal:  Patient is intubated, endotracheal tube at level of thoracic inlet. Enteric catheter is identified, distal margin excluded by slice selection. Prevertebral soft tissues are unremarkable. No visible canal hematoma. Disc levels: Stable diffuse facet hypertrophy. Mild spondylosis at C3-4 and C6-7. There is mild diffuse neural foraminal encroachment related to facet hypertrophic change. Findings are stable. Upper chest: Lung apices are clear. Other: Reconstructed images demonstrate no additional findings. IMPRESSION: 1. No acute intracranial process. 2. Stable chronic left parietal infarct. 3. No acute cervical spine fracture. Electronically Signed   By: Randa Ngo M.D.   On: 11/17/2019 18:36   MR BRAIN WO CONTRAST  Result Date: 11/18/2019 CLINICAL DATA:  Altered mental status with unknown cause. Chronic kidney disease  EXAM: MRI HEAD WITHOUT CONTRAST TECHNIQUE: Multiplanar, multiecho pulse sequences of the brain and surrounding structures were obtained without intravenous contrast. COMPARISON:  Head CT from yesterday FINDINGS: Brain: No acute infarction, hemorrhage, hydrocephalus, extra-axial collection or mass lesion. Remote left frontal parietal infarct with hemosiderin deposition, small. Chronic small vessel ischemia in the cerebral white matter and pons, moderately extensive. Prominent cortical spinal radiations on diffusion imaging attributed to anisotropy rather than pathology. Age normal brain volume. Vascular: Preserved flow voids Skull and upper cervical spine: No focal marrow lesion. Degenerative facet spurring with C3-4 facet ankylosis. Sinuses/Orbits: Bilateral cataract resection IMPRESSION: 1. No emergent or reversible finding. 2. Senescent changes and small remote left frontal parietal cortex infarct. Electronically Signed   By: Monte Fantasia M.D.   On: 11/18/2019 07:26   DG Chest Port 1 View  Result Date: 11/23/2019 CLINICAL DATA:  Altered mental status. EXAM: PORTABLE CHEST 1 VIEW COMPARISON:  November 17, 2019 FINDINGS: The endotracheal tube and nasogastric tube seen on the prior study have been removed. Multiple overlying cardiac lead wires are seen. Mild, diffuse chronic appearing increased interstitial lung markings are noted. There is no evidence of acute infiltrate, pleural effusion or pneumothorax. The heart size and mediastinal contours are within normal limits. Degenerative changes seen throughout the thoracic spine. IMPRESSION: Chronic appearing increased interstitial lung markings without evidence of acute or active cardiopulmonary disease. Electronically Signed   By: Virgina Norfolk M.D.   On: 11/23/2019 17:20   DG Chest Port 1 View  Result Date: 11/17/2019 CLINICAL DATA:  Tube adjustment. COVID positive patient, found unresponsive EXAM: PORTABLE CHEST 1 VIEW COMPARISON:  Chest x-ray November 17, 2019 FINDINGS: Interval advancement of the endotracheal tube now approximately 5 cm above the carina. Gastric tube in place with tip in the stomach in the upper abdomen. Cardiomediastinal contours are stable. Lungs are clear. Visualized skeletal structures on limited assessment are unremarkable. IMPRESSION: No acute cardiopulmonary disease with interval advancement of endotracheal tube now approximately 5 cm above the carina. Electronically Signed   By: Zetta Bills M.D.   On: 11/17/2019 19:30   DG Chest Port 1 View  Result Date: 11/17/2019 CLINICAL DATA:  Respiratory failure EXAM: PORTABLE CHEST 1 VIEW COMPARISON:  11/16/2019 FINDINGS: Endotracheal tube approximately 6.8 cm above the carina. Tip is above the level of the clavicular heads. Gastric tube in the stomach. Cardiomediastinal contours are unchanged, likely normal accounting for low depth of expansion and projection. Lungs are clear. No acute skeletal process on limited assessment. IMPRESSION: 1. No acute cardiopulmonary process on limited assessment. 2. Endotracheal tube, tip above the clavicular heads approximately 7 cm from the carina. Consider slight advancement approximately 2 cm with repeat radiography to document placement. 3. These results were called by telephone at the time of interpretation  on 11/17/2019 at 6:22 pm to provider [facility] , who verbally acknowledged these results. Electronically Signed   By: Zetta Bills M.D.   On: 11/17/2019 18:22   DG Chest Port 1 View  Result Date: 11/16/2019 CLINICAL DATA:  Bilateral lower extremity spasms for 1 week. EXAM: PORTABLE CHEST 1 VIEW COMPARISON:  Single-view of the chest 09/01/2019. FINDINGS: Lungs are clear. Heart size is normal. No pneumothorax or pleural effusion. Aortic atherosclerosis. No acute or focal bony abnormality. IMPRESSION: No acute disease. Aortic Atherosclerosis (ICD10-I70.0). Electronically Signed   By: Inge Rise M.D.   On: 11/16/2019 16:32   VAS [place]  LOWER EXTREMITY VENOUS (DVT)  Result Date: 11/22/2019  Lower Venous DVTStudy Indications: Rapidly rising ddimer.  Comparison Study: no prior Performing Technologist: Abram Sander RVS  Examination Guidelines: A complete evaluation includes B-mode imaging, spectral Doppler, color Doppler, and power Doppler as needed of all accessible portions of each vessel. Bilateral testing is considered an integral part of a complete examination. Limited examinations for reoccurring indications may be performed as noted. The reflux portion of the exam is performed with the patient in reverse Trendelenburg.  +---------+---------------+---------+-----------+----------+--------------+ RIGHT    CompressibilityPhasicitySpontaneityPropertiesThrombus Aging +---------+---------------+---------+-----------+----------+--------------+ CFV      Full           Yes      Yes                                 +---------+---------------+---------+-----------+----------+--------------+ SFJ      Full                                                        +---------+---------------+---------+-----------+----------+--------------+ FV Prox  Full                                                        +---------+---------------+---------+-----------+----------+--------------+ FV Mid   Full                                                        +---------+---------------+---------+-----------+----------+--------------+ FV DistalFull                                                        +---------+---------------+---------+-----------+----------+--------------+ PFV      Full                                                        +---------+---------------+---------+-----------+----------+--------------+ POP      Full           Yes      Yes                                 +---------+---------------+---------+-----------+----------+--------------+  PTV      Full                                                         +---------+---------------+---------+-----------+----------+--------------+ PERO                                                  Not visualized +---------+---------------+---------+-----------+----------+--------------+   +---------+---------------+---------+-----------+----------+--------------+ LEFT     CompressibilityPhasicitySpontaneityPropertiesThrombus Aging +---------+---------------+---------+-----------+----------+--------------+ CFV      Full           Yes      Yes                                 +---------+---------------+---------+-----------+----------+--------------+ SFJ      Full                                                        +---------+---------------+---------+-----------+----------+--------------+ FV Prox  Full                                                        +---------+---------------+---------+-----------+----------+--------------+ FV Mid   Full                                                        +---------+---------------+---------+-----------+----------+--------------+ FV DistalFull                                                        +---------+---------------+---------+-----------+----------+--------------+ PFV      Full                                                        +---------+---------------+---------+-----------+----------+--------------+ POP      Full           Yes      Yes                                 +---------+---------------+---------+-----------+----------+--------------+ PTV      Full                                                        +---------+---------------+---------+-----------+----------+--------------+  PERO     Full                                                        +---------+---------------+---------+-----------+----------+--------------+     Summary: BILATERAL: - No evidence of deep vein thrombosis seen in the lower extremities,  bilaterally. - No evidence of superficial venous thrombosis in the lower extremities, bilaterally. -   *See table(s) above for measurements and observations. Electronically signed by Monica Martinez MD on 11/22/2019 at 3:12:12 PM.    Final    ECHOCARDIOGRAM LIMITED  Result Date: 11/21/2019    ECHOCARDIOGRAM LIMITED REPORT   Patient Name:   BRIELLA HOBDAY Date of Exam: 11/21/2019 Medical Rec #:  [id number]     Height:       66.0 in Accession #:    [id number]    Weight:       187.4 lb Date of Birth:  11-17-1925      BSA:          1.945 m Patient Age:    81 years      BP:           144/69 mmHg Patient Gender: F             HR:           77 bpm. Exam Location:  Inpatient Procedure: Limited Echo, Limited Color Doppler and Cardiac Doppler Indications:    CHF  History:        Patient has prior history of Echocardiogram examinations, most                 recent 10/10/2012. Covid Positive; Risk Factors:Hypertension.  Sonographer:    Mikki Santee RDCS (AE) Referring Phys: [id number] Margaree Mackintosh [facility]  Sonographer Comments: Image acquisition challenging due to uncooperative patient. IMPRESSIONS  1. There is moderate asymmetric hypertrophy of the basal septum up to 1.5 cm. There is a gradient across the LVOT up to 27 mmHG. No clear SAM of the MV was observed, but this study was low quality due to patient not wanting to complete the study. No provocable maneuvers performed due to lack of cooperation. There was central MR, and the MV was noted to have severe MAC. 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 Would consider further characterization with Cardiac MR.  Left ventricular ejection fraction, by estimation, is 55 to 60%. The left ventricle has normal function. The left ventricle has no regional wall motion abnormalities. There is moderate asymmetric left ventricular hypertrophy of the basal-septal segment.  Left ventricular diastolic function could not be evaluated.  2. Right ventricular  systolic function is normal. The right ventricular size is normal.  3. Left atrial size was mildly dilated.  4. The mitral valve is degenerative. Mild mitral valve regurgitation. Mild mitral stenosis. The mean mitral valve gradient is 3.0 mmHg with average heart rate of 77 bpm.  5. The aortic valve was not well visualized. Aortic valve regurgitation is not visualized. Mild aortic valve stenosis. Aortic valve area, by VTI measures 1.76 cm. Aortic valve mean gradient measures 11.5 mmHg. Aortic valve Vmax measures 2.29 m/s. FINDINGS  Left Ventricle: There is moderate asymmetric hypertrophy of the basal septum up to 1.5 cm. There is a gradient across the LVOT up to 27 mmHG. No clear SAM of the MV was  observed, but this study was low quality due to patient not wanting to complete the study. No provocable maneuvers performed due to lack of cooperation. There was central MR, and the MV was noted to have severe MAC. 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 Would consider further characterization with Cardiac MR. Left ventricular ejection fraction, by estimation, is 55 to 60%. The left ventricle has normal function. The left ventricle has no regional wall motion abnormalities. The left ventricular internal cavity size was small. There is moderate asymmetric left ventricular hypertrophy of the basal-septal segment. Left ventricular diastolic function could not be evaluated due to mitral annular calcification (moderate or greater). Right Ventricle: The right ventricular size is normal. No increase in right ventricular wall thickness. Right ventricular systolic function is normal. Left Atrium: Left atrial size was mildly dilated. Right Atrium: Right atrial size was normal in size. Pericardium: There is no evidence of pericardial effusion. Mitral Valve: The mitral valve is degenerative in appearance. Severe mitral annular calcification. Mild mitral valve regurgitation. Mild mitral valve  stenosis. The mean mitral valve gradient is 3.0 mmHg with average heart rate of 77 bpm. Tricuspid Valve: The tricuspid valve is grossly normal. Tricuspid valve regurgitation is mild . No evidence of tricuspid stenosis. Aortic Valve: The aortic valve was not well visualized. Aortic valve regurgitation is not visualized. Mild aortic stenosis is present. Aortic valve mean gradient measures 11.5 mmHg. Aortic valve peak gradient measures 21.0 mmHg. Aortic valve area, by VTI  measures 1.76 cm. Pulmonic Valve: The pulmonic valve was not well visualized. Venous: The inferior vena cava was not well visualized. LEFT VENTRICLE PLAX 2D LVIDd:         2.40 cm  Diastology LVIDs:         1.80 cm  LV e' lateral:   6.66 cm/s LV PW:         0.80 cm  LV E/e' lateral: 12.2 LV IVS:        1.50 cm  LV e' medial:    5.34 cm/s LVOT diam:     1.70 cm  LV E/e' medial:  15.2 LV SV:         73 LV SV Index:   37 LVOT Area:     2.27 cm  LEFT ATRIUM           Index       RIGHT ATRIUM          Index LA diam:      3.20 cm 1.65 cm/m  RA Area:     9.98 cm LA Vol (A4C): 66.4 ml 34.14 ml/m RA Volume:   18.10 ml 9.30 ml/m  AORTIC VALVE AV Area (Vmax):    1.66 cm AV Area (Vmean):   1.58 cm AV Area (VTI):     1.76 cm AV Vmax:           229.00 cm/s AV Vmean:          157.000 cm/s AV VTI:            0.413 m AV Peak Grad:      21.0 mmHg AV Mean Grad:      11.5 mmHg LVOT Vmax:         167.00 cm/s LVOT Vmean:        109.000 cm/s LVOT VTI:          0.321 m LVOT/AV VTI ratio: 0.78  AORTA Ao Root diam: 2.40 cm MITRAL VALVE  TRICUSPID VALVE MV Area (PHT): 1.75 cm     TR Peak grad:   28.1 mmHg MV Area VTI:   2.28 cm     TR Vmax:        265.00 cm/s MV Mean grad:  3.0 mmHg MV VTI:        0.32 m       SHUNTS MV Decel Time: 433 msec     Systemic VTI:  0.32 m MV E velocity: 81.40 cm/s   Systemic Diam: 1.70 cm MV A velocity: 106.00 cm/s MV E/A ratio:  0.77 Eleonore Chiquito MD Electronically signed by Eleonore Chiquito MD Signature Date/Time:  11/21/2019/4:39:49 PM    Final       Phillips Climes M.D on 11/25/2019 at 2:20 PM    [facility] -  Office  [phone]

## 2019-11-25 NOTE — Progress Notes (Signed)
Daily Progress Note   Patient Name: Robin Moreno       Date: 11/25/2019 DOB: Apr 30, 1925  Age: 86 y.o. MRN#: [id number] Attending Physician: Starleen Arms, MD Primary Care Physician: Ralene Ok, MD Admit Date: 11/23/2019  Reason for Consultation/Follow-up: Establishing goals of care  Subjective: Chart review performed. Received report from primary RN - no acute concerns. She reports the patient is not eating/drinking much.  Went to visit patient at bedside - son/Daryle present. Patient was lying in bed awake, alert, and eating breakfast as Daryle was feeding her. She seemed to tolerate eating and drinking well this morning and was receptive to food and drink - she did not spit any of it out. She responded verbally today with minimal responses - "hey" and "what."  Continued GOC discussion with Daryle. He is encouraged that she is more awake, alert, and responsive today.  Discussed that the patient is still very frail, deconditioned, and at a high risk of rehospitalization and discussed what this might mean for her care going forward. Discussed again aggressive medical interventions vs a comfort path in light of the patient's current medical condition. Hospice services and outpatient Palliative Care were offered again and explained in detail. Hospice philosophy was explained again in detail - Daryle was agreeable to home hospice.  Advance directives, concepts specific to code status, artificial feeding and hydration, and rehospitalization were considered and discussed. Daryle brought the patient's previously completed DNR form - I made copy for the medical record. He was not able to bring patient's living will as he stated it is in his safety deposit box. MOST form was reviewed with Daryle -  discussed that some of the information on the form might be in her living will and it would be important to know what it says, so we can respect her wishes - he wanted time to review it before completing. Blank MOST form left at bedside for his review.  After visit with the patient and son, TOC reached out to Daryle to set up home hospice on discharge. He was agreeable, however, wanted to continue home health PT. TOC reiterated that the patient would not receive home PT under hospice services. He was agreeable to outpatient Palliative Care to follow instead of home hospice at that time.  Addressed all questions/concerns. Encouraged to call  with questions/concerns. PMT card was provided.   Length of Stay: 1  Current Medications: Scheduled Meds:  . apixaban  10 mg Oral BID   Followed by  . [START ON 11/28/2019] apixaban  5 mg Oral BID  . metoprolol tartrate  25 mg Oral BID  . pantoprazole (PROTONIX) IV  40 mg Intravenous q morning - 10a    Continuous Infusions: . 0.9 % NaCl with KCl 20 mEq / L 50 mL/hr at 11/25/19 1253    PRN Meds: acetaminophen **OR** acetaminophen, senna-docusate  Physical Exam Vitals and nursing note reviewed.  Constitutional:      General: She is not in acute distress. Pulmonary:     Effort: No respiratory distress.  Neurological:     Mental Status: She is alert. She is disoriented.     Motor: Weakness present.  Psychiatric:        Speech: She is noncommunicative. Speech is delayed.        Behavior: Behavior is cooperative.        Cognition and Memory: Cognition is impaired. Memory is impaired.             Vital Signs: BP (!) 160/85 (BP Location: Left Arm)   Pulse 70   Temp 97.6 F (36.4 C)   Resp 17   Ht 5\' 6"  (1.676 m)   Wt 54 kg   SpO2 98%   BMI 19.21 kg/m  SpO2: SpO2: 98 % O2 Device: O2 Device: Room Air O2 Flow Rate:    Intake/output summary:   Intake/Output Summary (Last 24 hours) at 11/25/2019 1551 Last data filed at 11/25/2019  1039 Gross per 24 hour  Intake 869.69 ml  Output 550 ml  Net 319.69 ml   LBM:   Baseline Weight: Weight: 54 kg Most recent weight: Weight: 54 kg       Palliative Assessment/Data: PPS 30%    Flowsheet Rows     Most Recent Value  Intake Tab  Referral Department Hospitalist  Unit at Time of Referral ER  Palliative Care Primary Diagnosis Nephrology  [failure to thrive]  Date Notified 11/24/19  Palliative Care Type New Palliative care  Reason for referral Clarify Goals of Care  Date of Admission 11/23/19  Date first seen by Palliative Care 11/24/19  # of days Palliative referral response time 0 Day(s)  # of days IP prior to Palliative referral 1  Clinical Assessment  Psychosocial & Spiritual Assessment  Palliative Care Outcomes  Patient/Family meeting held? Yes  Who was at the meeting? son  Palliative Care Outcomes Changed CPR status, Provided psychosocial or spiritual support, Provided advance care planning, Counseled regarding hospice, Clarified goals of care  Patient/Family wishes: Interventions discontinued/not started  PEG      Patient Active Problem List   Diagnosis Date Noted  . Failure to thrive in adult 11/24/2019  . Palliative care by specialist   . Goals of care, counseling/discussion   . Advanced directives, counseling/discussion   . DNI (do not intubate)   . Concern about end of life   . Encounter for hospice care discussion   . Somnolence 11/23/2019  . Pulmonary embolism on right (HCC) 11/20/2019  . Pressure injury of skin 11/18/2019  . Respiratory distress 11/17/2019  . Altered mental status   . Respiratory arrest (HCC)   . Facial twitching 09/01/2019  . Stage 3a chronic kidney disease 09/01/2019  . DNR (do not resuscitate) 09/01/2019  . COVID-19 07/30/2019  . Essential hypertension 07/30/2019  . Dyslipidemia 07/30/2019  .  Pre-syncope 07/30/2019  . Viral gastroenteritis due to [place] agent 09/19/2018  . Acute kidney injury superimposed on CKD  (HCC) 09/18/2018  . Diarrhea 09/18/2018  . Urinary tract infection 09/18/2018  . Generalized weakness 09/18/2018  . Arthritis 09/18/2018    Palliative Care Assessment & Plan   Patient Profile: 86 y.o. female  with past medical history of CKD stage 3b, hypertention, CVA, possible dementia, chronically bed bound, newly diagnosed atrial fibrillation admitted on 11/23/2019 with failure to thrive and encephalopathy. She is being admitted within 24 hours of previous discharge.  She was recently hospitalized from 8/21-8/26/21 due to respiratory arrest, which required intubation for less than 24 hours. She was diagnosed with atrial fibrillation and PE during that admission as well.   Assessment: Altered mental status Essential hypertension Pulmonary embolism on right Somnolence Failure to thrive Pressure ulcers Atrial fibrillation  Recommendations/Plan: Continue full scope medical treatment Continue DNR/DNI as previously documented - DNR form copy was obtained today and will be scanned into [employer]. Son is open to outpatient Palliative Care to follow patient at discharge - TOC was consulted and notified. Depending on hospital course he also may be open to hospice, but he currently wants the patient to continue home PT. Continue to offer son education about hospice philosophy as he still seems unsure/processing information Help son complete MOST form if he becomes interested; blank form left with him for review Obtain copy of Living Will if son is able to bring it from safety deposit box PMT will continue to follow holistically   Goals of Care and Additional Recommendations: Limitations on Scope of Treatment: Full Scope Treatment  Code Status:    Code Status Orders  (From admission, onward)         Start     Ordered   11/24/19 1711  Do not attempt resuscitation (DNR)  Continuous       Question Answer Comment  In the event of cardiac or respiratory ARREST Do not call a "code blue"    In the event of cardiac or respiratory ARREST Do not perform Intubation, CPR, defibrillation or ACLS   In the event of cardiac or respiratory ARREST Use medication by any route, position, wound care, and other measures to relive pain and suffering. May use oxygen, suction and manual treatment of airway obstruction as needed for comfort.      11/24/19 1710        Code Status History    Date Active Date Inactive Code Status Order ID Comments User Context   11/24/2019 0001 11/24/2019 1710 Full Code [id number]  Gery Pray, MD ED   11/17/2019 2151 11/22/2019 2054 DNR [id number]  Gleason, Darcella Gasman, PA-C ED   11/17/2019 2151 11/17/2019 2151 Full Code [id number]  Gleason, Darcella Gasman, PA-C ED   09/01/2019 1048 09/02/2019 2222 DNR [id number]  Jonah Blue, MD ED   07/31/2019 0004 08/01/2019 0333 Full Code [id number]  Joselyn Arrow, MD ED   09/18/2018 1123 09/19/2018 1744 Full Code [id number]  Clydie Braun, MD ED   Advance Care Planning Activity      Prognosis:  < 6 months  Discharge Planning:  Likely Home with Palliative Services  Care plan was discussed with primary RN, Dr. Randol Kern, [facility], son  Thank you for allowing the Palliative Medicine Team to assist in the care of this patient.   Total Time 35 minutes Prolonged Time Billed  no       Greater than 50%  of this time was spent counseling and  coordinating care related to the above assessment and plan.  Haskel Khan, NP  Please contact Palliative Medicine Team phone at [phone] for questions and concerns.

## 2019-11-25 NOTE — Evaluation (Signed)
Occupational Therapy Evaluation Patient Details Name: Robin Moreno MRN: [id number] DOB: [birth year] Today's Date: 11/25/2019    History of Present Illness Pt is a 86 y/o female who was just discharged home on 8/26, presented to [facility] on 8/27 with reports of unresponsive, agonal breathing and with AMS. Pt intubated initially for airway protection, extubated within 24hrs. PMHx includes CVA, HTN, Covid-19, hx THA   Clinical Impression   This 86 y/o female presents with the above. Pt typically living at home with son who assists with self-care and mobility - pt mostly bedbound at baseline. This therapist is familiar with pt from most recent admit. Today pt appears more awake/alert than previous OT eval. She remains profoundly weak but does attempt to initiate assisting with bed mobility. Given her weakness she continues to require totalA+2 for bed mobility, up to maxA for static balance EOB and maxA for simple grooming ADL. Discussed with son and he currently prefers for pt to return home at time of discharge. Pt is appropriate for SNF level of care, but if son choosing to return home recommend follow up [facility] services, would likely benefit from [facility] aide. Will continue to follow while pt acutely admitted.     Follow Up Recommendations  Home health OT;Supervision/Assistance - 24 hour (pt is at level for SNF but son wishing for home )    Equipment Recommendations  Other (comment) (hoyer )           Precautions / Restrictions Precautions Precautions: Fall Restrictions Weight Bearing Restrictions: No      Mobility Bed Mobility Overal bed mobility: Needs Assistance Bed Mobility: Supine to Sit;Sit to Supine     Supine to sit: Total assist;+2 for physical assistance;+2 for safety/equipment Sit to supine: Total assist;+2 for physical assistance;+2 for safety/equipment   General bed mobility comments: pt does initiate attempts to assist, but not quite >25%, assist for LEs and for trunk elevation,  requires external assist to stabilize EOB   Transfers                 General transfer comment: deferred - recommend use of hoyer     Balance Overall balance assessment: Needs assistance Sitting-balance support: Feet supported Sitting balance-Leahy Scale: Zero Sitting balance - Comments: requires generally up to maxA for sitting balance                                   ADL either performed or assessed with clinical judgement   ADL Overall ADL's : Needs assistance/impaired Eating/Feeding: Maximal assistance;Sitting   Grooming: Maximal assistance;Sitting;Wash/dry face Grooming Details (indicate cue type and reason): assist to initiate self care task, using RUE to perform, seated EOB with external assist for balance                              Functional mobility during ADLs: Total assistance;+2 for physical assistance;+2 for safety/equipment (bed mobility ) General ADL Comments: pt max-totalA for remaining ADL     Vision         Perception     Praxis      Pertinent Vitals/Pain Pain Assessment: Faces Faces Pain Scale: No hurt Pain Intervention(s): Monitored during session     Hand Dominance Right   Extremity/Trunk Assessment Upper Extremity Assessment Upper Extremity Assessment: Generalized weakness;Difficult to assess due to impaired cognition;LUE deficits/detail LUE Deficits / Details: LUE seems grossly  weaker than RUE   Lower Extremity Assessment Lower Extremity Assessment: Defer to PT evaluation   Cervical / Trunk Assessment Cervical / Trunk Assessment: Kyphotic   Communication Communication Communication: HOH;Expressive difficulties   Cognition Arousal/Alertness: Awake/alert Behavior During Therapy: Flat affect Overall Cognitive Status: History of cognitive impairments - at baseline                                 General Comments: per chart pt likely with underlying dementia, pt HOH, intermittently  following simple commands given max multimodal cues    General Comments  son present and supportive during session, reports preference for pt to return home at time of discharge     Exercises     Shoulder Instructions      Home Living Family/patient expects to be discharged to:: Private residence Living Arrangements: Children Available Help at Discharge: Family Type of Home: House Home Access: Ramped entrance     Home Layout: One level     Bathroom Shower/Tub: Chief Strategy Officer: Handicapped height     Home Equipment: Environmental consultant - standard;Wheelchair - manual;Hospital bed   Additional Comments: home setup obtained from previous admission      Prior Functioning/Environment Level of Independence: Needs assistance  Gait / Transfers Assistance Needed:  pt appears to mostly be bedbound, son reports sometimes transfers to w/c, but suspect she hasn't in quite some time. He reports they had a hoyer at one point but he sent it back because he was never shown how to use it  ADL's / Homemaking Assistance Needed: assist for ADL            OT Problem List: Decreased strength;Decreased range of motion;Decreased activity tolerance;Impaired balance (sitting and/or standing);Decreased safety awareness;Decreased knowledge of use of DME or AE;Obesity;Impaired UE functional use;Decreased cognition      OT Treatment/Interventions: Self-care/ADL training;Therapeutic exercise;Energy conservation;DME and/or AE instruction;Therapeutic activities;Patient/family education;Balance training;Cognitive remediation/compensation    OT Goals(Current goals can be found in the care plan section) Acute Rehab OT Goals Patient Stated Goal: per son, to return home  OT Goal Formulation: With patient/family Time For Goal Achievement: 12/09/19 Potential to Achieve Goals: Fair  OT Frequency: Min 2X/week   Barriers to D/C:            Co-evaluation PT/OT/SLP Co-Evaluation/Treatment:  Yes Reason for Co-Treatment: Complexity of the patient's impairments (multi-system involvement);For patient/therapist safety;To address functional/ADL transfers   OT goals addressed during session: ADL's and self-care      AM-PAC OT "6 Clicks" Daily Activity     Outcome Measure Help from another person eating meals?: A Lot Help from another person taking care of personal grooming?: A Lot Help from another person toileting, which includes using toliet, bedpan, or urinal?: Total Help from another person bathing (including washing, rinsing, drying)?: Total Help from another person to put on and taking off regular upper body clothing?: Total Help from another person to put on and taking off regular lower body clothing?: Total 6 Click Score: 8   End of Session Nurse Communication: Mobility status;Need for lift equipment  Activity Tolerance: Patient tolerated treatment well Patient left: in chair;with call bell/phone within reach;with bed alarm set;with family/visitor present  OT Visit Diagnosis: Muscle weakness (generalized) (M62.81);Other abnormalities of gait and mobility (R26.89);Other symptoms and signs involving cognitive function                Time: 1250-1311 OT Time Calculation (min):  21 min Charges:  OT General Charges $OT Visit: 1 Visit OT Evaluation $OT Eval Moderate Complexity: 1 Mod  Robin Moreno, OT Acute Rehabilitation Services Pager [phone] Office [phone]  Orlando Penner 11/25/2019, 2:30 PM

## 2019-11-25 NOTE — TOC Progression Note (Signed)
Transition of Care [facility]) - Progression Note    Patient Details  Name: Robin Moreno MRN: [id number] Date of Birth: Sep 08, 1925  Transition of Care [facility]) CM/SW Contact  Deveron Furlong, RN 11/25/2019, 3:22 PM  Clinical Narrative:   Referral received for home hospice.  Spoke to son, [name], over the phone.  He was agreeable to home hospice until we discussed that home health services would stop.  He wants her to continue home PT.  When we ended the conversation, he agreed to OP Palliative and said he would keep [facility] services until she was in need of hospice, but he doesn't feel she is at that point.  Patient is active with [facility] PT.  We did conclude that [facility] would be the best option among the available hospice agencies.  He said he would consider residential hospice when the time came and would not want her to be in [facility] or another city.  She has a hospital bed from [employer], a WC, walker.  He states she cannot walk, but he doesn't want a hoyer lift.  His plan is that she will DC Tuesday and will need ambulance transport.    Palliative NP and Dr. Randol Kern updated and will continue to discuss options with [name] as they observe patient progress.  Wallis Bamberg, [facility] liaison, advised of OP Palliative referral.  They will f/u with [name] today or tomorrow also.      Expected Discharge Plan: Home w Home Health Services Barriers to Discharge: Continued Medical Work up  Expected Discharge Plan and Services Expected Discharge Plan: Home w Home Health Services     Post Acute Care Choice: Durable Medical Equipment, Home Health Living arrangements for the past 2 months: Single Family Home                                       Social Determinants of Health (SDOH) Interventions    Readmission Risk Interventions No flowsheet data found.

## 2019-11-25 NOTE — Progress Notes (Signed)
Bilateral Prevalon boots are on pt and bilateral heels are dry but skin is intact and no deep tissue injury is present on either heel. Pressure redistribution cushion is present in the room and the son can take it home for use if the pt gets up into a chair or wheelchair.

## 2019-11-25 NOTE — Progress Notes (Signed)
[facility])  Referral received for outpatient palliative care vs hospice.  Family wants to see if she can participate in rehab before making decision.   [facility] will follow while hospitalized.  Wallis Bamberg RN, BSN, CCRN [facility] Liaison

## 2019-11-25 NOTE — Evaluation (Signed)
Physical Therapy Evaluation Patient Details Name: Robin Moreno MRN: [id number] DOB: 1926-03-27 Today's Date: 11/25/2019   History of Present Illness  Pt is a 86 y/o female who was just discharged home on 8/26, presented to [facility] on 8/27 with reports of unresponsive, agonal breathing and with AMS. Pt intubated initially for airway protection, extubated within 24hrs. PMHx includes CVA, HTN, Covid-19, hx THA  Clinical Impression  Patient received in bed, alert, follows basic intsructions inconsistently. She requires max +2 assist for bed mobility at this time. Initiates some movement. Poor sitting balance requiring max assist, slumped posture. Patient will continue to benefit from skilled PT while here to improve strength and funcitonal mobility to reduce caregiver burden.         Follow Up Recommendations SNF;Supervision/Assistance - 24 hour    Equipment Recommendations  Other (comment) (possible hoyer)    Recommendations for Other Services       Precautions / Restrictions Precautions Precautions: Fall Restrictions Weight Bearing Restrictions: No      Mobility  Bed Mobility Overal bed mobility: Needs Assistance Bed Mobility: Supine to Sit;Sit to Supine;Rolling     Supine to sit: Max assist;+2 for physical assistance;+2 for safety/equipment Sit to supine: Max assist;+2 for physical assistance;+2 for safety/equipment   General bed mobility comments: pt does initiate attempts to assist, but not quite >25%, assist for LEs and for trunk elevation, requires external assist to stabilize EOB   Transfers                 General transfer comment: deferred - recommend use of hoyer   Ambulation/Gait             General Gait Details: not able  Stairs            Wheelchair Mobility    Modified Rankin (Stroke Patients Only)       Balance Overall balance assessment: Needs assistance Sitting-balance support: Feet supported Sitting balance-Leahy Scale:  Poor Sitting balance - Comments: requires generally up to maxA for sitting balance. Sits with rounded back, forward head Postural control: Posterior lean;Right lateral lean                                   Pertinent Vitals/Pain Pain Assessment: Faces Faces Pain Scale: No hurt Pain Intervention(s): Monitored during session    Home Living Family/patient expects to be discharged to:: Private residence Living Arrangements: Children Available Help at Discharge: Family;Available 24 hours/day Type of Home: House Home Access: Ramped entrance     Home Layout: One level Home Equipment: Walker - standard;Wheelchair - manual;Hospital bed Additional Comments: home setup obtained from previous admission    Prior Function Level of Independence: Needs assistance   Gait / Transfers Assistance Needed:  pt appears to mostly be bedbound, son reports sometimes transfers to w/c, but suspect she hasn't in quite some time. He reports they had a hoyer at one point but he sent it back because he was never shown how to use it   ADL's / Homemaking Assistance Needed: assist for ADL  Comments: per previous admits, pt hasn't walked in quite some time      Hand Dominance   Dominant Hand: Right    Extremity/Trunk Assessment   Upper Extremity Assessment Upper Extremity Assessment: Defer to OT evaluation LUE Deficits / Details: LUE seems grossly weaker than RUE    Lower Extremity Assessment Lower Extremity Assessment: Generalized weakness;RLE deficits/detail;LLE deficits/detail RLE  Deficits / Details: difficult to asses due to cognition, required assist to bring legs on/off bed LLE Deficits / Details: difficult to asses due to cognition, required assist to bring legs on/off bed    Cervical / Trunk Assessment Cervical / Trunk Assessment: Kyphotic  Communication   Communication: HOH;Expressive difficulties  Cognition Arousal/Alertness: Awake/alert Behavior During Therapy: Flat  affect Overall Cognitive Status: History of cognitive impairments - at baseline                                 General Comments: per chart pt likely with underlying dementia, pt HOH, intermittently following simple commands given max multimodal cues       General Comments General comments (skin integrity, edema, etc.): son present and supportive during session, reports preference for pt to return home at time of discharge     Exercises     Assessment/Plan    PT Assessment Patient needs continued PT services  PT Problem List Decreased strength;Decreased activity tolerance;Decreased balance;Decreased mobility;Decreased coordination;Decreased knowledge of use of DME;Cardiopulmonary status limiting activity;Decreased cognition;Decreased safety awareness       PT Treatment Interventions DME instruction;Therapeutic activities;Functional mobility training;Balance training;Therapeutic exercise;Patient/family education    PT Goals (Current goals can be found in the Care Plan section)  Acute Rehab PT Goals Patient Stated Goal: per son, to return home  PT Goal Formulation: Patient unable to participate in goal setting Time For Goal Achievement: 12/03/19 Potential to Achieve Goals: Fair    Frequency Min 2X/week   Barriers to discharge        Co-evaluation PT/OT/SLP Co-Evaluation/Treatment: Yes Reason for Co-Treatment: For patient/therapist safety;To address functional/ADL transfers;Complexity of the patient's impairments (multi-system involvement) PT goals addressed during session: Mobility/safety with mobility;Balance OT goals addressed during session: ADL's and self-care       AM-PAC PT "6 Clicks" Mobility  Outcome Measure Help needed turning from your back to your side while in a flat bed without using bedrails?: Total Help needed moving from lying on your back to sitting on the side of a flat bed without using bedrails?: Total Help needed moving to and from a bed  to a chair (including a wheelchair)?: Total Help needed standing up from a chair using your arms (e.g., wheelchair or bedside chair)?: Total Help needed to walk in hospital room?: Total Help needed climbing 3-5 steps with a railing? : Total 6 Click Score: 6    End of Session   Activity Tolerance: Patient limited by lethargy Patient left: in bed;with call bell/phone within reach;with bed alarm set;with family/visitor present Nurse Communication: Mobility status;Need for lift equipment PT Visit Diagnosis: Other abnormalities of gait and mobility (R26.89);Muscle weakness (generalized) (M62.81);Other symptoms and signs involving the nervous system (R29.898);Adult, failure to thrive (R62.7)    Time: 6160-7371 PT Time Calculation (min) (ACUTE ONLY): 23 min   Charges:   PT Evaluation $PT Eval Moderate Complexity: 1 Mod          Ignatz Deis, PT, GCS 11/25/19,3:18 PM

## 2019-11-26 NOTE — Discharge Summary (Addendum)
Physician Discharge Summary  Robin Moreno QJJ:[id number] DOB: Nov 29, 1925 DOA: 11/23/2019  PCP: Jilda Panda, MD  Admit date: 11/23/2019 Discharge date: 11/26/2019  Admitted From: Home Disposition:  Home with outpatient palliative care   Recommendations for Outpatient Follow-up:  Follow up with PCP in 1 week  CODE STATUS: DNR   Diet recommendation:  Diet Orders (From admission, onward)    Start     Ordered   11/25/19 1433  DIET - DYS 1 Room service appropriate? No; Fluid consistency: Nectar Thick  Diet effective now       Question Answer Comment  Room service appropriate? No   Fluid consistency: Nectar Thick      11/25/19 1433          Brief/Interim Summary: Robin Moreno is a 86 year old female who was just discharged on 8/26. On 8/21 she was found in an unresponsive state at home with agonal breathing. She wasintubation for airway protection, extubated within 24 hours by PCCM.She was also found tohave COVID infection. Chest x-ray was negative. She was clear and oriented at the time ofdischarge but thought to have some underlying mild dementia. Per son, patient was goodwhen dischargedbut he was unable to wake her up on morning of 8/27. He let her sleep but when he was still unable to [facility] hedecided to take her to the ER. Per ER physician he was told by the son that despite being unable to fully awakenhis motherhe did give her home meds along with some cough medications.  She was admitted for acute toxic metabolic encephalopathy secondary to unintentional polypharmacy.  New events last 24 hours / Subjective: She is sitting in bed, eating breakfast with nurse tech at bedside.  She has no new complaints, remains somewhat confused and a poor historian overall.  Discharge Diagnoses:  Active Problems:   COVID-19   Essential hypertension   Dyslipidemia   Altered mental status   Pulmonary embolism on right (HCC)   Somnolence   Failure to thrive in adult    Palliative care by specialist   Goals of care, counseling/discussion   Advanced directives, counseling/discussion   DNI (do not intubate)   Concern about end of life   Encounter for hospice care discussion    Acute metabolic encephalopathy -With baseline dementia with fluctuating mental status -CT head: No acute intracranial abnormality, allowing for motion artifact on the current exam -Improved  Failure to thrive -Appreciate palliative care medicine.  Plan for discharge home with home health and outpatient palliative care.  Son has declined home hospice as he would like to continue home health physical therapy on discharge  Recent COVID-19 -Does not require a COVID-19 isolation  PE -Continue Eliquis  A. fib -Continue Eliquis, Lopressor  Hyperlipidemia -Continue Crestor     In agreement with assessment of the pressure ulcer as below:  Pressure Injury 11/18/19 Sacrum Left;Right Stage 2 -  Partial thickness loss of dermis presenting as a shallow open injury with a red, pink wound bed without slough. (Active)  11/18/19 0102  Location: Sacrum  Location Orientation: Left;Right  Staging: Stage 2 -  Partial thickness loss of dermis presenting as a shallow open injury with a red, pink wound bed without slough.  Wound Description (Comments):   Present on Admission: Yes       Discharge Instructions  Discharge Instructions    Call MD for:  difficulty breathing, headache or visual disturbances   Complete by: As directed    Call MD for:  extreme fatigue   Complete by: As directed    Call MD for:  persistant dizziness or light-headedness   Complete by: As directed    Call MD for:  persistant nausea and vomiting   Complete by: As directed    Call MD for:  severe uncontrolled pain   Complete by: As directed    Call MD for:  temperature >100.4   Complete by: As directed    Discharge instructions   Complete by: As directed    You were cared for by a hospitalist during your  hospital stay. If you have any questions about your discharge medications or the care you received while you were in the hospital after you are discharged, you can call the unit and ask to speak with the hospitalist on call if the hospitalist that took care of you is not available. Once you are discharged, your primary care physician will handle any further medical issues. Please note that NO REFILLS for any discharge medications will be authorized once you are discharged, as it is imperative that you return to your primary care physician (or establish a relationship with a primary care physician if you do not have one) for your aftercare needs so that they can reassess your need for medications and monitor your lab values.   Increase activity slowly   Complete by: As directed    No wound care   Complete by: As directed      Allergies as of 11/26/2019   No Known Allergies     Medication List    TAKE these medications   acetaminophen 325 MG tablet Commonly known as: Tylenol Take 2 tablets (650 mg total) by mouth every 6 (six) hours as needed. What changed: reasons to take this   albuterol 108 (90 Base) MCG/ACT inhaler Commonly known as: VENTOLIN HFA Inhale 1-2 puffs into the lungs every 4 (four) hours as needed for wheezing or shortness of breath.   apixaban 5 MG Tabs tablet Commonly known as: ELIQUIS Please take 10 mg oral twice daily for next 5 days, then transition to 5 mg oral twice daily from 11/28/2019   baclofen 10 MG tablet Commonly known as: LIORESAL Take 10 mg by mouth 2 (two) times daily.   fluticasone 50 MCG/ACT nasal spray Commonly known as: FLONASE Place 1 spray into both nostrils daily as needed for allergies or rhinitis.   gabapentin 100 MG capsule Commonly known as: Neurontin Take 1 capsule (100 mg total) by mouth 3 (three) times daily.   meclizine 12.5 MG tablet Commonly known as: ANTIVERT Take 1 tablet (12.5 mg total) by mouth 3 (three) times daily as needed  for dizziness.   metoprolol tartrate 25 MG tablet Commonly known as: LOPRESSOR Take 1 tablet (25 mg total) by mouth 2 (two) times daily.   pantoprazole 40 MG tablet Commonly known as: PROTONIX Take 1 tablet (40 mg total) by mouth daily.   rosuvastatin 10 MG tablet Commonly known as: CRESTOR Take 10 mg by mouth daily.   SYSTANE OP Place 1 drop into both eyes every morning.   VITAMIN C PO Take 1 tablet by mouth daily.   Vitamin D 50 MCG (2000 UT) tablet Take 2,000 Units by mouth daily.   ZINC PO Take 1 tablet by mouth daily.       Follow-up Information    [facility] Follow up.   Why: Home Palliative Services  Contact information: [facility].   Specialty: Home Health Services Contact information: [place] [address] [phone]        Jilda Panda, MD. Schedule an appointment as soon as possible for a visit in 1 week(s).   Specialty: Internal Medicine Contact information: [id number] [place] [address] [phone]              No Known Allergies  Consultations:  Palliative care   Procedures/Studies: CT Head Wo Contrast  Result Date: 11/23/2019 CLINICAL DATA:  Altered mental status. Progressive altered mental status for 2 days. Recent hospital discharge after fall. EXAM: CT HEAD WITHOUT CONTRAST TECHNIQUE: Contiguous axial images were obtained from the base of the skull through the vertex without intravenous contrast. COMPARISON:  Head CT 11/17/2019, brain MRI 10/29/2019 FINDINGS: Brain: Mild motion artifact limitations on the current exam. Stable degree of atrophy and chronic small vessel ischemia. Again seen remote left frontoparietal infarct. No intracranial hemorrhage, mass effect, or midline shift. No hydrocephalus. The basilar cisterns are patent. No evidence of territorial infarct or acute ischemia. No extra-axial or intracranial  fluid collection. Vascular: Atherosclerosis of skullbase vasculature without hyperdense vessel or abnormal calcification. Skull: No fracture or focal lesion. Sinuses/Orbits: Paranasal sinuses and mastoid air cells are clear. The visualized orbits are unremarkable. Bilateral cataract resection. Other: None. IMPRESSION: 1. No acute intracranial abnormality, allowing for motion artifact on the current exam. 2. Stable atrophy, chronic small vessel ischemia, and remote left frontoparietal infarct. Electronically Signed   By: Keith Rake M.D.   On: 11/23/2019 19:03   CT HEAD WO CONTRAST  Result Date: 11/17/2019 CLINICAL DATA:  Altered level of consciousness, trauma, unresponsive, found down EXAM: CT HEAD WITHOUT CONTRAST CT CERVICAL SPINE WITHOUT CONTRAST TECHNIQUE: Multidetector CT imaging of the head and cervical spine was performed following the standard protocol without intravenous contrast. Multiplanar CT image reconstructions of the cervical spine were also generated. COMPARISON:  08/31/2019 FINDINGS: CT HEAD FINDINGS Brain: Hypodensity left parietal cortex consistent with chronic infarct, stable. No signs of acute infarct or hemorrhage. Lateral ventricles and midline structures are unremarkable. No acute extra-axial fluid collections. No mass effect. Vascular: No hyperdense vessel or unexpected calcification. Skull: Normal. Negative for fracture or focal lesion. Sinuses/Orbits: No acute finding. Other: None. CT CERVICAL SPINE FINDINGS Alignment: Alignment is anatomic. Skull base and vertebrae: No acute displaced fracture. Soft tissues and spinal canal: Patient is intubated, endotracheal tube at level of thoracic inlet. Enteric catheter is identified, distal margin excluded by slice selection. Prevertebral soft tissues are unremarkable. No visible canal hematoma. Disc levels: Stable diffuse facet hypertrophy. Mild spondylosis at C3-4 and C6-7. There is mild diffuse neural foraminal encroachment related to  facet hypertrophic change. Findings are stable. Upper chest: Lung apices are clear. Other: Reconstructed images demonstrate no additional findings. IMPRESSION: 1. No acute intracranial process. 2. Stable chronic left parietal infarct. 3. No acute cervical spine fracture. Electronically Signed   By: Randa Ngo M.D.   On: 11/17/2019 18:36   CT ANGIO CHEST PE W OR WO CONTRAST  Result Date: 11/20/2019 CLINICAL DATA:  85 year old female with shortness of breath. Positive COVID-19. Concern for pulmonary embolism. EXAM: CT ANGIOGRAPHY CHEST WITH CONTRAST TECHNIQUE: Multidetector CT imaging of the chest was performed using the standard protocol during bolus administration of intravenous contrast. Multiplanar CT image reconstructions and MIPs were obtained to evaluate the vascular anatomy. CONTRAST:  35m OMNIPAQUE IOHEXOL 350 MG/ML SOLN COMPARISON:  Chest CT dated 11/17/2019. FINDINGS: Evaluation is very limited  due to respiratory motion artifact and streak artifact caused by patient's arms. Cardiovascular: There is no cardiomegaly or pericardial effusion. Three-vessel coronary vascular calcification. There is moderate atherosclerotic calcification of the thoracic aorta. Evaluation of the pulmonary arteries is limited due to respiratory motion artifact. There is a band like density in the right upper lobe pulmonary artery branch (94/6) which may be artifactual and represent a branch point or a nonocclusive thrombus. Additional apparent filling defects in the subsegmental branches of the right lower lobe (1710/6) may represent pulmonary emboli. Evaluation is very limited due to severe motion artifact. V/Q scan may provide better evaluation if clinically indicated. No large central pulmonary artery embolus identified. Mediastinum/Nodes: There is no hilar or mediastinal adenopathy. The esophagus is grossly unremarkable. No mediastinal fluid collection. Lungs/Pleura: Small bilateral pleural effusions. There is an area  of consolidative change involving the right lower lobe similar or slightly progressed since the prior CT which may represent atelectasis or pneumonia. Linear left lung base atelectasis noted. No pneumothorax. The central airways are patent. Upper Abdomen: No acute abnormality. Musculoskeletal: Osteopenia with degenerative changes of the spine. No acute osseous pathology. Old anterior rib fractures. Review of the MIP images confirms the above findings. IMPRESSION: 1. Very limited study due to respiratory motion artifact and streak artifact caused by patient's arms. 2. Probable small right upper lobe and right lower lobe subsegmental pulmonary emboli. No large central pulmonary artery embolus identified. V/Q scan may provide better evaluation if clinically indicated. 3. Small bilateral pleural effusions with an area of consolidative change involving the right lower lobe similar or slightly progressed since the prior CT which may represent atelectasis or pneumonia. 4. Aortic Atherosclerosis (ICD10-I70.0). These results were called by telephone at the time of interpretation on 11/20/2019 at 7:51 pm to Dr. Myna Hidalgo, who verbally acknowledged these results. Electronically Signed   By: Anner Crete M.D.   On: 11/20/2019 20:08   CT Angio Chest PE W and/or Wo Contrast  Result Date: 11/17/2019 CLINICAL DATA:  Unresponsive, axonal breathing. EXAM: CT ANGIOGRAPHY CHEST WITH CONTRAST TECHNIQUE: Multidetector CT imaging of the chest was performed using the standard protocol during bolus administration of intravenous contrast. Multiplanar CT image reconstructions and MIPs were obtained to evaluate the vascular anatomy. CONTRAST:  35m OMNIPAQUE IOHEXOL 350 MG/ML SOLN COMPARISON:  Chest x-ray same date., remote chest CT from 2005 FINDINGS: Cardiovascular: Calcified atheromatous plaque in the thoracic aorta. Calcifications of the aortic valve. Mitral annular calcifications. No aneurysmal dilation of the thoracic aorta. Limited  assessment of the aorta due to bolus timing. Contour is smooth. No pericardial effusion. Heart size is normal. Central pulmonary vascular caliber is normal. Study quality is good. No pulmonary embolism. Mediastinum/Nodes: Endotracheal tube in place terminating just at the thoracic inlet. Balloon inflation just below the larynx. This was previously communicated. Gastric tube passes through the esophagus into the stomach. No adenopathy at the thoracic inlet. No mediastinal adenopathy. No hilar adenopathy. Lungs/Pleura: Basilar airspace disease. No filling defects or fluid in bronchi. No pneumothorax. No lobar consolidation. No pleural effusion. Upper Abdomen: Gallbladder is distended, moderately with gallstones in the dependent portion. No pericholecystic stranding. No peripancreatic stranding. Adrenal glands are normal. No acute process in the incompletely imaged upper abdomen. Musculoskeletal: No acute bone process. No chest wall lesion. No destructive bone finding. Glenohumeral and spinal degenerative changes. Review of the MIP images confirms the above findings. IMPRESSION: 1. Negative for pulmonary embolism. 2. Basilar airspace disease, likely atelectasis. 3. Endotracheal tube in place terminating just at the  thoracic inlet. Balloon inflation just below the larynx. This was previously communicated as outlined on previous chest radiograph. 4. Cholelithiasis without evidence of acute cholecystitis. 5. Aortic atherosclerosis. Aortic Atherosclerosis (ICD10-I70.0). Electronically Signed   By: Zetta Bills M.D.   On: 11/17/2019 18:46   CT Cervical Spine Wo Contrast  Result Date: 11/17/2019 CLINICAL DATA:  Altered level of consciousness, trauma, unresponsive, found down EXAM: CT HEAD WITHOUT CONTRAST CT CERVICAL SPINE WITHOUT CONTRAST TECHNIQUE: Multidetector CT imaging of the head and cervical spine was performed following the standard protocol without intravenous contrast. Multiplanar CT image reconstructions of  the cervical spine were also generated. COMPARISON:  08/31/2019 FINDINGS: CT HEAD FINDINGS Brain: Hypodensity left parietal cortex consistent with chronic infarct, stable. No signs of acute infarct or hemorrhage. Lateral ventricles and midline structures are unremarkable. No acute extra-axial fluid collections. No mass effect. Vascular: No hyperdense vessel or unexpected calcification. Skull: Normal. Negative for fracture or focal lesion. Sinuses/Orbits: No acute finding. Other: None. CT CERVICAL SPINE FINDINGS Alignment: Alignment is anatomic. Skull base and vertebrae: No acute displaced fracture. Soft tissues and spinal canal: Patient is intubated, endotracheal tube at level of thoracic inlet. Enteric catheter is identified, distal margin excluded by slice selection. Prevertebral soft tissues are unremarkable. No visible canal hematoma. Disc levels: Stable diffuse facet hypertrophy. Mild spondylosis at C3-4 and C6-7. There is mild diffuse neural foraminal encroachment related to facet hypertrophic change. Findings are stable. Upper chest: Lung apices are clear. Other: Reconstructed images demonstrate no additional findings. IMPRESSION: 1. No acute intracranial process. 2. Stable chronic left parietal infarct. 3. No acute cervical spine fracture. Electronically Signed   By: Randa Ngo M.D.   On: 11/17/2019 18:36   MR BRAIN WO CONTRAST  Result Date: 11/18/2019 CLINICAL DATA:  Altered mental status with unknown cause. Chronic kidney disease EXAM: MRI HEAD WITHOUT CONTRAST TECHNIQUE: Multiplanar, multiecho pulse sequences of the brain and surrounding structures were obtained without intravenous contrast. COMPARISON:  Head CT from yesterday FINDINGS: Brain: No acute infarction, hemorrhage, hydrocephalus, extra-axial collection or mass lesion. Remote left frontal parietal infarct with hemosiderin deposition, small. Chronic small vessel ischemia in the cerebral white matter and pons, moderately extensive.  Prominent cortical spinal radiations on diffusion imaging attributed to anisotropy rather than pathology. Age normal brain volume. Vascular: Preserved flow voids Skull and upper cervical spine: No focal marrow lesion. Degenerative facet spurring with C3-4 facet ankylosis. Sinuses/Orbits: Bilateral cataract resection IMPRESSION: 1. No emergent or reversible finding. 2. Senescent changes and small remote left frontal parietal cortex infarct. Electronically Signed   By: Monte Fantasia M.D.   On: 11/18/2019 07:26   DG Chest Port 1 View  Result Date: 11/23/2019 CLINICAL DATA:  Altered mental status. EXAM: PORTABLE CHEST 1 VIEW COMPARISON:  November 17, 2019 FINDINGS: The endotracheal tube and nasogastric tube seen on the prior study have been removed. Multiple overlying cardiac lead wires are seen. Mild, diffuse chronic appearing increased interstitial lung markings are noted. There is no evidence of acute infiltrate, pleural effusion or pneumothorax. The heart size and mediastinal contours are within normal limits. Degenerative changes seen throughout the thoracic spine. IMPRESSION: Chronic appearing increased interstitial lung markings without evidence of acute or active cardiopulmonary disease. Electronically Signed   By: Virgina Norfolk M.D.   On: 11/23/2019 17:20   DG Chest Port 1 View  Result Date: 11/17/2019 CLINICAL DATA:  Tube adjustment. COVID positive patient, found unresponsive EXAM: PORTABLE CHEST 1 VIEW COMPARISON:  Chest x-ray November 17, 2019 FINDINGS: Interval advancement of  the endotracheal tube now approximately 5 cm above the carina. Gastric tube in place with tip in the stomach in the upper abdomen. Cardiomediastinal contours are stable. Lungs are clear. Visualized skeletal structures on limited assessment are unremarkable. IMPRESSION: No acute cardiopulmonary disease with interval advancement of endotracheal tube now approximately 5 cm above the carina. Electronically Signed   By: Zetta Bills M.D.   On: 11/17/2019 19:30   DG Chest Port 1 View  Result Date: 11/17/2019 CLINICAL DATA:  Respiratory failure EXAM: PORTABLE CHEST 1 VIEW COMPARISON:  11/16/2019 FINDINGS: Endotracheal tube approximately 6.8 cm above the carina. Tip is above the level of the clavicular heads. Gastric tube in the stomach. Cardiomediastinal contours are unchanged, likely normal accounting for low depth of expansion and projection. Lungs are clear. No acute skeletal process on limited assessment. IMPRESSION: 1. No acute cardiopulmonary process on limited assessment. 2. Endotracheal tube, tip above the clavicular heads approximately 7 cm from the carina. Consider slight advancement approximately 2 cm with repeat radiography to document placement. 3. These results were called by telephone at the time of interpretation on 11/17/2019 at 6:22 pm to provider [facility] , who verbally acknowledged these results. Electronically Signed   By: Zetta Bills M.D.   On: 11/17/2019 18:22   DG Chest Port 1 View  Result Date: 11/16/2019 CLINICAL DATA:  Bilateral lower extremity spasms for 1 week. EXAM: PORTABLE CHEST 1 VIEW COMPARISON:  Single-view of the chest 09/01/2019. FINDINGS: Lungs are clear. Heart size is normal. No pneumothorax or pleural effusion. Aortic atherosclerosis. No acute or focal bony abnormality. IMPRESSION: No acute disease. Aortic Atherosclerosis (ICD10-I70.0). Electronically Signed   By: Inge Rise M.D.   On: 11/16/2019 16:32   VAS [place] LOWER EXTREMITY VENOUS (DVT)  Result Date: 11/22/2019  Lower Venous DVTStudy Indications: Rapidly rising ddimer.  Comparison Study: no prior Performing Technologist: Abram Sander RVS  Examination Guidelines: A complete evaluation includes B-mode imaging, spectral Doppler, color Doppler, and power Doppler as needed of all accessible portions of each vessel. Bilateral testing is considered an integral part of a complete examination. Limited examinations for reoccurring  indications may be performed as noted. The reflux portion of the exam is performed with the patient in reverse Trendelenburg.  +---------+---------------+---------+-----------+----------+--------------+ RIGHT    CompressibilityPhasicitySpontaneityPropertiesThrombus Aging +---------+---------------+---------+-----------+----------+--------------+ CFV      Full           Yes      Yes                                 +---------+---------------+---------+-----------+----------+--------------+ SFJ      Full                                                        +---------+---------------+---------+-----------+----------+--------------+ FV Prox  Full                                                        +---------+---------------+---------+-----------+----------+--------------+ FV Mid   Full                                                        +---------+---------------+---------+-----------+----------+--------------+  FV DistalFull                                                        +---------+---------------+---------+-----------+----------+--------------+ PFV      Full                                                        +---------+---------------+---------+-----------+----------+--------------+ POP      Full           Yes      Yes                                 +---------+---------------+---------+-----------+----------+--------------+ PTV      Full                                                        +---------+---------------+---------+-----------+----------+--------------+ PERO                                                  Not visualized +---------+---------------+---------+-----------+----------+--------------+   +---------+---------------+---------+-----------+----------+--------------+ LEFT     CompressibilityPhasicitySpontaneityPropertiesThrombus Aging  +---------+---------------+---------+-----------+----------+--------------+ CFV      Full           Yes      Yes                                 +---------+---------------+---------+-----------+----------+--------------+ SFJ      Full                                                        +---------+---------------+---------+-----------+----------+--------------+ FV Prox  Full                                                        +---------+---------------+---------+-----------+----------+--------------+ FV Mid   Full                                                        +---------+---------------+---------+-----------+----------+--------------+ FV DistalFull                                                        +---------+---------------+---------+-----------+----------+--------------+  PFV      Full                                                        +---------+---------------+---------+-----------+----------+--------------+ POP      Full           Yes      Yes                                 +---------+---------------+---------+-----------+----------+--------------+ PTV      Full                                                        +---------+---------------+---------+-----------+----------+--------------+ PERO     Full                                                        +---------+---------------+---------+-----------+----------+--------------+     Summary: BILATERAL: - No evidence of deep vein thrombosis seen in the lower extremities, bilaterally. - No evidence of superficial venous thrombosis in the lower extremities, bilaterally. -   *See table(s) above for measurements and observations. Electronically signed by Monica Martinez MD on 11/22/2019 at 3:12:12 PM.    Final    ECHOCARDIOGRAM LIMITED  Result Date: 11/21/2019    ECHOCARDIOGRAM LIMITED REPORT   Patient Name:   Robin Moreno Date of Exam: 11/21/2019 Medical Rec #:  [id number]      Height:       66.0 in Accession #:    [id number]    Weight:       187.4 lb Date of Birth:  02/14/1926      BSA:          1.945 m Patient Age:    46 years      BP:           144/69 mmHg Patient Gender: F             HR:           77 bpm. Exam Location:  Inpatient Procedure: Limited Echo, Limited Color Doppler and Cardiac Doppler Indications:    CHF  History:        Patient has prior history of Echocardiogram examinations, most                 recent 10/10/2012. Covid Positive; Risk Factors:Hypertension.  Sonographer:    Mikki Santee RDCS (AE) Referring Phys: [id number] Margaree Mackintosh [facility]  Sonographer Comments: Image acquisition challenging due to uncooperative patient. IMPRESSIONS  1. There is moderate asymmetric hypertrophy of the basal septum up to 1.5 cm. There is a gradient across the LVOT up to 27 mmHG. No clear SAM of the MV was observed, but this study was low quality due to patient not wanting to complete the study. No provocable maneuvers performed due to lack of cooperation. There was central MR, and the MV was noted to have severe MAC. 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 Would consider further characterization with Cardiac MR.  Left ventricular ejection fraction, by estimation, is 55 to 60%. The left ventricle has normal function. The left ventricle has no regional wall motion abnormalities. There is moderate asymmetric left ventricular hypertrophy of the basal-septal segment.  Left ventricular diastolic function could not be evaluated.  2. Right ventricular systolic function is normal. The right ventricular size is normal.  3. Left atrial size was mildly dilated.  4. The mitral valve is degenerative. Mild mitral valve regurgitation. Mild mitral stenosis. The mean mitral valve gradient is 3.0 mmHg with average heart rate of 77 bpm.  5. The aortic valve was not well visualized. Aortic valve regurgitation is not visualized. Mild aortic valve stenosis. Aortic valve area,  by VTI measures 1.76 cm. Aortic valve mean gradient measures 11.5 mmHg. Aortic valve Vmax measures 2.29 m/s. FINDINGS  Left Ventricle: There is moderate asymmetric hypertrophy of the basal septum up to 1.5 cm. There is a gradient across the LVOT up to 27 mmHG. No clear SAM of the MV was observed, but this study was low quality due to patient not wanting to complete the study. No provocable maneuvers performed due to lack of cooperation. There was central MR, and the MV was noted to have severe MAC. 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 Would consider further characterization with Cardiac MR. Left ventricular ejection fraction, by estimation, is 55 to 60%. The left ventricle has normal function. The left ventricle has no regional wall motion abnormalities. The left ventricular internal cavity size was small. There is moderate asymmetric left ventricular hypertrophy of the basal-septal segment. Left ventricular diastolic function could not be evaluated due to mitral annular calcification (moderate or greater). Right Ventricle: The right ventricular size is normal. No increase in right ventricular wall thickness. Right ventricular systolic function is normal. Left Atrium: Left atrial size was mildly dilated. Right Atrium: Right atrial size was normal in size. Pericardium: There is no evidence of pericardial effusion. Mitral Valve: The mitral valve is degenerative in appearance. Severe mitral annular calcification. Mild mitral valve regurgitation. Mild mitral valve stenosis. The mean mitral valve gradient is 3.0 mmHg with average heart rate of 77 bpm. Tricuspid Valve: The tricuspid valve is grossly normal. Tricuspid valve regurgitation is mild . No evidence of tricuspid stenosis. Aortic Valve: The aortic valve was not well visualized. Aortic valve regurgitation is not visualized. Mild aortic stenosis is present. Aortic valve mean gradient measures 11.5 mmHg. Aortic valve peak  gradient measures 21.0 mmHg. Aortic valve area, by VTI  measures 1.76 cm. Pulmonic Valve: The pulmonic valve was not well visualized. Venous: The inferior vena cava was not well visualized. LEFT VENTRICLE PLAX 2D LVIDd:         2.40 cm  Diastology LVIDs:         1.80 cm  LV e' lateral:   6.66 cm/s LV PW:         0.80 cm  LV E/e' lateral: 12.2 LV IVS:        1.50 cm  LV e' medial:    5.34 cm/s LVOT diam:     1.70 cm  LV E/e' medial:  15.2 LV SV:         73 LV SV Index:   37 LVOT Area:     2.27 cm  LEFT ATRIUM           Index       RIGHT ATRIUM  Index LA diam:      3.20 cm 1.65 cm/m  RA Area:     9.98 cm LA Vol (A4C): 66.4 ml 34.14 ml/m RA Volume:   18.10 ml 9.30 ml/m  AORTIC VALVE AV Area (Vmax):    1.66 cm AV Area (Vmean):   1.58 cm AV Area (VTI):     1.76 cm AV Vmax:           229.00 cm/s AV Vmean:          157.000 cm/s AV VTI:            0.413 m AV Peak Grad:      21.0 mmHg AV Mean Grad:      11.5 mmHg LVOT Vmax:         167.00 cm/s LVOT Vmean:        109.000 cm/s LVOT VTI:          0.321 m LVOT/AV VTI ratio: 0.78  AORTA Ao Root diam: 2.40 cm MITRAL VALVE                TRICUSPID VALVE MV Area (PHT): 1.75 cm     TR Peak grad:   28.1 mmHg MV Area VTI:   2.28 cm     TR Vmax:        265.00 cm/s MV Mean grad:  3.0 mmHg MV VTI:        0.32 m       SHUNTS MV Decel Time: 433 msec     Systemic VTI:  0.32 m MV E velocity: 81.40 cm/s   Systemic Diam: 1.70 cm MV A velocity: 106.00 cm/s MV E/A ratio:  0.77 Eleonore Chiquito MD Electronically signed by Eleonore Chiquito MD Signature Date/Time: 11/21/2019/4:39:49 PM    Final        Discharge Exam: Vitals:   11/25/19 2032 11/26/19 0537  BP: (!) 162/71 (!) 167/83  Pulse: 78 71  Resp: 17 17  Temp: (!) 97.4 F (36.3 C) (!) 97.5 F (36.4 C)  SpO2:  99%    General: Pt is alert, awake, not in acute distress Cardiovascular: S1/S2 +, no edema Respiratory: CTA bilaterally, no wheezing, no rhonchi, no respiratory distress, no conversational dyspnea   Abdominal: Soft, NT, ND, bowel sounds + Extremities: no edema, no cyanosis Psych: +Dementia   The results of significant diagnostics from this hospitalization (including imaging, microbiology, ancillary and laboratory) are listed below for reference.     Microbiology: Recent Results (from the past 240 hour(s))  SARS Coronavirus 2 by RT PCR (hospital order, performed in [facility] hospital lab) Nasopharyngeal Nasopharyngeal Swab     Status: Abnormal   Collection Time: 11/16/19  5:02 PM   Specimen: Nasopharyngeal Swab  Result Value Ref Range Status   SARS Coronavirus 2 POSITIVE (A) NEGATIVE Final    Comment: RESULT CALLED TO, READ BACK BY AND VERIFIED WITH: L MEEKS RN 11/16/19 AT 1907 SK (NOTE) SARS-CoV-2 target nucleic acids are DETECTED  SARS-CoV-2 RNA is generally detectable in upper respiratory specimens  during the acute phase of infection.  Positive results are indicative  of the presence of the identified virus, but do not rule out bacterial infection or co-infection with other pathogens not detected by the test.  Clinical correlation with patient history and  other diagnostic information is necessary to determine patient infection status.  The expected result is negative.  Fact Sheet for Patients:   StrictlyIdeas.no   Fact Sheet for Healthcare Providers:   BankingDealers.co.za  This test is not yet approved or cleared by the [place] and  has been authorized for detection and/or diagnosis of SARS-CoV-2 by FDA under an Emergency Use Authorization (EUA).  This EUA will remain in effect (meaning this test ca n be used) for the duration of  the COVID-19 declaration under Section 564(b)(1) of the Act, 21 U.S.C. section 360-bbb-3(b)(1), unless the authorization is terminated or revoked sooner.  Performed at [facility], [place] [address] [place], [place] [address]   MRSA PCR Screening     Status: None    Collection Time: 11/18/19  1:01 AM   Specimen: Urine, Catheterized; Nasopharyngeal  Result Value Ref Range Status   MRSA by PCR NEGATIVE NEGATIVE Final    Comment:        The GeneXpert MRSA Assay (FDA approved for NASAL specimens only), is one component of a comprehensive MRSA colonization surveillance program. It is not intended to diagnose MRSA infection nor to guide or monitor treatment for MRSA infections. Performed at [facility], [place] [address], [place], [place] [address]   Culture, blood (routine x 2)     Status: None   Collection Time: 11/18/19 10:29 AM   Specimen: BLOOD  Result Value Ref Range Status   Specimen Description BLOOD RIGHT ANTECUBITAL  Final   Special Requests   Final    BOTTLES DRAWN AEROBIC AND ANAEROBIC Blood Culture results may not be optimal due to an inadequate volume of blood received in culture bottles   Culture   Final    NO GROWTH 5 DAYS Performed at [facility], [place] [address] [place], [place] [address]    Report Status 11/23/2019 FINAL  Final  Culture, blood (routine x 2)     Status: None   Collection Time: 11/18/19 10:29 AM   Specimen: BLOOD RIGHT HAND  Result Value Ref Range Status   Specimen Description BLOOD RIGHT HAND  Final   Special Requests AEROBIC BOTTLE ONLY Blood Culture adequate volume  Final   Culture   Final    NO GROWTH 5 DAYS Performed at [facility], [place] [address] [place], [place] [address]    Report Status 11/23/2019 FINAL  Final     Labs: BNP (last 3 results) Recent Labs    07/30/19 2255 11/21/19 0726 11/22/19 0214  BNP 30.9 343.3* 449.6*   Basic Metabolic Panel: Recent Labs  Lab 11/21/19 0726 11/22/19 0214 11/23/19 1830 11/24/19 0916 11/25/19 0252  NA 141 139 141 139 136  K 3.1* 4.1 3.6 3.5 3.5  CL 113* 111 111 107 106  CO2 16* 18* 20* 19* 21*  GLUCOSE 126* 119* 105* 105* 93  BUN 8 8 7* 7* 6*  CREATININE 0.70 0.70 0.84 0.78 0.73  CALCIUM 8.9 8.9 9.3 9.3 8.9  MG 1.6* 2.0  --    --   --    Liver Function Tests: Recent Labs  Lab 11/21/19 0726 11/22/19 0214 11/23/19 1830  AST 39 36 37  ALT _0 ALKPHOS 72 70 72  BILITOT 0.8 0.3 0.8  PROT 6.4* 5.9* 6.2*  ALBUMIN 2.6* 2.5* 2.7*   No results for input(s): LIPASE, AMYLASE in the last 168 hours. No results for input(s): AMMONIA in the last 168 hours. CBC: Recent Labs  Lab 11/21/19 0726 11/22/19 0214 11/23/19 1830 11/24/19 0841 11/25/19 0252  WBC 7.7 6.3 5.8 7.6 6.7  NEUTROABS 4.9 3.7 3.0  --   --   HGB 10.2*  10.0* 10.3* 10.7* 9.9*  HCT 31.5* 30.8* 33.9* 34.6* 31.3*  MCV 90.0 93.1 95.5 93.0 91.3  PLT 262 234 304 322 334   Cardiac Enzymes: No results for input(s): CKTOTAL, CKMB, CKMBINDEX, TROPONINI in the last 168 hours. BNP: Invalid input(s): POCBNP CBG: Recent Labs  Lab 11/19/19 2306  GLUCAP 106*   D-Dimer No results for input(s): DDIMER in the last 72 hours. Hgb A1c No results for input(s): HGBA1C in the last 72 hours. Lipid Profile No results for input(s): CHOL, HDL, LDLCALC, TRIG, CHOLHDL, LDLDIRECT in the last 72 hours. Thyroid function studies No results for input(s): TSH, T4TOTAL, T3FREE, THYROIDAB in the last 72 hours.  Invalid input(s): FREET3 Anemia work up No results for input(s): VITAMINB12, FOLATE, FERRITIN, TIBC, IRON, RETICCTPCT in the last 72 hours. Urinalysis    Component Value Date/Time   COLORURINE YELLOW 11/23/2019 1830   APPEARANCEUR CLEAR 11/23/2019 1830   LABSPEC 1.011 11/23/2019 1830   PHURINE 5.0 11/23/2019 1830   GLUCOSEU NEGATIVE 11/23/2019 1830   HGBUR NEGATIVE 11/23/2019 1830   BILIRUBINUR NEGATIVE 11/23/2019 1830   KETONESUR NEGATIVE 11/23/2019 1830   PROTEINUR NEGATIVE 11/23/2019 1830   NITRITE NEGATIVE 11/23/2019 1830   LEUKOCYTESUR NEGATIVE 11/23/2019 1830   Sepsis Labs Invalid input(s): PROCALCITONIN,  WBC,  LACTICIDVEN Microbiology Recent Results (from the past 240 hour(s))  SARS Coronavirus 2 by RT PCR (hospital order, performed in  [place] hospital lab) Nasopharyngeal Nasopharyngeal Swab     Status: Abnormal   Collection Time: 11/16/19  5:02 PM   Specimen: Nasopharyngeal Swab  Result Value Ref Range Status   SARS Coronavirus 2 POSITIVE (A) NEGATIVE Final    Comment: RESULT CALLED TO, READ BACK BY AND VERIFIED WITH: L MEEKS RN 11/16/19 AT 1907 SK (NOTE) SARS-CoV-2 target nucleic acids are DETECTED  SARS-CoV-2 RNA is generally detectable in upper respiratory specimens  during the acute phase of infection.  Positive results are indicative  of the presence of the identified virus, but do not rule out bacterial infection or co-infection with other pathogens not detected by the test.  Clinical correlation with patient history and  other diagnostic information is necessary to determine patient infection status.  The expected result is negative.  Fact Sheet for Patients:   StrictlyIdeas.no   Fact Sheet for Healthcare Providers:   BankingDealers.co.za    This test is not yet approved or cleared by the [place] FDA and  has been authorized for detection and/or diagnosis of SARS-CoV-2 by FDA under an Emergency Use Authorization (EUA).  This EUA will remain in effect (meaning this test ca n be used) for the duration of  the COVID-19 declaration under Section 564(b)(1) of the Act, 21 U.S.C. section 360-bbb-3(b)(1), unless the authorization is terminated or revoked sooner.  Performed at [facility], [place] [address], [place], [place] [address]   MRSA PCR Screening     Status: None   Collection Time: 11/18/19  1:01 AM   Specimen: Urine, Catheterized; Nasopharyngeal  Result Value Ref Range Status   MRSA by PCR NEGATIVE NEGATIVE Final    Comment:        The GeneXpert MRSA Assay (FDA approved for NASAL specimens only), is one component of a comprehensive MRSA colonization surveillance program. It is not intended to diagnose MRSA infection nor to guide  or monitor treatment for MRSA infections. Performed at [facility], [place] [address] [place], [place] [address]   Culture, blood (routine x 2)     Status: None  Collection Time: 11/18/19 10:29 AM   Specimen: BLOOD  Result Value Ref Range Status   Specimen Description BLOOD RIGHT ANTECUBITAL  Final   Special Requests   Final    BOTTLES DRAWN AEROBIC AND ANAEROBIC Blood Culture results may not be optimal due to an inadequate volume of blood received in culture bottles   Culture   Final    NO GROWTH 5 DAYS Performed at [facility], [place] [address] [place], [place] [address]    Report Status 11/23/2019 FINAL  Final  Culture, blood (routine x 2)     Status: None   Collection Time: 11/18/19 10:29 AM   Specimen: BLOOD RIGHT HAND  Result Value Ref Range Status   Specimen Description BLOOD RIGHT HAND  Final   Special Requests AEROBIC BOTTLE ONLY Blood Culture adequate volume  Final   Culture   Final    NO GROWTH 5 DAYS Performed at [facility], [place] [address] [place], [place] [address]    Report Status 11/23/2019 FINAL  Final     Patient was seen and examined on the day of discharge and was found to be in stable condition. Time coordinating discharge: 40 minutes including assessment and coordination of care, as well as examination of the patient.   SIGNED:  Dessa Phi, DO [facility] 11/26/2019, 1:24 PM

## 2019-11-26 NOTE — Care Management (Addendum)
Patient active with [facility] prior to admission for [facility] and [facility]PT. Resumption of care orders entered. Cassie with [facility] aware.   PAtient for discharge today. [facility] aware. Spoke to son Carlean Crowl [phone]. He is aware and requesting ambulance transport home. NCM confirmed address. Bedside nurse requesting 3 pm pick up time. Daryl aware 3 pm is an estimated time.   [facility] papers left in chart and [facility] called. Ronny Flurry RN

## 2019-11-26 NOTE — Progress Notes (Signed)
Pt discharged home with her son. Transported by ptar

## 2019-11-27 ENCOUNTER — Telehealth: Payer: Self-pay | Admitting: Hospice

## 2019-11-27 NOTE — Telephone Encounter (Signed)
Phone call placed to patient to offer to schedule a visit with [facility]. Phone rang, with no answer I left a voicemail for call back. 

## 2019-12-08 ENCOUNTER — Emergency Department (HOSPITAL_COMMUNITY)

## 2019-12-08 ENCOUNTER — Emergency Department (HOSPITAL_COMMUNITY)
Admission: EM | Admit: 2019-12-08 | Discharge: 2019-12-08 | Disposition: A | Discharge status: Home / Self Care | Attending: Emergency Medicine | Admitting: Emergency Medicine

## 2019-12-08 DIAGNOSIS — N1831 Chronic kidney disease, stage 3a: Secondary | ICD-10-CM | POA: Insufficient documentation

## 2019-12-08 DIAGNOSIS — I129 Hypertensive chronic kidney disease with stage 1 through stage 4 chronic kidney disease, or unspecified chronic kidney disease: Secondary | ICD-10-CM | POA: Diagnosis not present

## 2019-12-08 DIAGNOSIS — Z7901 Long term (current) use of anticoagulants: Secondary | ICD-10-CM | POA: Insufficient documentation

## 2019-12-08 DIAGNOSIS — Z8616 Personal history of COVID-19: Secondary | ICD-10-CM | POA: Diagnosis not present

## 2019-12-08 DIAGNOSIS — R4182 Altered mental status, unspecified: Secondary | ICD-10-CM | POA: Insufficient documentation

## 2019-12-08 DIAGNOSIS — R5383 Other fatigue: Secondary | ICD-10-CM | POA: Diagnosis not present

## 2019-12-08 DIAGNOSIS — Z79899 Other long term (current) drug therapy: Secondary | ICD-10-CM | POA: Diagnosis not present

## 2019-12-08 LAB — I-STAT VENOUS BLOOD GAS, ED
Acid-Base Excess: 1 mmol/L (ref 0.0–2.0)
Bicarbonate: 24.5 mmol/L (ref 20.0–28.0)
Calcium, Ion: 1.02 mmol/L — ABNORMAL LOW (ref 1.15–1.40)
HCT: 38 % (ref 36.0–46.0)
Hemoglobin: 12.9 g/dL (ref 12.0–15.0)
O2 Saturation: 100 %
Potassium: 4.7 mmol/L (ref 3.5–5.1)
Sodium: 144 mmol/L (ref 135–145)
TCO2: 26 mmol/L (ref 22–32)
pCO2, Ven: 35.7 mmHg — ABNORMAL LOW (ref 44.0–60.0)
pH, Ven: 7.445 — ABNORMAL HIGH (ref 7.250–7.430)
pO2, Ven: 217 mmHg — ABNORMAL HIGH (ref 32.0–45.0)

## 2019-12-08 LAB — URINALYSIS, ROUTINE W REFLEX MICROSCOPIC
Bilirubin Urine: NEGATIVE
Glucose, UA: NEGATIVE mg/dL
Hgb urine dipstick: NEGATIVE
Ketones, ur: NEGATIVE mg/dL
Leukocytes,Ua: NEGATIVE
Nitrite: NEGATIVE
Protein, ur: NEGATIVE mg/dL
Specific Gravity, Urine: 1.016 (ref 1.005–1.030)
pH: 5 (ref 5.0–8.0)

## 2019-12-08 LAB — ETHANOL: Alcohol, Ethyl (B): <10 mg/dL (ref <10)

## 2019-12-08 LAB — COMPREHENSIVE METABOLIC PANEL WITH GFR
ALT: 18 U/L (ref 0–44)
AST: 31 U/L (ref 15–41)
Albumin: 3.1 g/dL — ABNORMAL LOW (ref 3.5–5.0)
Alkaline Phosphatase: 103 U/L (ref 38–126)
Anion gap: 9 (ref 5–15)
BUN: 12 mg/dL (ref 8–23)
CO2: 27 mmol/L (ref 22–32)
Calcium: 9.3 mg/dL (ref 8.9–10.3)
Chloride: 109 mmol/L (ref 98–111)
Creatinine, Ser: 1.13 mg/dL — ABNORMAL HIGH (ref 0.44–1.00)
GFR calc Af Amer: 49 mL/min — ABNORMAL LOW (ref >60)
GFR calc non Af Amer: 42 mL/min — ABNORMAL LOW (ref >60)
Glucose, Bld: 112 mg/dL — ABNORMAL HIGH (ref 70–99)
Potassium: 4.3 mmol/L (ref 3.5–5.1)
Sodium: 145 mmol/L (ref 135–145)
Total Bilirubin: 0.7 mg/dL (ref 0.3–1.2)
Total Protein: 7.1 g/dL (ref 6.5–8.1)

## 2019-12-08 LAB — CBC WITH DIFFERENTIAL/PLATELET
Abs Immature Granulocytes: 0.03 K/uL (ref 0.00–0.07)
Basophils Absolute: 0 K/uL (ref 0.0–0.1)
Basophils Relative: 1 %
Eosinophils Absolute: 0.2 K/uL (ref 0.0–0.5)
Eosinophils Relative: 6 %
HCT: 39.6 % (ref 36.0–46.0)
Hemoglobin: 11.6 g/dL — ABNORMAL LOW (ref 12.0–15.0)
Immature Granulocytes: 1 %
Lymphocytes Relative: 52 %
Lymphs Abs: 2 K/uL (ref 0.7–4.0)
MCH: 29.1 pg (ref 26.0–34.0)
MCHC: 29.3 g/dL — ABNORMAL LOW (ref 30.0–36.0)
MCV: 99.5 fL (ref 80.0–100.0)
Monocytes Absolute: 0.4 K/uL (ref 0.1–1.0)
Monocytes Relative: 9 %
Neutro Abs: 1.2 K/uL — ABNORMAL LOW (ref 1.7–7.7)
Neutrophils Relative %: 31 %
Platelets: 353 K/uL (ref 150–400)
RBC: 3.98 MIL/uL (ref 3.87–5.11)
RDW: 14.6 % (ref 11.5–15.5)
WBC: 3.9 K/uL — ABNORMAL LOW (ref 4.0–10.5)
nRBC: 0 % (ref 0.0–0.2)

## 2019-12-08 LAB — AMMONIA: Ammonia: 26 umol/L (ref 9–35)

## 2019-12-08 LAB — CBG MONITORING, ED: Glucose-Capillary: 85 mg/dL (ref 70–99)

## 2019-12-08 NOTE — Discharge Instructions (Signed)
We did not find a laboratory or imaging cause of your mother's sleepiness. This certainly could be caused by the muscle relaxants that you have just started giving her. I would have you hold that medication and call her family doctor on Monday and discuss your visit to the ED here and see if they want to make any changes to that medication regimen. Please return for any concerning symptoms or if you would like her to be reassessed.

## 2019-12-08 NOTE — ED Notes (Signed)
Pt son at bedside, states he is her primary caregiver. Son explains that pt was talking and at her baseline Thursday, she has PT at the house that day. On Friday he noticed she was more lethargic and she slept all day. Today he was having trouble waking her up this morning to change her and she wouldn't respond at all.

## 2019-12-08 NOTE — ED Notes (Signed)
[facility] has been called for transportation home

## 2019-12-08 NOTE — ED Provider Notes (Signed)
[facility] EMERGENCY DEPARTMENT Provider Note   CSN: [id number] Arrival date & time: 12/08/19  0741     History Chief Complaint  Patient presents with  . Altered Mental Status    Robin Moreno is a 86 y.o. female.  86 yo F with a chief complaints of increased sleepiness. They woke up the patient this morning and give her a bath and she was less responsive than normal. Normally will respond with some speech but they felt she was less responsive. Was just in the hospital for similar presentation required intubation. Has been seen with palliative care and there were talks about going into hospice which the family is currently declining. They deny any change in her medications recently. Level 5 caveat altered mental status.  The history is provided by the patient.  Altered Mental Status Presenting symptoms: lethargy   Severity:  Moderate Most recent episode:  Today Episode history:  Single Duration:  2 hours Timing:  Constant Progression:  Unchanged Chronicity:  Recurrent Context: dementia   Associated symptoms: no fever, no headaches, no nausea, no palpitations and no vomiting        Past Medical History:  Diagnosis Date  . Arthritis   . CVA (cerebral vascular accident) (HCC)   . Degenerative disk disease   . Hypercholesterolemia   . Hypertension   . Osteoarthritis of right knee   . Renal disorder   . Stage 3a chronic kidney disease 09/01/2019    Patient Active Problem List   Diagnosis Date Noted  . Failure to thrive in adult 11/24/2019  . Palliative care by specialist   . Goals of care, counseling/discussion   . Advanced directives, counseling/discussion   . DNI (do not intubate)   . Concern about end of life   . Encounter for hospice care discussion   . Somnolence 11/23/2019  . Pulmonary embolism on right (HCC) 11/20/2019  . Pressure injury of skin 11/18/2019  . Respiratory distress 11/17/2019  . Altered mental status   . Respiratory arrest  (HCC)   . Facial twitching 09/01/2019  . Stage 3a chronic kidney disease 09/01/2019  . DNR (do not resuscitate) 09/01/2019  . COVID-19 07/30/2019  . Essential hypertension 07/30/2019  . Dyslipidemia 07/30/2019  . Pre-syncope 07/30/2019  . Viral gastroenteritis due to [place] agent 09/19/2018  . Acute kidney injury superimposed on CKD (HCC) 09/18/2018  . Diarrhea 09/18/2018  . Urinary tract infection 09/18/2018  . Generalized weakness 09/18/2018  . Arthritis 09/18/2018    Past Surgical History:  Procedure Laterality Date  . ABDOMINAL HYSTERECTOMY    . TOTAL HIP ARTHROPLASTY       OB History   No obstetric history on file.     No family history on file.  Social History   Tobacco Use  . Smoking status: Never Smoker  . Smokeless tobacco: Never Used  Vaping Use  . Vaping Use: Never used  Substance Use Topics  . Alcohol use: No  . Drug use: No    Home Medications Prior to Admission medications   Medication Sig Start Date End Date Taking? Authorizing Provider  acetaminophen (TYLENOL) 325 MG tablet Take 2 tablets (650 mg total) by mouth every 6 (six) hours as needed. Patient taking differently: Take 650 mg by mouth every 6 (six) hours as needed for mild pain or headache.  01/04/19  Yes Nanavati, Ankit, MD  albuterol (VENTOLIN HFA) 108 (90 Base) MCG/ACT inhaler Inhale 1-2 puffs into the lungs every 4 (four) hours as needed  for wheezing or shortness of breath.    Yes [provider]  apixaban (ELIQUIS) 5 MG TABS tablet Please take 10 mg oral twice daily for next 5 days, then transition to 5 mg oral twice daily from 11/28/2019 11/22/19  Yes Elgergawy, Leana Roe, MD  Ascorbic Acid (VITAMIN C PO) Take 1 tablet by mouth daily.   Yes [provider]  baclofen (LIORESAL) 10 MG tablet Take 10 mg by mouth 2 (two) times daily. 11/16/19  Yes [provider]  Cholecalciferol (VITAMIN D) 2000 UNITS tablet Take 2,000 Units by mouth daily.     Yes [provider]  fluticasone (FLONASE) 50 MCG/ACT nasal spray Place 1 spray into both nostrils daily as needed for allergies or rhinitis.  11/04/19  Yes [provider]  gabapentin (NEURONTIN) 100 MG capsule Take 1 capsule (100 mg total) by mouth 3 (three) times daily. 09/02/19  Yes Burnadette Pop, MD  meclizine (ANTIVERT) 12.5 MG tablet Take 1 tablet (12.5 mg total) by mouth 3 (three) times daily as needed for dizziness. 07/23/18  Yes Pricilla Loveless, MD  metoprolol tartrate (LOPRESSOR) 25 MG tablet Take 1 tablet (25 mg total) by mouth 2 (two) times daily. 11/22/19  Yes Elgergawy, Leana Roe, MD  Multiple Vitamins-Minerals (ZINC PO) Take 1 tablet by mouth daily.   Yes [provider]  pantoprazole (PROTONIX) 40 MG tablet Take 1 tablet (40 mg total) by mouth daily. 11/23/19  Yes Elgergawy, Leana Roe, MD  Polyethyl Glycol-Propyl Glycol (SYSTANE OP) Place 1 drop into both eyes every morning.   Yes [provider]  rosuvastatin (CRESTOR) 10 MG tablet Take 10 mg by mouth daily. 09/19/19  Yes [provider]    Allergies    Patient has no known allergies.  Review of Systems   Review of Systems  Constitutional: Positive for appetite change and fatigue. Negative for chills and fever.  HENT: Negative for congestion and rhinorrhea.   Eyes: Negative for redness and visual disturbance.  Respiratory: Negative for shortness of breath and wheezing.   Cardiovascular: Negative for chest pain and palpitations.  Gastrointestinal: Negative for nausea and vomiting.  Genitourinary: Negative for dysuria and urgency.  Musculoskeletal: Negative for arthralgias and myalgias.  Skin: Negative for pallor and wound.  Neurological: Negative for dizziness and headaches.    Physical Exam Updated Vital Signs BP 130/63   Pulse (!) 57   Temp (!) 97.4 F (36.3 C) (Rectal)   Resp 16   SpO2 100%   Physical Exam Vitals and nursing note reviewed.  Constitutional:      General: She is not in acute  distress.    Appearance: She is well-developed. She is not diaphoretic.  HENT:     Head: Normocephalic and atraumatic.  Eyes:     Pupils: Pupils are equal, round, and reactive to light.  Cardiovascular:     Rate and Rhythm: Normal rate and regular rhythm.     Heart sounds: No murmur heard.  No friction rub. No gallop.   Pulmonary:     Effort: Pulmonary effort is normal.     Breath sounds: No wheezing or rales.  Abdominal:     General: There is no distension.     Palpations: Abdomen is soft.     Tenderness: There is no abdominal tenderness.  Musculoskeletal:        General: No tenderness.     Cervical back: Normal range of motion and neck supple.  Skin:    General: Skin is warm  and dry.  Neurological:     Mental Status: She is alert and oriented to person, place, and time.     GCS: GCS eye subscore is 2. GCS verbal subscore is 2. GCS motor subscore is 5.     Comments: Resists with LUE, moves actively with RUE.   Psychiatric:        Behavior: Behavior normal.     ED Results / Procedures / Treatments   Labs (all labs ordered are listed, but only abnormal results are displayed) Labs Reviewed  COMPREHENSIVE METABOLIC PANEL - Abnormal; Notable for the following components:      Result Value   Glucose, Bld 112 (*)    Creatinine, Ser 1.13 (*)    Albumin 3.1 (*)    GFR calc non Af Amer 42 (*)    GFR calc Af Amer 49 (*)    All other components within normal limits  CBC WITH DIFFERENTIAL/PLATELET - Abnormal; Notable for the following components:   WBC 3.9 (*)    Hemoglobin 11.6 (*)    MCHC 29.3 (*)    Neutro Abs 1.2 (*)    All other components within normal limits  I-STAT VENOUS BLOOD GAS, ED - Abnormal; Notable for the following components:   pH, Ven 7.445 (*)    pCO2, Ven 35.7 (*)    pO2, Ven 217.0 (*)    Calcium, Ion 1.02 (*)    All other components within normal limits  URINE CULTURE  AMMONIA  ETHANOL  URINALYSIS, ROUTINE W REFLEX MICROSCOPIC  CBG MONITORING, ED     EKG None  Radiology CT HEAD WO CONTRAST  Result Date: 12/08/2019 CLINICAL DATA:  Pt arrived by [facility] from home. Per family when they went to change her this morning pt was more lethargic and not verbally responding as she would at baseline. Per family pt has facial drop at baseline from possible stroke last yearDelirium EXAM: CT HEAD WITHOUT CONTRAST TECHNIQUE: Contiguous axial images were obtained from the base of the skull through the vertex without intravenous contrast. COMPARISON:  Head CT 11/23/2019 FINDINGS: Brain: No acute intracranial hemorrhage. No focal mass lesion. No CT evidence of acute infarction. No midline shift or mass effect. No hydrocephalus. Basilar cisterns are patent. There are periventricular and subcortical white matter hypodensities. Generalized cortical atrophy. Vascular: No hyperdense vessel or unexpected calcification. Skull: Normal. Negative for fracture or focal lesion. Sinuses/Orbits: Paranasal sinuses and mastoid air cells are clear. Orbits are clear. Other: None. IMPRESSION: No acute intracranial findings. Electronically Signed   By: Genevive Bi M.D.   On: 12/08/2019 09:51   DG Chest Port 1 View  Result Date: 12/08/2019 CLINICAL DATA:  Altered mental status EXAM: PORTABLE CHEST 1 VIEW COMPARISON:  11/23/2019 FINDINGS: Patient rotated. Normal cardiac silhouette with ectatic aorta. No effusion, infiltrate or pneumothorax. IMPRESSION: No acute cardiopulmonary process. Electronically Signed   By: Genevive Bi M.D.   On: 12/08/2019 08:37    Procedures Procedures (including critical care time)  Medications Ordered in ED Medications - No data to display  ED Course  I have reviewed the triage vital signs and the nursing notes.  Pertinent labs & imaging results that were available during my care of the patient were reviewed by me and considered in my medical decision making (see chart for details).    MDM Rules/Calculators/A&P                           86 yo F  with a cc of ams.  Had a similar visit recently and was thought due to polypharmacy.  Patient able to wake to pain, unsure of baseline, will obtain AMS workup.  Discuss with family.   Work-up is returned and is largely unremarkable the patient is not hypercarbic she has no significant electrolyte abnormality her urine is negative for infection chest x-ray viewed by me without focal infiltrate. I discussed the results with the family. They asked if muscle relaxant will be to start giving her this past week may be the cause. She is more alert now. Will wake up to a cold rag on her forehead. We will have her follow-up with her PCP.  10:35 AM:  I have discussed the diagnosis/risks/treatment options with the patient and believe the pt to be eligible for discharge home to follow-up with PCP. We also discussed returning to the ED immediately if new or worsening sx occur. We discussed the sx which are most concerning (e.g., sudden worsening pain, fever, inability to tolerate by mouth) that necessitate immediate return. Medications administered to the patient during their visit and any new prescriptions provided to the patient are listed below.  Medications given during this visit Medications - No data to display   The patient appears reasonably screen and/or stabilized for discharge and I doubt any other medical condition or other [facility] requiring further screening, evaluation, or treatment in the ED at this time prior to discharge.   Final Clinical Impression(s) / ED Diagnoses Final diagnoses:  Altered mental status, unspecified altered mental status type    Rx / DC Orders ED Discharge Orders    None       Melene Plan, DO 12/08/19 1035

## 2019-12-08 NOTE — ED Triage Notes (Addendum)
Pt arrived by [facility] from home. Per family when they went to change her this morning pt was more lethargic and not verbally responding as she would at baseline. Per family pt has facial drop at baseline from possible stroke last year.   Per medics BG 131 and vital signs stable   Pt not verbally responsive, responds to pain with opening her eyes

## 2019-12-09 LAB — URINE CULTURE: Culture: <10000 — AB

## 2019-12-30 NOTE — Progress Notes (Deleted)
Cardiology Office Note:    Date:  12/30/2019   ID:  Robin Moreno, DOB [birth year], MRN [id number]  PCP:  Ralene Ok, MD  Cardiologist:  No primary care provider on file.  Electrophysiologist:  None   Referring MD: Ralene Ok, MD   No chief complaint on file. ***  History of Present Illness:    Robin Moreno is a 86 y.o. female with a hx of CKD, CVA, HTN, chronic immobility, COVID-19 infection May 2020 requiring admission.  She was admitted to [facility] from 8/21 through 11/22/2019 after being found unresponsive with agonal breathing, required intubation.  He was found to be positive for COVID-19.  CT head and MRI brain were unremarkable, she was extubated within 24 hours.  CTA chest was positive for PE, she was started on Eliquis.  She is 13 new diagnosis paroxysmal atrial fibrillation, which she was started on Eliquis and metoprolol.  Echocardiogram on 11/21/2019 showed asymmetric hypertrophy of the basal septum measuring up to 1.5 cm, LVOT gradient 27 mmHg, LVEF 55 to 60%, normal RV function, mild MR/MS, mild AS.  Past Medical History:  Diagnosis Date  . Arthritis   . CVA (cerebral vascular accident) (HCC)   . Degenerative disk disease   . Hypercholesterolemia   . Hypertension   . Osteoarthritis of right knee   . Renal disorder   . Stage 3a chronic kidney disease 09/01/2019    Past Surgical History:  Procedure Laterality Date  . ABDOMINAL HYSTERECTOMY    . TOTAL HIP ARTHROPLASTY      Current Medications: No outpatient medications have been marked as taking for the 12/31/19 encounter (Appointment) with Little Ishikawa, MD.     Allergies:   Patient has no known allergies.   Social History   Socioeconomic History  . Marital status: Widowed    Spouse name: Not on file  . Number of children: Not on file  . Years of education: Not on file  . Highest education level: Not on file  Occupational History  . Not on file  Tobacco Use  . Smoking status: Never Smoker  .  Smokeless tobacco: Never Used  Vaping Use  . Vaping Use: Never used  Substance and Sexual Activity  . Alcohol use: No  . Drug use: No  . Sexual activity: Not Currently  Other Topics Concern  . Not on file  Social History Narrative  . Not on file   Social Determinants of Health   Financial Resource Strain:   . Difficulty of Paying Living Expenses: Not on file  Food Insecurity:   . Worried About Programme researcher, broadcasting/film/video in the Last Year: Not on file  . Ran Out of Food in the Last Year: Not on file  Transportation Needs:   . Lack of Transportation (Medical): Not on file  . Lack of Transportation (Non-Medical): Not on file  Physical Activity:   . Days of Exercise per Week: Not on file  . Minutes of Exercise per Session: Not on file  Stress:   . Feeling of Stress : Not on file  Social Connections:   . Frequency of Communication with Friends and Family: Not on file  . Frequency of Social Gatherings with Friends and Family: Not on file  . Attends Religious Services: Not on file  . Active Member of Clubs or Organizations: Not on file  . Attends Banker Meetings: Not on file  . Marital Status: Not on file     Family History: The patient's ***  family history is not on file.  ROS:   Please see the history of present illness.    *** All other systems reviewed and are negative.  EKGs/Labs/Other Studies Reviewed:    The following studies were reviewed today: ***  EKG:  EKG is *** ordered today.  The ekg ordered today demonstrates ***  Recent Labs: 11/21/2019: TSH 2.173 11/22/2019: B Natriuretic Peptide 175.9; Magnesium 2.0 12/08/2019: ALT 18; BUN 12; Creatinine, Ser 1.13; Hemoglobin 12.9; Platelets 353; Potassium 4.7; Sodium 144  Recent Lipid Panel No results found for: CHOL, TRIG, HDL, CHOLHDL, VLDL, LDLCALC, LDLDIRECT  Physical Exam:    VS:  There were no vitals taken for this visit.    Wt Readings from Last 3 Encounters:  11/24/19 119 lb 0.8 oz (54 kg)    11/22/19 185 lb 6.5 oz (84.1 kg)  09/01/19 154 lb 5.2 oz (70 kg)     GEN: *** Well nourished, well developed in no acute distress HEENT: Normal NECK: No JVD; No carotid bruits LYMPHATICS: No lymphadenopathy CARDIAC: ***RRR, no murmurs, rubs, gallops RESPIRATORY:  Clear to auscultation without rales, wheezing or rhonchi  ABDOMEN: Soft, non-tender, non-distended MUSCULOSKELETAL:  No edema; No deformity  SKIN: Warm and dry NEUROLOGIC:  Alert and oriented x 3 PSYCHIATRIC:  Normal affect   ASSESSMENT:    No diagnosis found. PLAN:     Atrial fibrillation: Paroxysmal, diagnosed during admission in August 2021.  CHA2DS2-VASc score 6 (hypertension, age x2, CVA x2, female) -Continue Eliquis 5 mg BID (meets criteria for 2.5 mg dose, but on 5 mg BID given recent PE) -Continue metoprolol 25 mg BID  LVH: Echocardiogram concerning for hokum, with 27 mmHg LVOT gradient.  Will evaluate further with cardiac MRI.  Mild AS/MR/MS: monitor with repeat echo in 2 years  PE: Noted on CTPA in August 2021.  On Eliquis as above.  Hyperlipidemia: On rosuvastatin 10 mg daily  Hypertension: On metoprolol 25 mg twice daily  RTC in***    Medication Adjustments/Labs and Tests Ordered: Current medicines are reviewed at length with the patient today.  Concerns regarding medicines are outlined above.  No orders of the defined types were placed in this encounter.  No orders of the defined types were placed in this encounter.   There are no Patient Instructions on file for this visit.   Signed, Little Ishikawa, MD  12/30/2019 2:50 PM    [facility]

## 2019-12-31 ENCOUNTER — Ambulatory Visit: Payer: Medicare Other | Admitting: Cardiology

## 2020-01-02 ENCOUNTER — Emergency Department (HOSPITAL_COMMUNITY)

## 2020-01-02 ENCOUNTER — Other Ambulatory Visit: Payer: Self-pay

## 2020-01-02 ENCOUNTER — Encounter (HOSPITAL_COMMUNITY): Payer: Self-pay | Admitting: Emergency Medicine

## 2020-01-02 ENCOUNTER — Emergency Department (HOSPITAL_COMMUNITY)
Admission: EM | Admit: 2020-01-02 | Discharge: 2020-01-03 | Disposition: A | Discharge status: Home / Self Care | Attending: Emergency Medicine | Admitting: Emergency Medicine

## 2020-01-02 DIAGNOSIS — N1831 Chronic kidney disease, stage 3a: Secondary | ICD-10-CM | POA: Diagnosis not present

## 2020-01-02 DIAGNOSIS — E860 Dehydration: Secondary | ICD-10-CM | POA: Insufficient documentation

## 2020-01-02 DIAGNOSIS — Z79899 Other long term (current) drug therapy: Secondary | ICD-10-CM | POA: Diagnosis not present

## 2020-01-02 DIAGNOSIS — I129 Hypertensive chronic kidney disease with stage 1 through stage 4 chronic kidney disease, or unspecified chronic kidney disease: Secondary | ICD-10-CM | POA: Diagnosis not present

## 2020-01-02 DIAGNOSIS — Z8616 Personal history of COVID-19: Secondary | ICD-10-CM | POA: Insufficient documentation

## 2020-01-02 DIAGNOSIS — Z7901 Long term (current) use of anticoagulants: Secondary | ICD-10-CM | POA: Diagnosis not present

## 2020-01-02 DIAGNOSIS — R55 Syncope and collapse: Secondary | ICD-10-CM | POA: Diagnosis present

## 2020-01-02 LAB — CBC WITH DIFFERENTIAL/PLATELET
Abs Immature Granulocytes: 0.08 10*3/uL — ABNORMAL HIGH (ref 0.00–0.07)
Basophils Absolute: 0.1 10*3/uL (ref 0.0–0.1)
Basophils Relative: 1 %
Eosinophils Absolute: 0.1 10*3/uL (ref 0.0–0.5)
Eosinophils Relative: 1 %
HCT: 39.4 % (ref 36.0–46.0)
Hemoglobin: 12.3 g/dL (ref 12.0–15.0)
Immature Granulocytes: 1 %
Lymphocytes Relative: 22 %
Lymphs Abs: 1.8 10*3/uL (ref 0.7–4.0)
MCH: 29.5 pg (ref 26.0–34.0)
MCHC: 31.2 g/dL (ref 30.0–36.0)
MCV: 94.5 fL (ref 80.0–100.0)
Monocytes Absolute: 0.5 10*3/uL (ref 0.1–1.0)
Monocytes Relative: 6 %
Neutro Abs: 5.6 10*3/uL (ref 1.7–7.7)
Neutrophils Relative %: 69 %
Platelets: 240 10*3/uL (ref 150–400)
RBC: 4.17 MIL/uL (ref 3.87–5.11)
RDW: 14.6 % (ref 11.5–15.5)
WBC: 8.1 10*3/uL (ref 4.0–10.5)
nRBC: 0 % (ref 0.0–0.2)

## 2020-01-02 LAB — COMPREHENSIVE METABOLIC PANEL
ALT: 14 U/L (ref 0–44)
AST: 24 U/L (ref 15–41)
Albumin: 3 g/dL — ABNORMAL LOW (ref 3.5–5.0)
Alkaline Phosphatase: 111 U/L (ref 38–126)
Anion gap: 10 (ref 5–15)
BUN: 15 mg/dL (ref 8–23)
CO2: 22 mmol/L (ref 22–32)
Calcium: 9.6 mg/dL (ref 8.9–10.3)
Chloride: 109 mmol/L (ref 98–111)
Creatinine, Ser: 0.87 mg/dL (ref 0.44–1.00)
GFR calc non Af Amer: 58 mL/min — ABNORMAL LOW (ref >60)
Glucose, Bld: 112 mg/dL — ABNORMAL HIGH (ref 70–99)
Potassium: 4.3 mmol/L (ref 3.5–5.1)
Sodium: 141 mmol/L (ref 135–145)
Total Bilirubin: 0.7 mg/dL (ref 0.3–1.2)
Total Protein: 7.3 g/dL (ref 6.5–8.1)

## 2020-01-02 LAB — I-STAT VENOUS BLOOD GAS, ED
Acid-Base Excess: 1 mmol/L (ref 0.0–2.0)
Bicarbonate: 26.7 mmol/L (ref 20.0–28.0)
Calcium, Ion: 1.25 mmol/L (ref 1.15–1.40)
HCT: 37 % (ref 36.0–46.0)
Hemoglobin: 12.6 g/dL (ref 12.0–15.0)
O2 Saturation: 99 %
Potassium: 4.3 mmol/L (ref 3.5–5.1)
Sodium: 146 mmol/L — ABNORMAL HIGH (ref 135–145)
TCO2: 28 mmol/L (ref 22–32)
pCO2, Ven: 43.5 mmHg — ABNORMAL LOW (ref 44.0–60.0)
pH, Ven: 7.396 (ref 7.250–7.430)
pO2, Ven: 159 mmHg — ABNORMAL HIGH (ref 32.0–45.0)

## 2020-01-02 LAB — CBG MONITORING, ED: Glucose-Capillary: 125 mg/dL — ABNORMAL HIGH (ref 70–99)

## 2020-01-02 LAB — LACTIC ACID, PLASMA: Lactic Acid, Venous: 2.8 mmol/L (ref 0.5–1.9)

## 2020-01-02 LAB — AMMONIA: Ammonia: 21 umol/L (ref 9–35)

## 2020-01-02 MED ORDER — LACTATED RINGERS IV BOLUS
500.0000 mL | Freq: Once | INTRAVENOUS | Status: AC
  Administered 2020-01-02: 500 mL via INTRAVENOUS

## 2020-01-02 NOTE — Discharge Instructions (Addendum)
All the labs look good except she was dehydrated and probably was up too long in her wheelchair.

## 2020-01-02 NOTE — ED Notes (Signed)
Attempted to get labs ordered by provider not success, multiple attempts made by RN and phlebotomist.

## 2020-01-02 NOTE — ED Provider Notes (Signed)
[facility] EMERGENCY DEPARTMENT Provider Note   CSN: [id number] Arrival date & time: 01/02/20  1459     History Chief Complaint  Patient presents with  . Loss of Consciousness    Robin Moreno is a 86 y.o. female.  Patient is a 86 year old female with a history of PE on Eliquis, CKD, CVA, bedbound who is currently under hospice care who lives with her son and is being brought in by EMS due to an unresponsive episode.  Son reports that patient was her normal self today.  He got her out of bed and gave her a bath and she ate some oatmeal for breakfast.  She was getting a new hospital bed today and they came and disassembled her old bed and put in the new bed and she was in the wheelchair for approximately an hour.  However they forgot the rails for her bed and had to drive back to [facility] to get the rails.  She remained in her wheelchair for another hour and son reports that he went in to get a glass of water and when he came back she was unresponsive sitting in the chair.  He was not sure if she was breathing or not and he called 911.  He lowered her to the floor and started doing chest compressions and rescue breathing.  When EMS arrived patient was breathing and satting 100% on room air.  However she was not responsive to voice or stimulation.  Son reports that she has done this in the past in a similar fashion.  Normally she is bedbound most of the time and rarely sits up for more than 30 minutes to an hour in a wheelchair.  She has recently started baclofen due to restless legs and not sleeping at night but has had no other medication changes.  He has noted it has made her drowsy.  She has not had any fever, cough, diarrhea, change in urine, vomiting.  Her appetite's been the same.  She was awake and talking to them regularly earlier today.  The history is provided by a caregiver and the EMS personnel. The history is limited by the condition of the patient.  Loss of  Consciousness Episode history:  Single Most recent episode:  Today Timing:  Constant Progression:  Partially resolved Chronicity:  Recurrent      Past Medical History:  Diagnosis Date  . Arthritis   . CVA (cerebral vascular accident) (HCC)   . Degenerative disk disease   . Hypercholesterolemia   . Hypertension   . Osteoarthritis of right knee   . Renal disorder   . Stage 3a chronic kidney disease (HCC) 09/01/2019    Patient Active Problem List   Diagnosis Date Noted  . Failure to thrive in adult 11/24/2019  . Palliative care by specialist   . Goals of care, counseling/discussion   . Advanced directives, counseling/discussion   . DNI (do not intubate)   . Concern about end of life   . Encounter for hospice care discussion   . Somnolence 11/23/2019  . Pulmonary embolism on right (HCC) 11/20/2019  . Pressure injury of skin 11/18/2019  . Respiratory distress 11/17/2019  . Altered mental status   . Respiratory arrest (HCC)   . Facial twitching 09/01/2019  . Stage 3a chronic kidney disease (HCC) 09/01/2019  . DNR (do not resuscitate) 09/01/2019  . COVID-19 07/30/2019  . Essential hypertension 07/30/2019  . Dyslipidemia 07/30/2019  . Pre-syncope 07/30/2019  . Viral gastroenteritis  due to [place] agent 09/19/2018  . Acute kidney injury superimposed on CKD (HCC) 09/18/2018  . Diarrhea 09/18/2018  . Urinary tract infection 09/18/2018  . Generalized weakness 09/18/2018  . Arthritis 09/18/2018    Past Surgical History:  Procedure Laterality Date  . ABDOMINAL HYSTERECTOMY    . TOTAL HIP ARTHROPLASTY       OB History   No obstetric history on file.     No family history on file.  Social History   Tobacco Use  . Smoking status: Never Smoker  . Smokeless tobacco: Never Used  Vaping Use  . Vaping Use: Never used  Substance Use Topics  . Alcohol use: No  . Drug use: No    Home Medications Prior to Admission medications   Medication Sig Start Date End Date  Taking? Authorizing Provider  albuterol (VENTOLIN HFA) 108 (90 Base) MCG/ACT inhaler Inhale 1-2 puffs into the lungs every 4 (four) hours as needed for wheezing or shortness of breath.    Yes [provider]  apixaban (ELIQUIS) 5 MG TABS tablet Please take 10 mg oral twice daily for next 5 days, then transition to 5 mg oral twice daily from 11/28/2019 Patient taking differently: Take 5 mg by mouth 2 (two) times daily.  11/22/19  Yes Elgergawy, Leana Roe, MD  Ascorbic Acid (VITAMIN C PO) Take 1 tablet by mouth daily.   Yes [provider]  baclofen (LIORESAL) 10 MG tablet Take 10 mg by mouth 2 (two) times daily. 11/16/19  Yes [provider]  Cholecalciferol (VITAMIN D) 2000 UNITS tablet Take 2,000 Units by mouth daily.     Yes [provider]  fluticasone (FLONASE) 50 MCG/ACT nasal spray Place 1 spray into both nostrils daily as needed for allergies or rhinitis.  11/04/19  Yes [provider]  gabapentin (NEURONTIN) 100 MG capsule Take 1 capsule (100 mg total) by mouth 3 (three) times daily. 09/02/19  Yes Burnadette Pop, MD  meclizine (ANTIVERT) 12.5 MG tablet Take 1 tablet (12.5 mg total) by mouth 3 (three) times daily as needed for dizziness. 07/23/18  Yes Pricilla Loveless, MD  metoprolol tartrate (LOPRESSOR) 25 MG tablet Take 1 tablet (25 mg total) by mouth 2 (two) times daily. 11/22/19  Yes Elgergawy, Leana Roe, MD  Multiple Vitamins-Minerals (ZINC PO) Take 1 tablet by mouth daily.   Yes [provider]  pantoprazole (PROTONIX) 40 MG tablet Take 1 tablet (40 mg total) by mouth daily. 11/23/19  Yes Elgergawy, Leana Roe, MD  Polyethyl Glycol-Propyl Glycol (SYSTANE OP) Place 1 drop into both eyes every morning.   Yes [provider]  rosuvastatin (CRESTOR) 10 MG tablet Take 10 mg by mouth daily. 09/19/19  Yes [provider]  acetaminophen (TYLENOL) 325 MG tablet Take 2 tablets (650 mg total) by mouth every 6 (six) hours as needed. Patient not  taking: Reported on 01/02/2020 01/04/19   Derwood Kaplan, MD    Allergies    Patient has no known allergies.  Review of Systems   Review of Systems  Cardiovascular: Positive for syncope.  All other systems reviewed and are negative.   Physical Exam Updated Vital Signs BP 137/89 (BP Location: Right Arm)   Pulse (!) 59   Resp 19   SpO2 100%   Physical Exam Vitals and nursing note reviewed.  Constitutional:      General: She is not in acute distress.    Appearance: She is well-developed and normal weight.     Comments: Patient's eyes are  open and when calling her name her eyes will move slightly but she will not follow commands  HENT:     Head: Normocephalic and atraumatic.     Mouth/Throat:     Mouth: Mucous membranes are moist.  Eyes:     Pupils: Pupils are equal, round, and reactive to light.  Cardiovascular:     Rate and Rhythm: Normal rate and regular rhythm.     Heart sounds: Normal heart sounds. No murmur heard.  No friction rub.  Pulmonary:     Effort: Pulmonary effort is normal.     Breath sounds: Normal breath sounds. No wheezing or rales.  Abdominal:     General: Bowel sounds are normal. There is no distension.     Palpations: Abdomen is soft.     Tenderness: There is no abdominal tenderness. There is no guarding or rebound.     Comments: Mild firmness over the bladder  Musculoskeletal:        General: No tenderness. Normal range of motion.     Comments: No edema  Skin:    General: Skin is warm and dry.     Findings: No rash.  Neurological:     Mental Status: She is lethargic.     Comments: Mild contractures noted in the feet.  Patient is not moving arms or legs at this time.   Psychiatric:     Comments: Will slightly open eyes to voice but no other responsive behavior at this time     ED Results / Procedures / Treatments   Labs (all labs ordered are listed, but only abnormal results are displayed) Labs Reviewed  CBC WITH DIFFERENTIAL/PLATELET -  Abnormal; Notable for the following components:      Result Value   Abs Immature Granulocytes 0.08 (*)    All other components within normal limits  COMPREHENSIVE METABOLIC PANEL - Abnormal; Notable for the following components:   Glucose, Bld 112 (*)    Albumin 3.0 (*)    GFR calc non Af Amer 58 (*)    All other components within normal limits  LACTIC ACID, PLASMA - Abnormal; Notable for the following components:   Lactic Acid, Venous 2.8 (*)    All other components within normal limits  CBG MONITORING, ED - Abnormal; Notable for the following components:   Glucose-Capillary 125 (*)    All other components within normal limits  I-STAT VENOUS BLOOD GAS, ED - Abnormal; Notable for the following components:   pCO2, Ven 43.5 (*)    pO2, Ven 159.0 (*)    Sodium 146 (*)    All other components within normal limits  AMMONIA    EKG EKG Interpretation  Date/Time:  Wednesday January 02 2020 16:46:08 EDT Ventricular Rate:  61 PR Interval:    QRS Duration: 110 QT Interval:  481 QTC Calculation: 485 R Axis:   32 Text Interpretation: Sinus rhythm Incomplete left bundle branch block No significant change since last tracing Confirmed by Gwyneth Sprout ([id number]) on 01/02/2020 5:22:42 PM   Radiology CT Head Wo Contrast  Result Date: 01/02/2020 CLINICAL DATA:  Altered mental status EXAM: CT HEAD WITHOUT CONTRAST TECHNIQUE: Contiguous axial images were obtained from the base of the skull through the vertex without intravenous contrast. COMPARISON:  12/08/2019 FINDINGS: Brain: Mild motion artifact slightly limits evaluation of the structures at the skull base. Parenchymal volume loss is commensurate with the patient's age. Mild periventricular white matter changes are present likely reflecting the sequela of small vessel ischemia. Remote left  frontal cortical infarct is again noted. No abnormal intra or extra-axial mass lesion or fluid collection. No abnormal mass effect or midline shift. No  evidence of acute intracranial hemorrhage or infarct. Ventricular size is normal. Cerebellum unremarkable. Vascular: No asymmetric hyperdense vasculature at the skull base. Skull: Intact Sinuses/Orbits: Paranasal sinuses are clear. Orbits are unremarkable. Other: Mastoid air cells and middle ear cavities are clear. IMPRESSION: Remote left frontal cortical infarct. Stable mild senescent changes. No evidence of acute intracranial hemorrhage or infarct. Electronically Signed   By: Helyn Numbers MD   On: 01/02/2020 17:34   DG Chest Port 1 View  Result Date: 01/02/2020 CLINICAL DATA:  Syncopal episode. EXAM: PORTABLE CHEST 1 VIEW COMPARISON:  Radiograph 12/08/2019 FINDINGS: Low lung volumes, similar to prior.The cardiomediastinal contours are unchanged. Normal heart size with mild aortic atherosclerosis and tortuosity. Mitral annulus calcifications. The lungs are clear. Pulmonary vasculature is normal. No consolidation, pleural effusion, or pneumothorax. No acute osseous abnormalities are seen. IMPRESSION: Low lung volumes without acute abnormality. Electronically Signed   By: Narda Rutherford M.D.   On: 01/02/2020 16:03    Procedures Procedures (including critical care time)  Medications Ordered in ED Medications  lactated ringers bolus 500 mL (500 mLs Intravenous New Bag/Given 01/02/20 1609)    ED Course  I have reviewed the triage vital signs and the nursing notes.  Pertinent labs & imaging results that were available during my care of the patient were reviewed by me and considered in my medical decision making (see chart for details).    MDM Rules/Calculators/A&P                          Elderly female with multiple medical problems presenting today with what appears to be a syncopal event while sitting in her wheelchair.  Patient son gives a history and she has had no recent infectious symptoms.  She did have Covid a month and a half ago but was able to go home and is still living with her  son currently and hospice comes in.  Today's event sounds like a syncopal event most likely from sitting up for a prolonged period in the wheelchair as patient is severely deconditioned.  Initially on exam she was very lethargic however she was given a small bolus of fluid and has returned to baseline.  There was no seizure-like activity.  X-ray and EKG are unchanged.  Head CT shows no acute bleed.  6:48 PM Labs show a lactate of 2.8 but CMP is at baseline.  VBG without evidence of CO2 retention and ammonia is within normal limits.  Head CT and chest x-ray are within normal limits.  EKG is unchanged.  CBC wnl.  Patient did receive IV fluids.  Now on exam she is moving around and looking around more and seems to be at baseline.  Suspect mild dehydration but also syncope related to possible orthostasis as she has not been upright for that long for quite some time.  Findings discussed with her son.  Patient will be discharged back to her home which she is in agreement with.  MDM Number of Diagnoses or Management Options   Amount and/or Complexity of Data Reviewed Clinical lab tests: ordered and reviewed Tests in the radiology section of CPT: ordered and reviewed Tests in the medicine section of CPT: ordered and reviewed Decide to obtain previous medical records or to obtain history from someone other than the patient: yes Obtain history from someone  other than the patient: yes Review and summarize past medical records: yes Independent visualization of images, tracings, or specimens: yes  Risk of Complications, Morbidity, and/or Mortality Presenting problems: moderate Diagnostic procedures: low Management options: low  Patient Progress Patient progress: improved   Final Clinical Impression(s) / ED Diagnoses Final diagnoses:  Syncope and collapse  Dehydration    Rx / DC Orders ED Discharge Orders    None       Gwyneth Sprout, MD 01/02/20 1940

## 2020-01-02 NOTE — ED Notes (Signed)
Date and time results received: 01/02/20   Test: lactic  Critical Value: 2.8  Name of Provider Notified: [place] Orders Received? Or Actions Taken?:

## 2020-01-02 NOTE — ED Triage Notes (Signed)
Pt brought to ED by [facility] from home for c/o LOC. Per EMS pt was normal and responsive at 12:30 and son just found her completely unresponsive on her wheel chair pta to ED. EMS unable to give detail information, prior hx of stroke unknown if any deficit and cardiac arrest. BP 127/86, HR 60 SPO2 97% on NRM

## 2020-01-03 NOTE — ED Notes (Signed)
[facility] at bedside for transport.  

## 2020-01-06 ENCOUNTER — Emergency Department (HOSPITAL_COMMUNITY)

## 2020-01-06 ENCOUNTER — Emergency Department (HOSPITAL_COMMUNITY)
Admission: EM | Admit: 2020-01-06 | Discharge: 2020-01-06 | Disposition: A | Discharge status: Home / Self Care | Attending: Emergency Medicine | Admitting: Emergency Medicine

## 2020-01-06 DIAGNOSIS — W458XXA Other foreign body or object entering through skin, initial encounter: Secondary | ICD-10-CM | POA: Diagnosis not present

## 2020-01-06 DIAGNOSIS — I129 Hypertensive chronic kidney disease with stage 1 through stage 4 chronic kidney disease, or unspecified chronic kidney disease: Secondary | ICD-10-CM | POA: Diagnosis not present

## 2020-01-06 DIAGNOSIS — N1831 Chronic kidney disease, stage 3a: Secondary | ICD-10-CM | POA: Insufficient documentation

## 2020-01-06 DIAGNOSIS — F0390 Unspecified dementia without behavioral disturbance: Secondary | ICD-10-CM | POA: Insufficient documentation

## 2020-01-06 DIAGNOSIS — Z96643 Presence of artificial hip joint, bilateral: Secondary | ICD-10-CM | POA: Diagnosis not present

## 2020-01-06 DIAGNOSIS — R059 Cough, unspecified: Secondary | ICD-10-CM | POA: Diagnosis present

## 2020-01-06 DIAGNOSIS — T17908A Unspecified foreign body in respiratory tract, part unspecified causing other injury, initial encounter: Secondary | ICD-10-CM | POA: Diagnosis not present

## 2020-01-06 NOTE — ED Provider Notes (Signed)
[facility]-EMERGENCY DEPT [facility] Emergency Department Provider Note MRN:  [id number]  Arrival date & time: 01/06/20     Chief Complaint   Cough History of Present Illness   Robin Moreno is a 86 y.o. year-old female with a history of stroke, dementia presenting to the ED with chief complaint of cough.  About 30 minutes after having a meal, patient began exhibiting some cough and some gurgling sounds in her throat. This concerned son, who gave her throat numbing spray and decided to bring her here for evaluation.  I was unable to obtain an accurate HPI, PMH, or ROS due to the patient's dementia.  Level 5 caveat.   Review of Systems  Positive for cough.  Patient's Health History    Past Medical History:  Diagnosis Date  . Arthritis   . CVA (cerebral vascular accident) (HCC)   . Degenerative disk disease   . Hypercholesterolemia   . Hypertension   . Osteoarthritis of right knee   . Renal disorder   . Stage 3a chronic kidney disease (HCC) 09/01/2019    Past Surgical History:  Procedure Laterality Date  . ABDOMINAL HYSTERECTOMY    . TOTAL HIP ARTHROPLASTY      No family history on file.  Social History   Socioeconomic History  . Marital status: Widowed    Spouse name: Not on file  . Number of children: Not on file  . Years of education: Not on file  . Highest education level: Not on file  Occupational History  . Not on file  Tobacco Use  . Smoking status: Never Smoker  . Smokeless tobacco: Never Used  Vaping Use  . Vaping Use: Never used  Substance and Sexual Activity  . Alcohol use: No  . Drug use: No  . Sexual activity: Not Currently  Other Topics Concern  . Not on file  Social History Narrative  . Not on file   Social Determinants of Health   Financial Resource Strain:   . Difficulty of Paying Living Expenses: Not on file  Food Insecurity:   . Worried About Programme researcher, broadcasting/film/video in the Last Year: Not on file  . Ran Out of Food in the Last Year: Not  on file  Transportation Needs:   . Lack of Transportation (Medical): Not on file  . Lack of Transportation (Non-Medical): Not on file  Physical Activity:   . Days of Exercise per Week: Not on file  . Minutes of Exercise per Session: Not on file  Stress:   . Feeling of Stress : Not on file  Social Connections:   . Frequency of Communication with Friends and Family: Not on file  . Frequency of Social Gatherings with Friends and Family: Not on file  . Attends Religious Services: Not on file  . Active Member of Clubs or Organizations: Not on file  . Attends Banker Meetings: Not on file  . Marital Status: Not on file  Intimate Partner Violence:   . Fear of Current or Ex-Partner: Not on file  . Emotionally Abused: Not on file  . Physically Abused: Not on file  . Sexually Abused: Not on file     Physical Exam   Vitals:   01/06/20 0306 01/06/20 0307  BP:  (!) 154/57  Pulse:  63  Temp:  98.5 F (36.9 C)  SpO2: 97% 97%    CONSTITUTIONAL:  Chronically ill-appearing, NAD NEURO:  Somnolent, wakes to voice, moves all extremities EYES:  eyes equal and  reactive ENT/NECK:  no LAD, no JVD CARDIO:  regular rate, well-perfused, normal S1 and S2 PULM:  CTAB no wheezing or rhonchi GI/GU:  normal bowel sounds, non-distended, non-tender MSK/SPINE:  No gross deformities, no edema SKIN:  no rash, atraumatic PSYCH:  Unable to assess  *Additional and/or pertinent findings included in MDM below  Diagnostic and Interventional Summary    EKG Interpretation  Date/Time:    Ventricular Rate:    PR Interval:    QRS Duration:   QT Interval:    QTC Calculation:   R Axis:     Text Interpretation:        Labs Reviewed - No data to display  DG Chest 1 View  Final Result      Medications - No data to display   Procedures  /  Critical Care Procedures  ED Course and Medical Decision Making  I have reviewed the triage vital signs, the nursing notes, and pertinent available  records from the EMR.  Listed above are laboratory and imaging tests that I personally ordered, reviewed, and interpreted and then considered in my medical decision making (see below for details).  Possible aspiration event at home but currently with NAD, normal vital signs, clear lungs, at neurological baseline.  Screening cxr, anticipating discharge.     X-ray normal, upon reassessment patient continues to look well, normal vital signs, no increased work of breathing, no stridor, no wheezing, appropriate for discharge.  Elmer Sow. Pilar Plate, MD [facility] Emergency Medicine [facility] [email or web address]  Final Clinical Impressions(s) / ED Diagnoses     ICD-10-CM   1. Aspiration into respiratory tract, initial encounter  T17.908A     ED Discharge Orders    None       Discharge Instructions Discussed with and Provided to Patient:     Discharge Instructions     You were evaluated in the Emergency Department and after careful evaluation, we did not find any emergent condition requiring admission or further testing in the hospital.  Your exam/testing today was overall reassuring.  Please return to the Emergency Department if you experience any worsening of your condition.  Thank you for allowing [place] to be a part of your care.        Sabas Sous, MD 01/06/20 [phone]

## 2020-01-06 NOTE — ED Notes (Signed)
Ptar called 

## 2020-01-06 NOTE — Discharge Instructions (Addendum)
You were evaluated in the Emergency Department and after careful evaluation, we did not find any emergent condition requiring admission or further testing in the hospital.  Your exam/testing today was overall reassuring.  Please return to the Emergency Department if you experience any worsening of your condition.  Thank you for allowing us to be a part of your care.  

## 2020-01-06 NOTE — ED Triage Notes (Signed)
Pt arrived to ED via EMS; EMS was called by Son due patient started to have a cough and some "girggling" sounds Per Son patient is not as talkative as usual and he gave a throat numbing spray to help with sx; Patient has hx of dementia and bed bound; Pt does not  Follow commands on arrival but his alert; Patient has hs stroke per EMS; Patient does not appear in acute distress upon arrival to ED-Monique,RN

## 2020-01-07 ENCOUNTER — Telehealth: Payer: Medicare Other | Admitting: Internal Medicine

## 2020-02-10 ENCOUNTER — Inpatient Hospital Stay (HOSPITAL_COMMUNITY)
Admission: EM | Admit: 2020-02-10 | Discharge: 2020-02-13 | DRG: 871 | Disposition: A | Discharge status: Hospice | Attending: Internal Medicine | Admitting: Internal Medicine

## 2020-02-10 ENCOUNTER — Emergency Department (HOSPITAL_COMMUNITY)

## 2020-02-10 DIAGNOSIS — Z90710 Acquired absence of both cervix and uterus: Secondary | ICD-10-CM

## 2020-02-10 DIAGNOSIS — D689 Coagulation defect, unspecified: Secondary | ICD-10-CM | POA: Diagnosis present

## 2020-02-10 DIAGNOSIS — R011 Cardiac murmur, unspecified: Secondary | ICD-10-CM | POA: Diagnosis present

## 2020-02-10 DIAGNOSIS — A409 Streptococcal sepsis, unspecified: Principal | ICD-10-CM | POA: Diagnosis present

## 2020-02-10 DIAGNOSIS — Z8616 Personal history of COVID-19: Secondary | ICD-10-CM

## 2020-02-10 DIAGNOSIS — Z96649 Presence of unspecified artificial hip joint: Secondary | ICD-10-CM | POA: Diagnosis present

## 2020-02-10 DIAGNOSIS — Z978 Presence of other specified devices: Secondary | ICD-10-CM | POA: Diagnosis not present

## 2020-02-10 DIAGNOSIS — E870 Hyperosmolality and hypernatremia: Secondary | ICD-10-CM | POA: Diagnosis not present

## 2020-02-10 DIAGNOSIS — E785 Hyperlipidemia, unspecified: Secondary | ICD-10-CM | POA: Diagnosis present

## 2020-02-10 DIAGNOSIS — I422 Other hypertrophic cardiomyopathy: Secondary | ICD-10-CM | POA: Diagnosis present

## 2020-02-10 DIAGNOSIS — Z7189 Other specified counseling: Secondary | ICD-10-CM | POA: Diagnosis not present

## 2020-02-10 DIAGNOSIS — R627 Adult failure to thrive: Secondary | ICD-10-CM | POA: Diagnosis present

## 2020-02-10 DIAGNOSIS — R092 Respiratory arrest: Secondary | ICD-10-CM | POA: Diagnosis present

## 2020-02-10 DIAGNOSIS — M1990 Unspecified osteoarthritis, unspecified site: Secondary | ICD-10-CM | POA: Diagnosis present

## 2020-02-10 DIAGNOSIS — E7800 Pure hypercholesterolemia, unspecified: Secondary | ICD-10-CM | POA: Diagnosis present

## 2020-02-10 DIAGNOSIS — N1831 Chronic kidney disease, stage 3a: Secondary | ICD-10-CM | POA: Diagnosis present

## 2020-02-10 DIAGNOSIS — J9601 Acute respiratory failure with hypoxia: Secondary | ICD-10-CM | POA: Diagnosis present

## 2020-02-10 DIAGNOSIS — E860 Dehydration: Secondary | ICD-10-CM | POA: Diagnosis present

## 2020-02-10 DIAGNOSIS — I959 Hypotension, unspecified: Secondary | ICD-10-CM | POA: Diagnosis not present

## 2020-02-10 DIAGNOSIS — E876 Hypokalemia: Secondary | ICD-10-CM | POA: Diagnosis not present

## 2020-02-10 DIAGNOSIS — Z7901 Long term (current) use of anticoagulants: Secondary | ICD-10-CM

## 2020-02-10 DIAGNOSIS — Z452 Encounter for adjustment and management of vascular access device: Secondary | ICD-10-CM

## 2020-02-10 DIAGNOSIS — I130 Hypertensive heart and chronic kidney disease with heart failure and stage 1 through stage 4 chronic kidney disease, or unspecified chronic kidney disease: Secondary | ICD-10-CM | POA: Diagnosis present

## 2020-02-10 DIAGNOSIS — Z515 Encounter for palliative care: Secondary | ICD-10-CM | POA: Diagnosis not present

## 2020-02-10 DIAGNOSIS — I509 Heart failure, unspecified: Secondary | ICD-10-CM | POA: Diagnosis present

## 2020-02-10 DIAGNOSIS — R6521 Severe sepsis with septic shock: Secondary | ICD-10-CM | POA: Diagnosis present

## 2020-02-10 DIAGNOSIS — Z681 Body mass index (BMI) 19 or less, adult: Secondary | ICD-10-CM

## 2020-02-10 DIAGNOSIS — K7200 Acute and subacute hepatic failure without coma: Secondary | ICD-10-CM | POA: Diagnosis present

## 2020-02-10 DIAGNOSIS — R4182 Altered mental status, unspecified: Secondary | ICD-10-CM | POA: Diagnosis not present

## 2020-02-10 DIAGNOSIS — Z8701 Personal history of pneumonia (recurrent): Secondary | ICD-10-CM

## 2020-02-10 DIAGNOSIS — Z79899 Other long term (current) drug therapy: Secondary | ICD-10-CM

## 2020-02-10 DIAGNOSIS — R570 Cardiogenic shock: Secondary | ICD-10-CM | POA: Diagnosis present

## 2020-02-10 DIAGNOSIS — G9341 Metabolic encephalopathy: Secondary | ICD-10-CM | POA: Diagnosis present

## 2020-02-10 DIAGNOSIS — D6959 Other secondary thrombocytopenia: Secondary | ICD-10-CM | POA: Diagnosis present

## 2020-02-10 DIAGNOSIS — Z8673 Personal history of transient ischemic attack (TIA), and cerebral infarction without residual deficits: Secondary | ICD-10-CM

## 2020-02-10 LAB — I-STAT ARTERIAL BLOOD GAS, ED
Acid-base deficit: 19 mmol/L — ABNORMAL HIGH (ref 0.0–2.0)
Bicarbonate: 8.7 mmol/L — ABNORMAL LOW (ref 20.0–28.0)
Calcium, Ion: 1.06 mmol/L — ABNORMAL LOW (ref 1.15–1.40)
HCT: 27 % — ABNORMAL LOW (ref 36.0–46.0)
Hemoglobin: 9.2 g/dL — ABNORMAL LOW (ref 12.0–15.0)
O2 Saturation: 100 %
Potassium: 4 mmol/L (ref 3.5–5.1)
Sodium: 147 mmol/L — ABNORMAL HIGH (ref 135–145)
TCO2: 9 mmol/L — ABNORMAL LOW (ref 22–32)
pCO2 arterial: 24.9 mmHg — ABNORMAL LOW (ref 32.0–48.0)
pH, Arterial: 7.153 — CL (ref 7.350–7.450)
pO2, Arterial: 211 mmHg — ABNORMAL HIGH (ref 83.0–108.0)

## 2020-02-10 LAB — CBC WITH DIFFERENTIAL/PLATELET
Abs Immature Granulocytes: 0.41 10*3/uL — ABNORMAL HIGH (ref 0.00–0.07)
Basophils Absolute: 0 10*3/uL (ref 0.0–0.1)
Basophils Relative: 0 %
Eosinophils Absolute: 0.2 10*3/uL (ref 0.0–0.5)
Eosinophils Relative: 1 %
HCT: 28.2 % — ABNORMAL LOW (ref 36.0–46.0)
Hemoglobin: 7.9 g/dL — ABNORMAL LOW (ref 12.0–15.0)
Immature Granulocytes: 2 %
Lymphocytes Relative: 13 %
Lymphs Abs: 2.8 10*3/uL (ref 0.7–4.0)
MCH: 28.8 pg (ref 26.0–34.0)
MCHC: 28 g/dL — ABNORMAL LOW (ref 30.0–36.0)
MCV: 102.9 fL — ABNORMAL HIGH (ref 80.0–100.0)
Monocytes Absolute: 1.5 10*3/uL — ABNORMAL HIGH (ref 0.1–1.0)
Monocytes Relative: 7 %
Neutro Abs: 16.8 10*3/uL — ABNORMAL HIGH (ref 1.7–7.7)
Neutrophils Relative %: 77 %
Platelets: 146 10*3/uL — ABNORMAL LOW (ref 150–400)
RBC: 2.74 MIL/uL — ABNORMAL LOW (ref 3.87–5.11)
RDW: 15.6 % — ABNORMAL HIGH (ref 11.5–15.5)
WBC: 21.8 10*3/uL — ABNORMAL HIGH (ref 4.0–10.5)
nRBC: 0.1 % (ref 0.0–0.2)

## 2020-02-10 LAB — POCT I-STAT 7, (LYTES, BLD GAS, ICA,H+H)
Acid-base deficit: 10 mmol/L — ABNORMAL HIGH (ref 0.0–2.0)
Bicarbonate: 13.3 mmol/L — ABNORMAL LOW (ref 20.0–28.0)
Calcium, Ion: 1.08 mmol/L — ABNORMAL LOW (ref 1.15–1.40)
HCT: 32 % — ABNORMAL LOW (ref 36.0–46.0)
Hemoglobin: 10.9 g/dL — ABNORMAL LOW (ref 12.0–15.0)
O2 Saturation: 98 %
Patient temperature: 98.3
Potassium: 4.1 mmol/L (ref 3.5–5.1)
Sodium: 147 mmol/L — ABNORMAL HIGH (ref 135–145)
TCO2: 14 mmol/L — ABNORMAL LOW (ref 22–32)
pCO2 arterial: 21 mmHg — ABNORMAL LOW (ref 32.0–48.0)
pH, Arterial: 7.409 (ref 7.350–7.450)
pO2, Arterial: 96 mmHg (ref 83.0–108.0)

## 2020-02-10 LAB — CBG MONITORING, ED: Glucose-Capillary: 69 mg/dL — ABNORMAL LOW (ref 70–99)

## 2020-02-10 LAB — URINALYSIS, ROUTINE W REFLEX MICROSCOPIC
Bilirubin Urine: NEGATIVE
Glucose, UA: NEGATIVE mg/dL
Ketones, ur: NEGATIVE mg/dL
Nitrite: NEGATIVE
Protein, ur: 100 mg/dL — AB
Specific Gravity, Urine: 1.013 (ref 1.005–1.030)
WBC, UA: >50 WBC/hpf — ABNORMAL HIGH (ref 0–5)
pH: 7 (ref 5.0–8.0)

## 2020-02-10 LAB — CBC
HCT: 35.6 % — ABNORMAL LOW (ref 36.0–46.0)
Hemoglobin: 10.2 g/dL — ABNORMAL LOW (ref 12.0–15.0)
MCH: 28.6 pg (ref 26.0–34.0)
MCHC: 28.7 g/dL — ABNORMAL LOW (ref 30.0–36.0)
MCV: 99.7 fL (ref 80.0–100.0)
Platelets: 183 10*3/uL (ref 150–400)
RBC: 3.57 MIL/uL — ABNORMAL LOW (ref 3.87–5.11)
RDW: 15.4 % (ref 11.5–15.5)
WBC: 31.6 10*3/uL — ABNORMAL HIGH (ref 4.0–10.5)
nRBC: 0 % (ref 0.0–0.2)

## 2020-02-10 LAB — TROPONIN I (HIGH SENSITIVITY)
Troponin I (High Sensitivity): 27 ng/L — ABNORMAL HIGH (ref <18)
Troponin I (High Sensitivity): 63 ng/L — ABNORMAL HIGH (ref <18)

## 2020-02-10 LAB — I-STAT CHEM 8, ED
BUN: 14 mg/dL (ref 8–23)
Calcium, Ion: 0.82 mmol/L — CL (ref 1.15–1.40)
Chloride: 119 mmol/L — ABNORMAL HIGH (ref 98–111)
Creatinine, Ser: 1 mg/dL (ref 0.44–1.00)
Glucose, Bld: 139 mg/dL — ABNORMAL HIGH (ref 70–99)
HCT: 26 % — ABNORMAL LOW (ref 36.0–46.0)
Hemoglobin: 8.8 g/dL — ABNORMAL LOW (ref 12.0–15.0)
Potassium: 2.9 mmol/L — ABNORMAL LOW (ref 3.5–5.1)
Sodium: 151 mmol/L — ABNORMAL HIGH (ref 135–145)
TCO2: 10 mmol/L — ABNORMAL LOW (ref 22–32)

## 2020-02-10 LAB — COMPREHENSIVE METABOLIC PANEL
ALT: 43 U/L (ref 0–44)
AST: 94 U/L — ABNORMAL HIGH (ref 15–41)
Albumin: 1.8 g/dL — ABNORMAL LOW (ref 3.5–5.0)
Alkaline Phosphatase: 95 U/L (ref 38–126)
Anion gap: 15 (ref 5–15)
BUN: 18 mg/dL (ref 8–23)
CO2: 16 mmol/L — ABNORMAL LOW (ref 22–32)
Calcium: 7.6 mg/dL — ABNORMAL LOW (ref 8.9–10.3)
Chloride: 115 mmol/L — ABNORMAL HIGH (ref 98–111)
Creatinine, Ser: 1.59 mg/dL — ABNORMAL HIGH (ref 0.44–1.00)
GFR, Estimated: 30 mL/min — ABNORMAL LOW (ref >60)
Glucose, Bld: 203 mg/dL — ABNORMAL HIGH (ref 70–99)
Potassium: 4.1 mmol/L (ref 3.5–5.1)
Sodium: 146 mmol/L — ABNORMAL HIGH (ref 135–145)
Total Bilirubin: 0.9 mg/dL (ref 0.3–1.2)
Total Protein: 5.7 g/dL — ABNORMAL LOW (ref 6.5–8.1)

## 2020-02-10 LAB — GLUCOSE, CAPILLARY
Glucose-Capillary: 115 mg/dL — ABNORMAL HIGH (ref 70–99)
Glucose-Capillary: 248 mg/dL — ABNORMAL HIGH (ref 70–99)
Glucose-Capillary: 231 mg/dL — ABNORMAL HIGH (ref 70–99)

## 2020-02-10 LAB — LACTIC ACID, PLASMA
Lactic Acid, Venous: 7.4 mmol/L (ref 0.5–1.9)
Lactic Acid, Venous: 8.6 mmol/L (ref 0.5–1.9)

## 2020-02-10 LAB — MRSA PCR SCREENING: MRSA by PCR: POSITIVE — AB

## 2020-02-10 LAB — PROTIME-INR
INR: 2.8 — ABNORMAL HIGH (ref 0.8–1.2)
Prothrombin Time: 28.3 seconds — ABNORMAL HIGH (ref 11.4–15.2)

## 2020-02-10 LAB — APTT: aPTT: 49 seconds — ABNORMAL HIGH (ref 24–36)

## 2020-02-10 MED ORDER — VASOPRESSIN 20 UNITS/100 ML INFUSION FOR SHOCK
0.0000 [IU]/min | INTRAVENOUS | Status: DC
  Administered 2020-02-10 – 2020-02-11 (×4): 0.03 [IU]/min via INTRAVENOUS
  Filled 2020-02-10 (×6): qty 100

## 2020-02-10 MED ORDER — LACTATED RINGERS IV SOLN: INTRAVENOUS | Status: AC

## 2020-02-10 MED ORDER — NOREPINEPHRINE 4 MG/250ML-% IV SOLN
INTRAVENOUS | Status: AC
  Administered 2020-02-10: 10 ug/min
  Filled 2020-02-10: qty 250

## 2020-02-10 MED ORDER — HYDROCORTISONE NA SUCCINATE PF 100 MG IJ SOLR
50.0000 mg | Freq: Four times a day (QID) | INTRAMUSCULAR | Status: DC
  Administered 2020-02-10 – 2020-02-13 (×10): 50 mg via INTRAVENOUS
  Filled 2020-02-10 (×10): qty 2

## 2020-02-10 MED ORDER — EPINEPHRINE 0.1 MG/10ML (10 MCG/ML) SYRINGE FOR IV PUSH (FOR BLOOD PRESSURE SUPPORT)
PREFILLED_SYRINGE | INTRAVENOUS | Status: AC
  Filled 2020-02-10: qty 10

## 2020-02-10 MED ORDER — DEXMEDETOMIDINE HCL IN NACL 400 MCG/100ML IV SOLN
0.4000 ug/kg/h | INTRAVENOUS | Status: DC
  Filled 2020-02-10: qty 100

## 2020-02-10 MED ORDER — SODIUM CHLORIDE 0.9 % IV SOLN: 1.0000 g | Freq: Once | INTRAVENOUS | Status: DC

## 2020-02-10 MED ORDER — SODIUM BICARBONATE 8.4 % IV SOLN
INTRAVENOUS | Status: DC
  Filled 2020-02-10 (×5): qty 850

## 2020-02-10 MED ORDER — SODIUM CHLORIDE 0.9 % IV SOLN
1.0000 g | Freq: Once | INTRAVENOUS | Status: AC
  Administered 2020-02-10: 1 g via INTRAVENOUS
  Filled 2020-02-10: qty 10

## 2020-02-10 MED ORDER — EPINEPHRINE 0.1 MG/10ML (10 MCG/ML) SYRINGE FOR IV PUSH (FOR BLOOD PRESSURE SUPPORT)
PREFILLED_SYRINGE | INTRAVENOUS | Status: AC | PRN
  Administered 2020-02-10: 20 ug via INTRAVENOUS

## 2020-02-10 MED ORDER — LACTATED RINGERS IV BOLUS (SEPSIS): 500.0000 mL | Freq: Once | INTRAVENOUS | Status: DC

## 2020-02-10 MED ORDER — ORAL CARE MOUTH RINSE
15.0000 mL | OROMUCOSAL | Status: DC
  Administered 2020-02-10 – 2020-02-13 (×27): 15 mL via OROMUCOSAL

## 2020-02-10 MED ORDER — POLYETHYLENE GLYCOL 3350 17 G PO PACK: 17.0000 g | PACK | Freq: Every day | ORAL | Status: DC | PRN

## 2020-02-10 MED ORDER — DOCUSATE SODIUM 100 MG PO CAPS: 100.0000 mg | ORAL_CAPSULE | Freq: Two times a day (BID) | ORAL | Status: DC | PRN

## 2020-02-10 MED ORDER — SODIUM BICARBONATE 8.4 % IV SOLN
50.0000 meq | Freq: Once | INTRAVENOUS | Status: AC
  Administered 2020-02-10: 50 meq via INTRAVENOUS

## 2020-02-10 MED ORDER — ONDANSETRON HCL 4 MG/2ML IJ SOLN: 4.0000 mg | Freq: Four times a day (QID) | INTRAMUSCULAR | Status: DC | PRN

## 2020-02-10 MED ORDER — MUPIROCIN 2 % EX OINT
1.0000 "application " | TOPICAL_OINTMENT | Freq: Two times a day (BID) | CUTANEOUS | Status: DC
  Administered 2020-02-10 – 2020-02-13 (×6): 1 via NASAL
  Filled 2020-02-10 (×2): qty 22

## 2020-02-10 MED ORDER — HEPARIN SODIUM (PORCINE) 5000 UNIT/ML IJ SOLN
5000.0000 [IU] | Freq: Three times a day (TID) | INTRAMUSCULAR | Status: DC
  Administered 2020-02-10: 5000 [IU] via SUBCUTANEOUS
  Filled 2020-02-10: qty 1

## 2020-02-10 MED ORDER — MIDAZOLAM HCL 2 MG/2ML IJ SOLN: 1.0000 mg | INTRAMUSCULAR | Status: DC | PRN

## 2020-02-10 MED ORDER — SODIUM CHLORIDE 0.9 % IV SOLN: 1.0000 g | INTRAVENOUS | Status: DC

## 2020-02-10 MED ORDER — FENTANYL CITRATE (PF) 100 MCG/2ML IJ SOLN: 50.0000 ug | INTRAMUSCULAR | Status: DC | PRN

## 2020-02-10 MED ORDER — NOREPINEPHRINE 4 MG/250ML-% IV SOLN
0.0000 ug/min | INTRAVENOUS | Status: DC
  Administered 2020-02-10: 34 ug/min via INTRAVENOUS
  Administered 2020-02-10: 5.333 ug/min via INTRAVENOUS
  Administered 2020-02-10: 40 ug/min via INTRAVENOUS
  Filled 2020-02-10 (×3): qty 250

## 2020-02-10 MED ORDER — CHLORHEXIDINE GLUCONATE 0.12% ORAL RINSE (MEDLINE KIT)
15.0000 mL | Freq: Two times a day (BID) | OROMUCOSAL | Status: DC
  Administered 2020-02-10 – 2020-02-13 (×6): 15 mL via OROMUCOSAL

## 2020-02-10 MED ORDER — CHLORHEXIDINE GLUCONATE CLOTH 2 % EX PADS
6.0000 | MEDICATED_PAD | Freq: Every day | CUTANEOUS | Status: DC
  Administered 2020-02-11 – 2020-02-13 (×3): 6 via TOPICAL

## 2020-02-10 MED ORDER — SODIUM BICARBONATE 8.4 % IV SOLN
INTRAVENOUS | Status: AC
  Filled 2020-02-10: qty 50

## 2020-02-10 MED ORDER — FENTANYL CITRATE (PF) 100 MCG/2ML IJ SOLN
50.0000 ug | INTRAMUSCULAR | Status: DC | PRN
  Administered 2020-02-13: 50 ug via INTRAVENOUS
  Filled 2020-02-10: qty 2

## 2020-02-10 MED ORDER — LACTATED RINGERS IV BOLUS (SEPSIS)
1000.0000 mL | Freq: Once | INTRAVENOUS | Status: AC
  Administered 2020-02-10: 1000 mL via INTRAVENOUS

## 2020-02-10 MED ORDER — LACTATED RINGERS IV BOLUS (SEPSIS): 250.0000 mL | Freq: Once | INTRAVENOUS | Status: DC

## 2020-02-10 NOTE — Progress Notes (Signed)
Civil engineer, contracting [facility])  Ms. Hoselton is a current hospice patient with a terminal diagnosis of left frontoparietal cerebral infarction per [facility] MD. She has been our hospice pt for about 6 weeks. During that time, family seems to be struggling with the trajectory of her illness.   Pt was found down at home, family activated EMS. In the ED son [name] decided to rescind her DNR status and requested that aggressive care measures be performed.  Pt intubated and central line placed.  [facility] will follow pt and family to provide support.   Wallis Bamberg RN, BSN, CCRN [facility] Liaison

## 2020-02-10 NOTE — Progress Notes (Signed)
ABG obtained as Epic was down PH: 7.15 PaCO2:24.9 PaO2: 211 BE: -19 HCO3: 8.7 TCO2: 9 SaO2: 100%  Hand Delivered to Dr Tonia Brooms

## 2020-02-10 NOTE — Consult Note (Signed)
Consultation Note Date: 02/10/2020   Patient Name: Robin Moreno  DOB: 20-Apr-1925  MRN: [id number]  Age / Sex: 86 y.o., female  PCP: Jilda Panda, MD Referring Physician: Garner Nash, DO  Reason for Consultation: Establishing goals of care  HPI/Patient Profile: 86 y.o. female  with past medical history of CKD, hypertension, hyperlipidemia, osteoarthritis, prior CVA, and recent COVID-19 pneumonia presented to the ED on 02/10/20 for AMS and unresponsiveness at home. This patient has been with [facility] hospice services for the last 6 weeks; however, son/Daryl requested full aggressive measures for patient on arrival to ED. Patient was intubated and had central line placed upon arrival.  PMT was involved in patient's previous admission in August 2021 for FTT and encephalopathy.  Of note, patient has had 4 admissions and 4 ED visits in the last 6 months. Her albumin on 02/10/20 was 1.8.  Family face treatment option decisions, advanced directive decisions, and anticipatory care needs.  Clinical Assessment and Goals of Care: I have reviewed medical records including EPIC notes, labs, and imaging. Received report from Dr. Valeta Harms. Received report from primary RN - no acute concerns.   Went to visit patient at bedside - son/Daryle was present. Patient was lying in bed intubated, sedated, and not able to participate in conversation. No signs or non-verbal gestures of pain or discomfort noted. No respiratory distress, increased work of breathing, or secretions noted.  Met with son/Daryle  to discuss diagnosis, prognosis, GOC, EOL wishes, disposition, and options.  I introduced Palliative Medicine as specialized medical care for people living with serious illness. It focuses on providing relief from the symptoms and stress of a serious illness. The goal is to improve quality of life for both the patient and the  family.  We discussed a brief life review of the patient as well as functional and nutritional status. She was married but unfortunately her husband passed away in 1973-05-25. They had three children together, but Daryle is the only one still living. Prior to hospitalization, Daryle tells me the patient was unable to walk and wheelchair bound. He reports she was eating and drinking well (but her low albumin shows malnutrition).  We discussed patient's current illness and what it means in the larger context of patient's on-going co-morbidities. The son has a clear understanding of the current medical situation; however, is having a hard time seeing the overall holistic status of the patient. Natural disease trajectory and expectations at EOL were discussed. I attempted to elicit values and goals of care important to the patient. The difference between aggressive medical intervention and comfort care was considered in light of the patient's goals of care. The son was not receptive to conversation and discussion around comfort care. Daryle expressed "I only agreed to hospice because they told me they would send aids to the house." Attempted to review hospice philosophy. Daryle reflected on a time his friend sent a family member to hospice facility and "they let her starve." Expectations at EOL were explained again in detail - including  the body's natural process of slowing and no longer feeling hungry/thirsty at EOL and how hospice allows people to naturally and peacefully pass away.   Daryle was able to say that he knew the patient would not want to be kept on a vent/trach long term. He clearly stated he wanted to give her 48 hours to see how she does because that is what was recommended to him last time and "she came out of it."  Daryle expressed that he was done with the meeting today and knew what he wanted.  Throughout visit Daryle was checking football scores on his phone.   Visit consisted of discussions  dealing with the complex and emotionally intense issues of symptom management and palliative care in the setting of serious and potentially life-threatening illness. Palliative care team will continue to support patient, patient's family, and medical team.  Discussed with family the importance of continued conversation with the medical providers regarding overall plan of care and treatment options, ensuring decisions are within the context of the patient's values and GOCs.    Questions and concerns were addressed. The family was encouraged to call with questions and/or concerns. PMT card was provided.   Primary Decision Maker NEXT OF KIN son/Daryle Jaskot    SUMMARY OF RECOMMENDATIONS:  Continue current full scope medical treatment  Continue partial code status  Son specifically stated he wants 48 hours of watchful waiting before making further decisions  Son stated he knows the patient would not want long term vent/trach  Continue to offer patient's son education on expectations at EOL and hospice philosophy - he does not seem to/ want to understand at this time  PMT will continue to follow holistically  Code Status/Advance Care Planning:  Limited code - no CPR   Palliative Prophylaxis:   Aspiration, Bowel Regimen, Delirium Protocol, Eye Care, Frequent Pain Assessment, Oral Care and Turn Reposition  Additional Recommendations (Limitations, Scope, Preferences):  Full Scope Treatment  Psycho-social/Spiritual:   Desire for further Chaplaincy support:yes - already seen  Additional Recommendations: Education on Hospice  Created space and opportunity for patient and family to express thoughts and feelings regarding patient's current medical situation.   Emotional support and therapeutic listening provided.   Prognosis:   Poor overall prognosis considering her age, deconditioning, fragility, and multiple medical problems  Discharge Planning: To Be Determined       Primary Diagnoses: Present on Admission: . Respiratory arrest ([place])   I have reviewed the medical record, interviewed the patient and family, and examined the patient. The following aspects are pertinent.  Past Medical History:  Diagnosis Date  . Arthritis   . CVA (cerebral vascular accident) ([place])   . Degenerative disk disease   . Hypercholesterolemia   . Hypertension   . Osteoarthritis of right knee   . Renal disorder   . Stage 3a chronic kidney disease ([place]) 09/01/2019   Social History   Socioeconomic History  . Marital status: Widowed    Spouse name: Not on file  . Number of children: Not on file  . Years of education: Not on file  . Highest education level: Not on file  Occupational History  . Not on file  Tobacco Use  . Smoking status: Never Smoker  . Smokeless tobacco: Never Used  Vaping Use  . Vaping Use: Never used  Substance and Sexual Activity  . Alcohol use: No  . Drug use: No  . Sexual activity: Not Currently  Other Topics Concern  . Not on file  Social History Narrative  . Not on file   Social Determinants of Health   Financial Resource Strain:   . Difficulty of Paying Living Expenses: Not on file  Food Insecurity:   . Worried About Charity fundraiser in the Last Year: Not on file  . Ran Out of Food in the Last Year: Not on file  Transportation Needs:   . Lack of Transportation (Medical): Not on file  . Lack of Transportation (Non-Medical): Not on file  Physical Activity:   . Days of Exercise per Week: Not on file  . Minutes of Exercise per Session: Not on file  Stress:   . Feeling of Stress : Not on file  Social Connections:   . Frequency of Communication with Friends and Family: Not on file  . Frequency of Social Gatherings with Friends and Family: Not on file  . Attends Religious Services: Not on file  . Active Member of Clubs or Organizations: Not on file  . Attends Archivist Meetings: Not on file  . Marital Status: Not on  file   No family history on file. Scheduled Meds: . EPINEPHrine      . heparin  5,000 Units Subcutaneous Q8H  . sodium bicarbonate       Continuous Infusions: . cefTRIAXone (ROCEPHIN)  IV    . dexmedetomidine (PRECEDEX) IV infusion    . lactated ringers     And  . lactated ringers    . lactated ringers    . norepinephrine (LEVOPHED) Adult infusion 20 mcg/min (02/10/20 1618)  . sodium bicarbonate (isotonic) 150 mEq in D5W 1000 mL infusion    . vasopressin 0.03 Units/min (02/10/20 1549)   PRN Meds:.docusate sodium, fentaNYL (SUBLIMAZE) injection, fentaNYL (SUBLIMAZE) injection, midazolam, midazolam, ondansetron (ZOFRAN) IV, polyethylene glycol Medications Prior to Admission:  Prior to Admission medications   Medication Sig Start Date End Date Taking? Authorizing Provider  acetaminophen (TYLENOL) 325 MG tablet Take 2 tablets (650 mg total) by mouth every 6 (six) hours as needed. 01/04/19   Varney Biles, MD  albuterol (VENTOLIN HFA) 108 (90 Base) MCG/ACT inhaler Inhale 1-2 puffs into the lungs every 4 (four) hours as needed for wheezing or shortness of breath.     [provider]  apixaban (ELIQUIS) 5 MG TABS tablet Please take 10 mg oral twice daily for next 5 days, then transition to 5 mg oral twice daily from 11/28/2019 Patient taking differently: Take 5 mg by mouth 2 (two) times daily.  11/22/19   Elgergawy, Silver Huguenin, MD  Ascorbic Acid (VITAMIN C PO) Take 1 tablet by mouth daily.    [provider]  baclofen (LIORESAL) 10 MG tablet Take 10 mg by mouth 2 (two) times daily. 11/16/19   [provider]  Cholecalciferol (VITAMIN D) 2000 UNITS tablet Take 2,000 Units by mouth daily.      [provider]  diltiazem (CARDIZEM) 60 MG tablet Take 60 mg by mouth 2 (two) times daily. 01/24/20   [provider]  fluticasone (FLONASE) 50 MCG/ACT nasal spray Place 1 spray into both nostrils daily as needed for allergies or rhinitis.  11/04/19   [provider]  gabapentin (NEURONTIN) 100 MG capsule Take 1 capsule (100 mg total) by mouth 3 (three) times daily. 09/02/19   Shelly Coss, MD  meclizine (ANTIVERT) 12.5 MG tablet Take 1 tablet (12.5 mg total) by mouth 3 (three) times daily as needed for dizziness. 07/23/18   Sherwood Gambler, MD  metoprolol tartrate (LOPRESSOR) 25  MG tablet Take 1 tablet (25 mg total) by mouth 2 (two) times daily. 11/22/19   Elgergawy, Silver Huguenin, MD  Multiple Vitamins-Minerals (ZINC PO) Take 1 tablet by mouth daily.    [provider]  pantoprazole (PROTONIX) 40 MG tablet Take 1 tablet (40 mg total) by mouth daily. 11/23/19   Elgergawy, Silver Huguenin, MD  PHENobarbital (LUMINAL) 64.8 MG tablet Take 64.8 mg by mouth every 12 (twelve) hours as needed. 01/08/20   [provider]  Polyethyl Glycol-Propyl Glycol (SYSTANE OP) Place 1 drop into both eyes every morning.    [provider]  rosuvastatin (CRESTOR) 10 MG tablet Take 10 mg by mouth daily. 09/19/19   [provider]   No Known Allergies Review of Systems  Unable to perform ROS: Intubated    Physical Exam Vitals and nursing note reviewed.  Constitutional:      General: She is not in acute distress.    Appearance: She is cachectic. She is ill-appearing.     Interventions: She is sedated and intubated.  Pulmonary:     Effort: No respiratory distress. She is intubated.  Skin:    General: Skin is warm and dry.  Neurological:     Motor: Weakness present.     Vital Signs: BP 120/66 (BP Location: Right Arm)   Pulse (!) 114   Temp (!) 96.8 F (36 C) (Temporal)   Resp 18   Wt 54 kg   SpO2 (!) 39%   BMI 19.21 kg/m  Pain Scale: 0-10   Pain Score: 0-No pain   SpO2: SpO2: (!) 39 % O2 Device:SpO2: (!) 39 % O2 Flow Rate: .   IO: Intake/output summary: No intake or output data in the 24 hours ending 02/10/20 1732  LBM:   Baseline Weight: Weight: 54 kg Most recent weight: Weight: 54 kg     Palliative Assessment/Data:  PPS 10%     Time In: 1745 Time Out: 1900 Time Total: 75 minutes  Greater than 50%  of this time was spent counseling and coordinating care related to the above assessment and plan.  Signed by: Lin Landsman, NP   Please contact Palliative Medicine Team phone at [phone] for questions and concerns.  For individual provider: See Shea Evans

## 2020-02-10 NOTE — Progress Notes (Signed)
eLink Physician-Brief Progress Note Patient Name: Robin Moreno DOB: 1925/04/17 MRN: [id number]   Date of Service  02/10/2020  HPI/Events of Note  INR = 2.8 - Patient not currently on Warfarin.   eICU Interventions  Plan: 1. Trend INR. Will repeat PT/INR in AM.     Intervention Category Major Interventions: Other:  Lenell Antu 02/10/2020, 11:24 PM

## 2020-02-10 NOTE — H&P (Addendum)
NAME:  Robin Moreno, MRN:  [id number], DOB:  10/01/1925, LOS: 0 ADMISSION DATE:  02/10/2020, CONSULTATION DATE:  02/10/2020 REFERRING MD:  Dr. Particia Nearing, CHIEF COMPLAINT:  Respiratory arrest    Brief History   86 year old female found down at home by family with ineffective respirations requiring bag mask ventilation.  Per report patient never lost pulses.  GCS 3 on admission  History of present illness   Robin Moreno is a 86 year old female with a past medical history significant for stage IIIa CKD, hypertension, hyperlipidemia, osteoarthritis, prior CVA, and recent COVID-19 pneumonia who presented to the emergency department today after being found down by family.  On arrival to ED patient was being assisted with bag mask ventilation with GCS of 3 requiring emergent intubation for airway protection.  Never lost pulses.  History obtained from chart review and verbal report as patient is currently encephalopathic and intubated.  On arrival patient was seen bradycardic, hypotensive, and severely hypoxic.  Of note patient was recently been treated at this facility for similar presentation with agonal breathing's and respiratory arrest on multiple occasions.  Most recent admission was 827 - 8/30 at which time patient evaluated by palliative care and decision was made to discharge home with outpatient palliative follow-up, son declined hospice care at that time. PCCM consulted for further management and admission.   Past Medical History  Stage 3a CKD HTN HLD DDD CVA Arthritis  Significant Hospital Events   11/14 > Admitted post resp arrest   Consults:    Procedures:  Intubated on arrival 11/14  Significant Diagnostic Tests:    Micro Data:  Blood culture 11/14 > Urine culture 11/14 >  Antimicrobials:  Ceftriaxone 11/14   Interim history/subjective:  Unresponsive on vent  Son distraught at beside   Objective   Blood pressure (!) 74/50, pulse (!) 59, resp. rate (!) 26, SpO2 (!)  36 %.    Vent Mode: PRVC FiO2 (%):  [100 %] 100 % Set Rate:  [18 bmp] 18 bmp Vt Set:  [470 mL] 470 mL PEEP:  [5 cmH20] 5 cmH20 Plateau Pressure:  [16 cmH20] 16 cmH20  No intake or output data in the 24 hours ending 02/10/20 1538 There were no vitals filed for this visit.  Examination: General: Chronically ill appearing very frail deconditioned elderly female lying in bed on mechanical ventilation, in NAD HEENT: ETT, MM pink/moist, PERRL,  Neuro: Unresponsive with no gag or corneal reflex  CV: s1s2 regular rate and rhythm, no murmur, rubs, or gallops,  PULM:  Mechanical breath sounds, no added breath sounds, not breathing over vent  GI: soft, bowel sounds active in all 4 quadrants, non-tender, non-distended Extremities: warm/dry, no edema  Skin: no rashes or lesions   Resolved Hospital Problem list     Assessment & Plan:  Respiratory arrest with profound hypoxia on arrival  -Per report patient never lost pulses  -Per son report it appears that patient was with agonal respirations since 8am this morning, high concern for possible anoxic injury  Circulatory shock P: Continue ventilator support with lung protective strategies  Wean PEEP and FiO2 for sats greater than 90%. Head of bed elevated 30 degrees. Plateau pressures less than 30 cm H20.  Follow intermittent chest x-ray and ABG.   Hold SBT until off NMB. Ensure adequate pulmonary hygiene  Follow cultures  VAP bundle in place  PAD protocol Continue pressor support for MAP goal > 65  At risk for anoxic encephalopathy. -Concern for prolonged inadequate respiration/oxygenation prior to  arrival  P: Hold on sedation to allow for neuro prognostication  RASS goal:0 to -1 Obtain EEG Likely will need neuro consult   Failure to thrive  -Long standing history of progressive decline P: See GOC discussion below   HX of stage 3a CKD per chart review  -Admission labwork pending, creatinine 0.87 on 01/02/2020 P: Follow renal  function / urine output Trend Bmet Avoid nephrotoxins, ensure adequate renal perfusion   Goals of care discussion  Patient has a significant failure to thrive and in so has suffered from several respiratory arrest over the last several weeks to months. Palliative care has been involved in the past with little interest in comfort care from family per report. We will continue to engage with family daily to determine plan of care moving forward.    Best practice:  Diet: NPO Pain/Anxiety/Delirium protocol (if indicated): As needed  VAP protocol (if indicated): In place  DVT prophylaxis: Subq heparin  GI prophylaxis: PPI Glucose control: SSI Mobility: Bedrest  Code Status: LCV, no chest compressions  Family Communication: Son updated at bedside  Disposition: ICU  Labs   CBC: No results for input(s): WBC, NEUTROABS, HGB, HCT, MCV, PLT in the last 168 hours.  Basic Metabolic Panel: No results for input(s): NA, K, CL, CO2, GLUCOSE, BUN, CREATININE, CALCIUM, MG, PHOS in the last 168 hours. GFR: CrCl cannot be calculated (Patient's most recent lab result is older than the maximum 21 days allowed.). No results for input(s): PROCALCITON, WBC, LATICACIDVEN in the last 168 hours.  Liver Function Tests: No results for input(s): AST, ALT, ALKPHOS, BILITOT, PROT, ALBUMIN in the last 168 hours. No results for input(s): LIPASE, AMYLASE in the last 168 hours. No results for input(s): AMMONIA in the last 168 hours.  ABG    Component Value Date/Time   PHART 7.386 11/17/2019 1822   PCO2ART 39.4 11/17/2019 1822   PO2ART 409 (H) 11/17/2019 1822   HCO3 26.7 01/02/2020 1811   TCO2 28 01/02/2020 1811   ACIDBASEDEF 1.0 11/17/2019 1822   O2SAT 99.0 01/02/2020 1811     Coagulation Profile: No results for input(s): INR, PROTIME in the last 168 hours.  Cardiac Enzymes: No results for input(s): CKTOTAL, CKMB, CKMBINDEX, TROPONINI in the last 168 hours.  HbA1C: Hgb A1c MFr Bld  Date/Time Value  Ref Range Status  11/18/2019 03:00 AM 5.6 4.8 - 5.6 % Final    Comment:    (NOTE) Pre diabetes:          5.7%-6.4%  Diabetes:              >6.4%  Glycemic control for   <7.0% adults with diabetes     CBG: Recent Labs  Lab 02/10/20 1444  GLUCAP 69*    Review of Systems:   Unable to assess   Past Medical History  She,  has a past medical history of Arthritis, CVA (cerebral vascular accident) ([facility]), Degenerative disk disease, Hypercholesterolemia, Hypertension, Osteoarthritis of right knee, Renal disorder, and Stage 3a chronic kidney disease ([facility]) (09/01/2019).   Surgical History    Past Surgical History:  Procedure Laterality Date  . ABDOMINAL HYSTERECTOMY    . TOTAL HIP ARTHROPLASTY       Social History   reports that she has never smoked. She has never used smokeless tobacco. She reports that she does not drink alcohol and does not use drugs.   Family History   Her family history is not on file.   Allergies No Known Allergies  Home Medications  Prior to Admission medications   Medication Sig Start Date End Date Taking? Authorizing Provider  acetaminophen (TYLENOL) 325 MG tablet Take 2 tablets (650 mg total) by mouth every 6 (six) hours as needed. Patient not taking: Reported on 01/02/2020 01/04/19   Derwood Kaplan, MD  albuterol (VENTOLIN HFA) 108 (90 Base) MCG/ACT inhaler Inhale 1-2 puffs into the lungs every 4 (four) hours as needed for wheezing or shortness of breath.     [provider]  apixaban (ELIQUIS) 5 MG TABS tablet Please take 10 mg oral twice daily for next 5 days, then transition to 5 mg oral twice daily from 11/28/2019 Patient taking differently: Take 5 mg by mouth 2 (two) times daily.  11/22/19   Elgergawy, Leana Roe, MD  Ascorbic Acid (VITAMIN C PO) Take 1 tablet by mouth daily.    [provider]  baclofen (LIORESAL) 10 MG tablet Take 10 mg by mouth 2 (two) times daily. 11/16/19   [provider]  Cholecalciferol (VITAMIN D)  2000 UNITS tablet Take 2,000 Units by mouth daily.      [provider]  fluticasone (FLONASE) 50 MCG/ACT nasal spray Place 1 spray into both nostrils daily as needed for allergies or rhinitis.  11/04/19   [provider]  gabapentin (NEURONTIN) 100 MG capsule Take 1 capsule (100 mg total) by mouth 3 (three) times daily. 09/02/19   Burnadette Pop, MD  meclizine (ANTIVERT) 12.5 MG tablet Take 1 tablet (12.5 mg total) by mouth 3 (three) times daily as needed for dizziness. 07/23/18   Pricilla Loveless, MD  metoprolol tartrate (LOPRESSOR) 25 MG tablet Take 1 tablet (25 mg total) by mouth 2 (two) times daily. 11/22/19   Elgergawy, Leana Roe, MD  Multiple Vitamins-Minerals (ZINC PO) Take 1 tablet by mouth daily.    [provider]  pantoprazole (PROTONIX) 40 MG tablet Take 1 tablet (40 mg total) by mouth daily. 11/23/19   Elgergawy, Leana Roe, MD  Polyethyl Glycol-Propyl Glycol (SYSTANE OP) Place 1 drop into both eyes every morning.    [provider]  rosuvastatin (CRESTOR) 10 MG tablet Take 10 mg by mouth daily. 09/19/19   [provider]     Critical care time:   CRITICAL CARE Performed by: Delfin Gant  Total critical care time: 40 minutes  Critical care time was exclusive of separately billable procedures and treating other patients.  Critical care was necessary to treat or prevent imminent or life-threatening deterioration.  Critical care was time spent personally by me on the following activities: development of treatment plan with patient and/or surrogate as well as nursing, discussions with consultants, evaluation of patient's response to treatment, examination of patient, obtaining history from patient or surrogate, ordering and performing treatments and interventions, ordering and review of laboratory studies, ordering and review of radiographic studies, pulse oximetry and re-evaluation of patient's condition.  Delfin Gant, NP-C  Pulmonary  & Critical Care Contact / Pager information can be found on Amion  02/10/2020, 4:17 PM    PCCM:  86 yo fm, PMH ckd, htn, prior CVA, dementia, COVID, multiple prior respiratory arrests. She was found agonal respirations by son this morning. (this is based on his description of the respiratory pattern). He thought she was just sleeping. He went back to check on her this afternoon and she was still unresponsive. He called EMS. She was home on hospice care already and a DNR. She was being bagged with sats in 30s in ED. She was  intubated in the ER after son reversed code status.   BP 120/66 (BP Location: Right Arm)   Pulse (!) 114   Temp (!) 96.8 F (36 C) (Temporal)   Resp 18   SpO2 (!) 39%   Gen: elderly FM, intubated on life support HENT: ETT in place Heart: RRR, s1 s2  Lungs: BL vented breaths  Abd: soft nt nd . Neuro: eye fluttering, breathing over ventilator   Labs reviewed   A:  Shock  Multifactorial, sepsis, aspiration  Acute metabolic encephalopathy secondary to above  Acute hypoxemic respiratory failure  Likely component anoxic brain injury  Prior Hospice patient  Failure to thrive at baseline  CKD 3a baseline  Severe metabolic acidosis  P: Labs pending  Head CT pending  abx started  precedex for sedation if needed Consult to palliative care GOC discussion, please see separate documentation, separate time spent NEPI and Vasopressin for MAP >42mmHg  Bicarbonate infusion   This patient is critically ill with multiple organ system failure; which, requires frequent high complexity decision making, assessment, support, evaluation, and titration of therapies. This was completed through the application of advanced monitoring technologies and extensive interpretation of multiple databases. During this encounter critical care time was devoted to patient care services described in this note for 32 minutes.  Josephine Igo, DO [facility] 02/10/2020  4:36 PM

## 2020-02-10 NOTE — Progress Notes (Signed)
eLink Physician-Brief Progress Note Patient Name: Robin Moreno DOB: 02/15/1926 MRN: [id number]   Date of Service  02/10/2020  HPI/Events of Note  Troponin = 27.   eICU Interventions  Will continue to trend Troponin.      Intervention Category Major Interventions: Other:  Lenell Antu 02/10/2020, 10:12 PM

## 2020-02-10 NOTE — Progress Notes (Signed)
   02/10/20 1530  Clinical Encounter Type  Visited With Patient and family together  Visit Type Patient actively dying  Referral From Nurse  Consult/Referral To Chaplain  Chaplain provided presence, comfort and prayer with son. This note was prepared by Deneen Harts, M.Div..  For questions please contact by phone [phone].

## 2020-02-10 NOTE — ED Triage Notes (Signed)
Pt arrived from home after being found down , pt being bagged on arrival , never lost pulses , gcs of 3 on arrival  Pt received 500 of fluid for a s b/p of 50

## 2020-02-10 NOTE — ED Provider Notes (Signed)
[facility] EMERGENCY DEPARTMENT Provider Note   CSN: [id number] Arrival date & time: 02/10/20  1428     History No chief complaint on file.   Robin Moreno is a 86 y.o. female.  Pt presents to the ED today with AMS.  Pt was last seen by her son yesterday.  She was found unresponsive by him this afternoon.  EMS was called.  When EMS arrived, her GCS was 3 and her bp was in the 60s.  EMS started an IO and she was given 500 cc NS en route.  She was bagged en route.  Pt unable to give any hx.  Per son, she complained of shoulder pain last night.  Pt had a recent admission for Covid pneumonia.  During this admission, pt was made palliative care.  Son had wanted dnr/dni.  Now, the son wants everything done.         Past Medical History:  Diagnosis Date  . Arthritis   . CVA (cerebral vascular accident) (HCC)   . Degenerative disk disease   . Hypercholesterolemia   . Hypertension   . Osteoarthritis of right knee   . Renal disorder   . Stage 3a chronic kidney disease (HCC) 09/01/2019    Patient Active Problem List   Diagnosis Date Noted  . Failure to thrive in adult 11/24/2019  . Palliative care by specialist   . Goals of care, counseling/discussion   . Advanced directives, counseling/discussion   . DNI (do not intubate)   . Concern about end of life   . Encounter for hospice care discussion   . Somnolence 11/23/2019  . Pulmonary embolism on right (HCC) 11/20/2019  . Pressure injury of skin 11/18/2019  . Respiratory distress 11/17/2019  . Altered mental status   . Respiratory arrest (HCC)   . Facial twitching 09/01/2019  . Stage 3a chronic kidney disease (HCC) 09/01/2019  . DNR (do not resuscitate) 09/01/2019  . COVID-19 07/30/2019  . Essential hypertension 07/30/2019  . Dyslipidemia 07/30/2019  . Pre-syncope 07/30/2019  . Viral gastroenteritis due to [place] agent 09/19/2018  . Acute kidney injury superimposed on CKD (HCC) 09/18/2018  . Diarrhea  09/18/2018  . Urinary tract infection 09/18/2018  . Generalized weakness 09/18/2018  . Arthritis 09/18/2018    Past Surgical History:  Procedure Laterality Date  . ABDOMINAL HYSTERECTOMY    . TOTAL HIP ARTHROPLASTY       OB History   No obstetric history on file.     No family history on file.  Social History   Tobacco Use  . Smoking status: Never Smoker  . Smokeless tobacco: Never Used  Vaping Use  . Vaping Use: Never used  Substance Use Topics  . Alcohol use: No  . Drug use: No    Home Medications Prior to Admission medications   Medication Sig Start Date End Date Taking? Authorizing Provider  acetaminophen (TYLENOL) 325 MG tablet Take 2 tablets (650 mg total) by mouth every 6 (six) hours as needed. Patient not taking: Reported on 01/02/2020 01/04/19   Derwood Kaplan, MD  albuterol (VENTOLIN HFA) 108 (90 Base) MCG/ACT inhaler Inhale 1-2 puffs into the lungs every 4 (four) hours as needed for wheezing or shortness of breath.     [provider]  apixaban (ELIQUIS) 5 MG TABS tablet Please take 10 mg oral twice daily for next 5 days, then transition to 5 mg oral twice daily from 11/28/2019 Patient taking differently: Take 5 mg by mouth 2 (two)  times daily.  11/22/19   Elgergawy, Leana Roe, MD  Ascorbic Acid (VITAMIN C PO) Take 1 tablet by mouth daily.    [provider]  baclofen (LIORESAL) 10 MG tablet Take 10 mg by mouth 2 (two) times daily. 11/16/19   [provider]  Cholecalciferol (VITAMIN D) 2000 UNITS tablet Take 2,000 Units by mouth daily.      [provider]  diltiazem (CARDIZEM) 60 MG tablet Take 60 mg by mouth 2 (two) times daily. 01/24/20   [provider]  fluticasone (FLONASE) 50 MCG/ACT nasal spray Place 1 spray into both nostrils daily as needed for allergies or rhinitis.  11/04/19   [provider]  gabapentin (NEURONTIN) 100 MG capsule Take 1 capsule (100 mg total) by mouth 3 (three) times daily. 09/02/19    Burnadette Pop, MD  meclizine (ANTIVERT) 12.5 MG tablet Take 1 tablet (12.5 mg total) by mouth 3 (three) times daily as needed for dizziness. 07/23/18   Pricilla Loveless, MD  metoprolol tartrate (LOPRESSOR) 25 MG tablet Take 1 tablet (25 mg total) by mouth 2 (two) times daily. 11/22/19   Elgergawy, Leana Roe, MD  Multiple Vitamins-Minerals (ZINC PO) Take 1 tablet by mouth daily.    [provider]  pantoprazole (PROTONIX) 40 MG tablet Take 1 tablet (40 mg total) by mouth daily. 11/23/19   Elgergawy, Leana Roe, MD  PHENobarbital (LUMINAL) 64.8 MG tablet Take 64.8 mg by mouth every 12 (twelve) hours as needed. 01/08/20   [provider]  Polyethyl Glycol-Propyl Glycol (SYSTANE OP) Place 1 drop into both eyes every morning.    [provider]  rosuvastatin (CRESTOR) 10 MG tablet Take 10 mg by mouth daily. 09/19/19   [provider]    Allergies    Patient has no known allergies.  Review of Systems   Review of Systems  Unable to perform ROS: Patient unresponsive    Physical Exam Updated Vital Signs BP 120/66 (BP Location: Right Arm)   Pulse (!) 114   Temp (!) 96.8 F (36 C) (Temporal)   Resp 18   SpO2 (!) 39%   Physical Exam Vitals and nursing note reviewed.  Constitutional:      General: She is in acute distress.  HENT:     Head: Normocephalic and atraumatic.     Right Ear: External ear normal.     Left Ear: External ear normal.     Nose: Nose normal.     Mouth/Throat:     Mouth: Mucous membranes are dry.  Eyes:     Conjunctiva/sclera: Conjunctivae normal.  Cardiovascular:     Rate and Rhythm: Tachycardia present.     Pulses: Normal pulses.     Heart sounds: Normal heart sounds.  Pulmonary:     Comments: No spontaneous resp Abdominal:     General: Abdomen is flat. Bowel sounds are normal.     Palpations: Abdomen is soft.  Musculoskeletal:     Cervical back: Normal range of motion.     Comments: No obvious deformities  Neurological:      Mental Status: She is unresponsive.     ED Results / Procedures / Treatments   Labs (all labs ordered are listed, but only abnormal results are displayed) Labs Reviewed  CBG MONITORING, ED - Abnormal; Notable for the following components:      Result Value   Glucose-Capillary 69 (*)    All other components within normal limits  I-STAT ARTERIAL BLOOD GAS, ED - Abnormal; Notable for  the following components:   pH, Arterial 7.153 (*)    pCO2 arterial 24.9 (*)    pO2, Arterial 211 (*)    Bicarbonate 8.7 (*)    TCO2 9 (*)    Acid-base deficit 19.0 (*)    Sodium 147 (*)    Calcium, Ion 1.06 (*)    HCT 27.0 (*)    Hemoglobin 9.2 (*)    All other components within normal limits  I-STAT CHEM 8, ED - Abnormal; Notable for the following components:   Sodium 151 (*)    Potassium 2.9 (*)    Chloride 119 (*)    Glucose, Bld 139 (*)    Calcium, Ion 0.82 (*)    TCO2 10 (*)    Hemoglobin 8.8 (*)    HCT 26.0 (*)    All other components within normal limits  CULTURE, BLOOD (ROUTINE X 2)  CULTURE, BLOOD (ROUTINE X 2)  URINE CULTURE  LACTIC ACID, PLASMA  LACTIC ACID, PLASMA  COMPREHENSIVE METABOLIC PANEL  CBC WITH DIFFERENTIAL/PLATELET  PROTIME-INR  APTT  URINALYSIS, ROUTINE W REFLEX MICROSCOPIC  CBC  TROPONIN I (HIGH SENSITIVITY)   ABG obtained as Epic was down PH: 7.15 PaCO2:24.9 PaO2: 211 BE: -19 HCO3: 8.7 TCO2: 9 SaO2: 100%  EKG EKG Interpretation  Date/Time:  Sunday February 10 2020 14:43:35 EST Ventricular Rate:  115 PR Interval:    QRS Duration: 93 QT Interval:  337 QTC Calculation: 467 R Axis:   51 Text Interpretation: Junctional tachycardia Low voltage, extremity leads Repol abnrm suggests ischemia, anterolateral Confirmed by Jacalyn Lefevre [id number]) on 02/10/2020 4:25:05 PM   Radiology No results found.  Procedures Date/Time: 02/10/2020 4:12 PM Performed by: Jacalyn Lefevre, MD Oxygen Delivery Method: Ambu bag Preoxygenation: Pre-oxygenation with 100%  oxygen Induction Type: Rapid sequence Laryngoscope Size: Glidescope and 3 Tube size: 7.5 mm Number of attempts: 1 Secured at: 23 cm Tube secured with: ETT holder Dental Injury: Teeth and Oropharynx as per pre-operative assessment     .Central Line  Date/Time: 02/10/2020 4:12 PM Performed by: Jacalyn Lefevre, MD Authorized by: Jacalyn Lefevre, MD   Consent:    Consent obtained:  Emergent situation Pre-procedure details:    Skin preparation:  2% chlorhexidine   Skin preparation agent: Skin preparation agent completely dried prior to procedure   Procedure details:    Location:  R femoral   Procedural supplies:  Triple lumen   Ultrasound guidance: no     Number of attempts:  3   Successful placement: yes   Post-procedure details:    Post-procedure:  Dressing applied and line sutured   Assessment:  Blood return through all ports   Patient tolerance of procedure:  Tolerated well, no immediate complications Comments:     I attempted a right IJ and was able to cannulate the vein.  Unfortunately, the guide wire would not thread.   (including critical care time)  Medications Ordered in ED Medications  fentaNYL (SUBLIMAZE) injection 50 mcg (has no administration in time range)  fentaNYL (SUBLIMAZE) injection 50 mcg (has no administration in time range)  midazolam (VERSED) injection 1 mg (has no administration in time range)  midazolam (VERSED) injection 1 mg (has no administration in time range)  EPINEPHrine 0.1 MG/10ML injection (has no administration in time range)  norepinephrine (LEVOPHED) 4mg  in premix infusion (20 mcg/min Intravenous Rate/Dose Change 02/10/20 1618)  lactated ringers infusion (has no administration in time range)  lactated ringers bolus 1,000 mL (1,000 mLs Intravenous New Bag/Given 02/10/20 1556)  And  lactated ringers bolus 500 mL (has no administration in time range)    And  lactated ringers bolus 250 mL (has no administration in time range)   vasopressin (PITRESSIN) 20 Units in sodium chloride 0.9 % 100 mL infusion-*FOR SHOCK* (0.03 Units/min Intravenous New Bag/Given 02/10/20 1549)  cefTRIAXone (ROCEPHIN) 1 g in sodium chloride 0.9 % 100 mL IVPB (has no administration in time range)  sodium bicarbonate 150 mEq in dextrose 5 % 1,000 mL infusion (has no administration in time range)  docusate sodium (COLACE) capsule 100 mg (has no administration in time range)  polyethylene glycol (MIRALAX / GLYCOLAX) packet 17 g (has no administration in time range)  heparin injection 5,000 Units (has no administration in time range)  ondansetron (ZOFRAN) injection 4 mg (has no administration in time range)  sodium bicarbonate 1 mEq/mL injection (has no administration in time range)  EPINEPhrine 10 mcg/mL Adult IV Push Syringe (For Blood Pressure Support) (20 mcg Intravenous Given 02/10/20 1437)  sodium bicarbonate injection 50 mEq (50 mEq Intravenous Given 02/10/20 1557)    ED Course  I have reviewed the triage vital signs and the nursing notes.  Pertinent labs & imaging results that were available during my care of the patient were reviewed by me and considered in my medical decision making (see chart for details).    MDM Rules/Calculators/A&P                          Pt was intubated upon arrival.  BP is very low.  It was difficult to obtain peripheral IV access.  The nurse was able to get an IV in her foot.  She was started on levophed until I could get a central line.  She was given push epi.  I was able to place a central line in her right femoral vein.  Levophed moved to the tlc.  Pt still remains hypotensive, so she was started on vasopressin.  Of note, there are low O2 sats documented.  These are not accurate.  I think they were reading low due to a bad reading.   ABG shows oxygenation to be ok.  I spoke with son to try to get him to reconsider full code.  He wants everything done.     I spoke with critical care who will admit.  Labs  are pending at admission.  CRITICAL CARE Performed by: Jacalyn Lefevre   Total critical care time: 75 minutes  Critical care time was exclusive of separately billable procedures and treating other patients.  Critical care was necessary to treat or prevent imminent or life-threatening deterioration.  Critical care was time spent personally by me on the following activities: development of treatment plan with patient and/or surrogate as well as nursing, discussions with consultants, evaluation of patient's response to treatment, examination of patient, obtaining history from patient or surrogate, ordering and performing treatments and interventions, ordering and review of laboratory studies, ordering and review of radiographic studies, pulse oximetry and re-evaluation of patient's condition.     Final Clinical Impression(s) / ED Diagnoses Final diagnoses:  Acute respiratory failure with hypoxia (HCC)  Hypotension, unspecified hypotension type    Rx / DC Orders ED Discharge Orders    None       Jacalyn Lefevre, MD 02/10/20 1635

## 2020-02-11 ENCOUNTER — Other Ambulatory Visit (HOSPITAL_COMMUNITY)

## 2020-02-11 ENCOUNTER — Inpatient Hospital Stay (HOSPITAL_COMMUNITY)

## 2020-02-11 DIAGNOSIS — R4182 Altered mental status, unspecified: Secondary | ICD-10-CM

## 2020-02-11 DIAGNOSIS — J9601 Acute respiratory failure with hypoxia: Secondary | ICD-10-CM | POA: Diagnosis not present

## 2020-02-11 LAB — CBC
HCT: 31.4 % — ABNORMAL LOW (ref 36.0–46.0)
Hemoglobin: 9.2 g/dL — ABNORMAL LOW (ref 12.0–15.0)
MCH: 28.7 pg (ref 26.0–34.0)
MCHC: 29.3 g/dL — ABNORMAL LOW (ref 30.0–36.0)
MCV: 97.8 fL (ref 80.0–100.0)
Platelets: 155 10*3/uL (ref 150–400)
RBC: 3.21 MIL/uL — ABNORMAL LOW (ref 3.87–5.11)
RDW: 15.5 % (ref 11.5–15.5)
WBC: 27.7 10*3/uL — ABNORMAL HIGH (ref 4.0–10.5)
nRBC: 0 % (ref 0.0–0.2)

## 2020-02-11 LAB — BASIC METABOLIC PANEL
Anion gap: 14 (ref 5–15)
BUN: 19 mg/dL (ref 8–23)
CO2: 14 mmol/L — ABNORMAL LOW (ref 22–32)
Calcium: 6.7 mg/dL — ABNORMAL LOW (ref 8.9–10.3)
Chloride: 115 mmol/L — ABNORMAL HIGH (ref 98–111)
Creatinine, Ser: 1.55 mg/dL — ABNORMAL HIGH (ref 0.44–1.00)
GFR, Estimated: 31 mL/min — ABNORMAL LOW (ref >60)
Glucose, Bld: 228 mg/dL — ABNORMAL HIGH (ref 70–99)
Potassium: 4 mmol/L (ref 3.5–5.1)
Sodium: 143 mmol/L (ref 135–145)

## 2020-02-11 LAB — BLOOD CULTURE ID PANEL (REFLEXED) - BCID2

## 2020-02-11 LAB — GLUCOSE, CAPILLARY
Glucose-Capillary: 242 mg/dL — ABNORMAL HIGH (ref 70–99)
Glucose-Capillary: 136 mg/dL — ABNORMAL HIGH (ref 70–99)
Glucose-Capillary: 237 mg/dL — ABNORMAL HIGH (ref 70–99)
Glucose-Capillary: 205 mg/dL — ABNORMAL HIGH (ref 70–99)
Glucose-Capillary: 167 mg/dL — ABNORMAL HIGH (ref 70–99)
Glucose-Capillary: 146 mg/dL — ABNORMAL HIGH (ref 70–99)

## 2020-02-11 LAB — HEPATIC FUNCTION PANEL
ALT: 97 U/L — ABNORMAL HIGH (ref 0–44)
AST: 155 U/L — ABNORMAL HIGH (ref 15–41)
Albumin: 1.6 g/dL — ABNORMAL LOW (ref 3.5–5.0)
Alkaline Phosphatase: 76 U/L (ref 38–126)
Bilirubin, Direct: 0.4 mg/dL — ABNORMAL HIGH (ref 0.0–0.2)
Indirect Bilirubin: 0.7 mg/dL (ref 0.3–0.9)
Total Bilirubin: 1.1 mg/dL (ref 0.3–1.2)
Total Protein: 4.5 g/dL — ABNORMAL LOW (ref 6.5–8.1)

## 2020-02-11 LAB — TYPE AND SCREEN
ABO/RH(D): O POS
Antibody Screen: NEGATIVE

## 2020-02-11 LAB — LACTIC ACID, PLASMA
Lactic Acid, Venous: 10.4 mmol/L (ref 0.5–1.9)
Lactic Acid, Venous: 10.5 mmol/L (ref 0.5–1.9)
Lactic Acid, Venous: >11 mmol/L (ref 0.5–1.9)

## 2020-02-11 LAB — TROPONIN I (HIGH SENSITIVITY): Troponin I (High Sensitivity): 79 ng/L — ABNORMAL HIGH (ref <18)

## 2020-02-11 LAB — PROTIME-INR
INR: 5.3 (ref 0.8–1.2)
Prothrombin Time: 47.4 seconds — ABNORMAL HIGH (ref 11.4–15.2)

## 2020-02-11 LAB — MAGNESIUM
Magnesium: 1.5 mg/dL — ABNORMAL LOW (ref 1.7–2.4)
Magnesium: 1.9 mg/dL (ref 1.7–2.4)

## 2020-02-11 LAB — PHOSPHORUS: Phosphorus: 3.4 mg/dL (ref 2.5–4.6)

## 2020-02-11 LAB — ABO/RH: ABO/RH(D): O POS

## 2020-02-11 MED ORDER — VANCOMYCIN HCL 1250 MG/250ML IV SOLN
1250.0000 mg | Freq: Once | INTRAVENOUS | Status: AC
  Administered 2020-02-11: 1250 mg via INTRAVENOUS
  Filled 2020-02-11: qty 250

## 2020-02-11 MED ORDER — SENNOSIDES-DOCUSATE SODIUM 8.6-50 MG PO TABS
1.0000 | ORAL_TABLET | Freq: Every day | ORAL | Status: DC
  Administered 2020-02-11: 1 via ORAL
  Filled 2020-02-11: qty 1

## 2020-02-11 MED ORDER — INSULIN ASPART 100 UNIT/ML ~~LOC~~ SOLN
0.0000 [IU] | SUBCUTANEOUS | Status: DC
  Administered 2020-02-11: 2 [IU] via SUBCUTANEOUS
  Administered 2020-02-11 (×2): 3 [IU] via SUBCUTANEOUS
  Administered 2020-02-12: 1 [IU] via SUBCUTANEOUS
  Administered 2020-02-12 (×2): 2 [IU] via SUBCUTANEOUS
  Administered 2020-02-13: 0 [IU] via SUBCUTANEOUS

## 2020-02-11 MED ORDER — MAGNESIUM SULFATE 2 GM/50ML IV SOLN
2.0000 g | Freq: Once | INTRAVENOUS | Status: AC
  Administered 2020-02-11: 2 g via INTRAVENOUS
  Filled 2020-02-11: qty 50

## 2020-02-11 MED ORDER — MAGNESIUM SULFATE 4 GM/100ML IV SOLN
4.0000 g | Freq: Once | INTRAVENOUS | Status: DC
  Filled 2020-02-11: qty 100

## 2020-02-11 MED ORDER — SODIUM CHLORIDE 0.9% IV SOLUTION: Freq: Once | INTRAVENOUS | Status: AC

## 2020-02-11 MED ORDER — HEPARIN (PORCINE) 25000 UT/250ML-% IV SOLN
600.0000 [IU]/h | INTRAVENOUS | Status: DC
  Administered 2020-02-11: 600 [IU]/h via INTRAVENOUS
  Filled 2020-02-11: qty 250

## 2020-02-11 MED ORDER — ASPIRIN 81 MG PO CHEW
81.0000 mg | CHEWABLE_TABLET | Freq: Every day | ORAL | Status: DC
  Administered 2020-02-11 – 2020-02-13 (×3): 81 mg
  Filled 2020-02-11 (×3): qty 1

## 2020-02-11 MED ORDER — DOCUSATE SODIUM 50 MG/5ML PO LIQD: 100.0000 mg | Freq: Two times a day (BID) | ORAL | Status: DC | PRN

## 2020-02-11 MED ORDER — MAGNESIUM SULFATE 2 GM/50ML IV SOLN: 2.0000 g | Freq: Once | INTRAVENOUS | Status: DC

## 2020-02-11 MED ORDER — SODIUM CHLORIDE 0.9 % IV SOLN
2.0000 g | INTRAVENOUS | Status: DC
  Administered 2020-02-11 – 2020-02-13 (×3): 2 g via INTRAVENOUS
  Filled 2020-02-11 (×3): qty 20

## 2020-02-11 MED ORDER — SODIUM CHLORIDE 0.9 % IV BOLUS
1000.0000 mL | Freq: Once | INTRAVENOUS | Status: AC
  Administered 2020-02-11: 1000 mL via INTRAVENOUS

## 2020-02-11 MED ORDER — POLYETHYLENE GLYCOL 3350 17 G PO PACK
17.0000 g | PACK | Freq: Every day | ORAL | Status: DC
  Administered 2020-02-11 – 2020-02-13 (×2): 17 g
  Filled 2020-02-11 (×2): qty 1

## 2020-02-11 MED ORDER — NOREPINEPHRINE 16 MG/250ML-% IV SOLN
0.0000 ug/min | INTRAVENOUS | Status: DC
  Administered 2020-02-11: 30 ug/min via INTRAVENOUS
  Administered 2020-02-11: 7 ug/min via INTRAVENOUS
  Filled 2020-02-11 (×3): qty 250

## 2020-02-11 MED ORDER — CALCIUM GLUCONATE-NACL 1-0.675 GM/50ML-% IV SOLN
1.0000 g | Freq: Once | INTRAVENOUS | Status: AC
  Administered 2020-02-11: 1000 mg via INTRAVENOUS
  Filled 2020-02-11 (×2): qty 50

## 2020-02-11 MED ORDER — VANCOMYCIN HCL 750 MG/150ML IV SOLN
750.0000 mg | INTRAVENOUS | Status: DC
  Filled 2020-02-11: qty 150

## 2020-02-11 MED ORDER — SENNOSIDES-DOCUSATE SODIUM 8.6-50 MG PO TABS
1.0000 | ORAL_TABLET | Freq: Every day | ORAL | Status: DC
  Administered 2020-02-12: 1
  Filled 2020-02-11: qty 1

## 2020-02-11 NOTE — Consult Note (Signed)
WOC Nurse Consult Note: Patient receiving care in [facility] 2M10. Intubated and sedated.  Reason for Consult: Sacral wound Wound type: Unstageable Pressure Injury POA: Yes Measurement:2.5 cm x 1 cm  Wound bed: 25% red and pink, friable, 75% yellow Drainage (amount, consistency, odor) Serosanguinous on foam dressing.  Periwound: Intact Dressing procedure/placement/frequency:Apply Aquacel Advantage [place] # [phone]) to just inside the wound and cover with foam dressing. Change daily or PRN soiling.   Monitor the wound area(s) for worsening of condition such as: Signs/symptoms of infection, increase in size, development of or worsening of odor, development of pain, or increased pain at the affected locations.   Notify the medical team if any of these develop.  Thank you for the consult. WOC nurse will not follow at this time.   Please re-consult the WOC team if needed.  Renaldo Reel Katrinka Blazing, MSN, RN, CMSRN, Angus Seller, [facility] Wound Treatment Associate Pager [phone]

## 2020-02-11 NOTE — Procedures (Signed)
Patient Name: ASHLEY BULTEMA  MRN: [id number]  Epilepsy Attending: Charlsie Quest  Referring Physician/Provider: Raymon Mutton, NP Date: 02/11/2020 Duration:  24.60 mins  Patient history: 86 year old female with altered mental status in setting of multiorgan failure.  EEG to evaluate for seizures.  Level of alertness: lethargic  AEDs during EEG study: None  Technical aspects: This EEG study was done with scalp electrodes positioned according to the 10-20 International system of electrode placement. Electrical activity was acquired at a sampling rate of 500Hz  and reviewed with a high frequency filter of 70Hz  and a low frequency filter of 1Hz . EEG data were recorded continuously and digitally stored.   Description: No clear posterior dominant rhythm was seen.  EEG showed  generalized 8 to 9 Hz alpha activity alternating with low amplitude 2 to 3 Hz delta activity as well as generalized EEG attenuation lasting 2 to 3 seconds.   Hyperventilation and photic stimulation were not performed.     ABNORMALITY -Continuous slow, generalized  IMPRESSION: This study is suggestive of moderate to severe diffuse encephalopathy, nonspecific etiology. No seizures or epileptiform discharges were seen throughout the recording.  Dublin Cantero 

## 2020-02-11 NOTE — Progress Notes (Addendum)
PCCM Attending Note 02/11/2020  I saw and evaluated the patient. Discussed with resident and agree with resident's findings and plan as documented in the resident's note.  I have seen and evaluated the patient for respiratory failure.  S:  Continues to do poorly. Lactate 10, not clearing On pressors  O: Blood pressure 129/81, pulse (!) 217, temperature 98.6 F (37 C), temperature source Axillary, resp. rate (!) 34, height 5\' 6"  (1.676 m), weight 54 kg, SpO2 92 %.  Comatose elderly woman tachypneic on vent Unresponsive to sternal rub Ext lukewarm  A:  Elderly woman with septic shock in multiorgan failure (CNS, heart, kidney, lung, bone marrow) approaching end of life.  Inciting event unclear but aspiration is reasonable supposition.  P:  - Continue vent support, ceftriaxone - No further escalation of pressors - 48 trial of support per palliative discussion with son 11/14 - Continue to engage son as patient appears to be suffering through her end of life process   Patient critically ill due to septic shock with multiorgan failure Interventions to address this today mechanical ventilation titration, family discussions, pressor adjustments Risk of deterioration without these interventions is high  I personally spent 39 minutes providing critical care not including any separately billable procedures  Myrla Halsted MD [facility] 02/11/2020 9:23 AM Personal pager: [phone] If unanswered, please page CCM On-call: #[phone]    02/11/2020 Myrla Halsted MD     NAME:  KAMRON MARKLIN, MRN:  [id number], DOB:  Dec 19, 1925, LOS: 1 ADMISSION DATE:  02/10/2020, CONSULTATION DATE:  02/10/2020 REFERRING MD:  Dr. Particia Nearing, CHIEF COMPLAINT:  Respiratory arrest    Brief History   86 year old female found down at home by family with ineffective respirations requiring bag mask ventilation.  Per report patient never lost pulses.  GCS 3 on admission  History of present  illness   Robin Moreno is a 86 year old female with a past medical history significant for stage IIIa CKD, hypertension, hyperlipidemia, osteoarthritis, prior CVA, and recent COVID-19 pneumonia who presented to the emergency department today after being found down by family.  On arrival to ED patient was being assisted with bag mask ventilation with GCS of 3 requiring emergent intubation for airway protection.  Never lost pulses.  History obtained from chart review and verbal report as patient is currently encephalopathic and intubated.  On arrival patient was seen bradycardic, hypotensive, and severely hypoxic.  Of note patient was recently been treated at this facility for similar presentation with agonal breathing's and respiratory arrest on multiple occasions.  Most recent admission was 827 - 8/30 at which time patient evaluated by palliative care and decision was made to discharge home with outpatient palliative follow-up, son declined hospice care at that time. PCCM consulted for further management and admission.   Past Medical History  Stage 3a CKD HTN HLD DDD CVA Arthritis  Significant Hospital Events   11/14 > Admitted post resp arrest   Consults:  Palliative  Procedures:  Intubated on arrival 11/14  Significant Diagnostic Tests:    Micro Data:  Blood culture 11/14 > Urine culture 11/14 >  Antimicrobials:  Ceftriaxone 11/14   Interim history/subjective:  Unresponsive on vent. Withdraws to pain LE bilaterally. Son at bedside, discussed severity of her condition and the multiple issues contributing to her illness and limiting treatment. He understands that she will not come home from the hospital but w  ould like to give her a little more time.   Objective   Blood pressure 93/60, pulse Marland Kitchen)  117, temperature 98.6 F (37 C), temperature source Axillary, resp. rate (!) 29, height 5\' 6"  (1.676 m), weight 54 kg, SpO2 100 %.    Vent Mode: PRVC FiO2 (%):  [40 %-100 %] 40  % Set Rate:  [18 bmp] 18 bmp Vt Set:  [470 mL] 470 mL PEEP:  [5 cmH20] 5 cmH20 Plateau Pressure:  [14 cmH20-20 cmH20] 20 cmH20   Intake/Output Summary (Last 24 hours) at 02/11/2020 3086 Last data filed at 02/11/2020 0645 Gross per 24 hour  Intake 4588.7 ml  Output 235 ml  Net 4353.7 ml   Filed Weights   02/10/20 1600  Weight: 54 kg    Examination: General: Chronically ill appearing very frail deconditioned elderly female lying in bed on mechanical ventilation, in NAD HEENT: ETT, MM pink/moist, PERRL,  Neuro: Unresponsive with no gag or corneal reflex, withdraws to LE painful stimuli  CV: s1s2 regular rate and rhythm, no murmur, rubs, or gallops,  PULM:  Mechanical breath sounds, no added breath sounds, breathing over vent  GI: soft, bowel sounds active in all 4 quadrants, non-tender, non-distended Extremities: warm/dry, no LE edema Skin: no rashes or lesions   Resolved Hospital Problem list     Assessment & Plan:  Intubated s/p Respiratory arrest with profound hypoxia on arrival  At risk for anoxic encephalopathy. -Per report patient never lost pulses, had agonal breathing for several hours, high concern for possible anoxic injury   Worsening shock with elevated trops 2/2 demand and unchanged lactic acid despite fluids. Son wants to continue partial code at this time but is in the process of understanding how how ill she is.  P: Continue ventilator support with lung protective strategies  Wean PEEP and FiO2 for sats greater than 90%. Head of bed elevated 30 degrees. Plateau pressures less than 30 cm H20.  Follow intermittent chest x-ray and ABG.   Ensure adequate pulmonary hygiene  Follow cultures  VAP bundle in place  PAD protocol EEG pending  RASS goal 0 to -1  Septic shock with staph spp and strep bacteremia   Cont. Ceftriaxone 2g Palliative care consulted, appreciate assistance  Trending LA Continue pressor support for MAP goal > 65 - on levo/vasopressin   Continue bicarb gtt Cont. Stress dose steroids Cont 150cc/hr LR  Lateral Ischemia Likely demand, troponin mildly elevated. Anticoagulated with INR 5.3 Coagulopathy INR initially elevated, possibly unreliable and effected by home eliquis initially, now 5.3. Platelets currently normal but concern for progression.  -s/p 2U FFP -SCDs  -cont. Asa 81 mg -corrected Ca 8, repleting.   Failure to thrive  -Long standing history of progressive decline P: See GOC discussion below   HX of stage 3a CKD per chart review  -Admission labwork pending, creatinine 0.87 on 01/02/2020. Currently 1.5. Uop ~ 10cc/hr today.  P: Follow renal function / urine output Trend Bmet Avoid nephrotoxins, ensure adequate renal perfusion   Hypomagnesemia Hypocalcemia Goal of 2. Receiving 2g currently. Repleting Ca.   Goals of care discussion  Patient has a significant failure to thrive and in so has suffered from several respiratory arrest over the last several weeks to months. Palliative care has been involved in the past with little interest in comfort care from family per report. Consulted yesterday and son opted to wait 48 hours to see how she progresses.  We discussed her condition this morning, multiple processes contributing to her illness and that this is not recoverable. Son was understandably distraught, would like some time to think this morning and  we will discuss more in the afternoon. He states he does not think she will come home from this but wanted to continue to wait for now.   Best practice:  Diet: NPO Pain/Anxiety/Delirium protocol (if indicated): fentanyl VAP protocol (if indicated): In place  DVT prophylaxis: SCDs GI prophylaxis: PPI Glucose control: SSI Mobility: Bedrest  Code Status: LCV, no chest compressions  Family Communication: Son updated at bedside 11/15  Disposition: ICU  Labs   CBC: Recent Labs  Lab 02/10/20 1511 02/10/20 1511 02/10/20 1544 02/10/20 1547  02/10/20 2029 02/10/20 2323 02/11/20 0300  WBC 21.8*  --   --   --   --  31.6* 27.7*  NEUTROABS 16.8*  --   --   --   --   --   --   HGB 7.9*   < > 9.2* 8.8* 10.9* 10.2* 9.2*  HCT 28.2*   < > 27.0* 26.0* 32.0* 35.6* 31.4*  MCV 102.9*  --   --   --   --  99.7 97.8  PLT 146*  --   --   --   --  183 155   < > = values in this interval not displayed.    Basic Metabolic Panel: Recent Labs  Lab 02/10/20 1544 02/10/20 1547 02/10/20 1805 02/10/20 2029 02/11/20 0300  NA 147* 151* 146* 147* 143  K 4.0 2.9* 4.1 4.1 4.0  CL  --  119* 115*  --  115*  CO2  --   --  16*  --  14*  GLUCOSE  --  139* 203*  --  228*  BUN  --  14 18  --  19  CREATININE  --  1.00 1.59*  --  1.55*  CALCIUM  --   --  7.6*  --  6.7*  MG  --   --   --   --  1.5*  PHOS  --   --   --   --  3.4   GFR: Estimated Creatinine Clearance: 19.7 mL/min (A) (by C-G formula based on SCr of 1.55 mg/dL (H)). Recent Labs  Lab 02/10/20 1511 02/10/20 1637 02/10/20 1805 02/10/20 2323 02/11/20 0300 02/11/20 0550  WBC 21.8*  --   --  31.6* 27.7*  --   LATICACIDVEN  --  7.4* 8.6* 10.4*  --  10.5*    Liver Function Tests: Recent Labs  Lab 02/10/20 1805  AST 94*  ALT 43  ALKPHOS 95  BILITOT 0.9  PROT 5.7*  ALBUMIN 1.8*   No results for input(s): LIPASE, AMYLASE in the last 168 hours. No results for input(s): AMMONIA in the last 168 hours.  ABG    Component Value Date/Time   PHART 7.409 02/10/2020 2029   PCO2ART 21.0 (L) 02/10/2020 2029   PO2ART 96 02/10/2020 2029   HCO3 13.3 (L) 02/10/2020 2029   TCO2 14 (L) 02/10/2020 2029   ACIDBASEDEF 10.0 (H) 02/10/2020 2029   O2SAT 98.0 02/10/2020 2029     Coagulation Profile: Recent Labs  Lab 02/10/20 2144 02/11/20 0300  INR 2.8* 5.3*    Cardiac Enzymes: No results for input(s): CKTOTAL, CKMB, CKMBINDEX, TROPONINI in the last 168 hours.  HbA1C: Hgb A1c MFr Bld  Date/Time Value Ref Range Status  11/18/2019 03:00 AM 5.6 4.8 - 5.6 % Final    Comment:     (NOTE) Pre diabetes:          5.7%-6.4%  Diabetes:              >  6.4%  Glycemic control for   <7.0% adults with diabetes     CBG: Recent Labs  Lab 02/10/20 1718 02/10/20 2038 02/10/20 2323 02/11/20 0426 02/11/20 0714  GLUCAP 115* 248* 231* 242* 136*    Seawell, Jaimie A, DO 02/11/2020, 7:22 AM Pager: [phone]

## 2020-02-11 NOTE — Progress Notes (Signed)
EEG complete - results pending 

## 2020-02-11 NOTE — Progress Notes (Signed)
ANTICOAGULATION CONSULT NOTE - Initial Consult  Pharmacy Consult for heparin Indication: chest pain/ACS  No Known Allergies  Patient Measurements: Height: 5\' 6"  (167.6 cm) Weight: 54 kg (119 lb) IBW/kg (Calculated) : 59.3 Heparin Dosing Weight: 54 kg  Vital Signs: Temp: 99 F (37.2 C) (11/14 2330) Temp Source: Oral (11/14 2330) BP: 93/58 (11/15 0115) Pulse Rate: 105 (11/15 0001)  Labs: Recent Labs    02/10/20 1511 02/10/20 1544 02/10/20 1547 02/10/20 1547 02/10/20 1805 02/10/20 2029 02/10/20 2144 02/10/20 2220 02/10/20 2323 02/11/20 0018  HGB 7.9*   < > 8.8*   < >  --  10.9*  --   --  10.2*  --   HCT 28.2*   < > 26.0*  --   --  32.0*  --   --  35.6*  --   PLT 146*  --   --   --   --   --   --   --  183  --   APTT  --   --   --   --   --   --  49*  --   --   --   LABPROT  --   --   --   --   --   --  28.3*  --   --   --   INR  --   --   --   --   --   --  2.8*  --   --   --   CREATININE  --   --  1.00  --  1.59*  --   --   --   --   --   TROPONINIHS  --   --   --   --  27*  --   --  63*  --  79*   < > = values in this interval not displayed.    Estimated Creatinine Clearance: 19.2 mL/min (A) (by C-G formula based on SCr of 1.59 mg/dL (H)).   Medical History: Past Medical History:  Diagnosis Date  . Arthritis   . CVA (cerebral vascular accident) (HCC)   . Degenerative disk disease   . Hypercholesterolemia   . Hypertension   . Osteoarthritis of right knee   . Renal disorder   . Stage 3a chronic kidney disease (HCC) 09/01/2019    Medications:  Medications Prior to Admission  Medication Sig Dispense Refill Last Dose  . acetaminophen (TYLENOL) 325 MG tablet Take 2 tablets (650 mg total) by mouth every 6 (six) hours as needed. 30 tablet 0   . albuterol (VENTOLIN HFA) 108 (90 Base) MCG/ACT inhaler Inhale 1-2 puffs into the lungs every 4 (four) hours as needed for wheezing or shortness of breath.      11/01/2019 apixaban (ELIQUIS) 5 MG TABS tablet Please take 10 mg oral  twice daily for next 5 days, then transition to 5 mg oral twice daily from 11/28/2019 (Patient taking differently: Take 5 mg by mouth 2 (two) times daily. ) 70 tablet 0   . Ascorbic Acid (VITAMIN C PO) Take 1 tablet by mouth daily.     . baclofen (LIORESAL) 10 MG tablet Take 10 mg by mouth 2 (two) times daily.     . Cholecalciferol (VITAMIN D) 2000 UNITS tablet Take 2,000 Units by mouth daily.       01/28/2020 diltiazem (CARDIZEM) 60 MG tablet Take 60 mg by mouth 2 (two) times daily.     . fluticasone (FLONASE) 50 MCG/ACT  nasal spray Place 1 spray into both nostrils daily as needed for allergies or rhinitis.      Marland Kitchen gabapentin (NEURONTIN) 100 MG capsule Take 1 capsule (100 mg total) by mouth 3 (three) times daily. 90 capsule 0   . meclizine (ANTIVERT) 12.5 MG tablet Take 1 tablet (12.5 mg total) by mouth 3 (three) times daily as needed for dizziness. 30 tablet 0   . metoprolol tartrate (LOPRESSOR) 25 MG tablet Take 1 tablet (25 mg total) by mouth 2 (two) times daily. 60 tablet 0   . Multiple Vitamins-Minerals (ZINC PO) Take 1 tablet by mouth daily.     . pantoprazole (PROTONIX) 40 MG tablet Take 1 tablet (40 mg total) by mouth daily. 30 tablet 0   . PHENobarbital (LUMINAL) 64.8 MG tablet Take 64.8 mg by mouth every 12 (twelve) hours as needed.     Bertram Gala Glycol-Propyl Glycol (SYSTANE OP) Place 1 drop into both eyes every morning.     . rosuvastatin (CRESTOR) 10 MG tablet Take 10 mg by mouth daily.       Assessment: 86 yo lady to start heparin for ACS.  She was on eliquis PTA, last dose unknown.  She received 5000 units sq heparin at ~21:00.  Her baseline aPTT was elevated 49 sec, Hg 10.2, PTLC 183.  Last dose eliquis likely yesterday am so okay to start heparin now without bolus. Goal of Therapy: : Heparin level 0.3-0.7 units/ml Monitor platelets by anticoagulation protocol: Yes   Plan:  Start heparin at 600 units/hr Check heparin level and aPTT 6-8 hrs after start Daily HL and aPTT until levels  correlate Monitor for bleeding complications  Leah Thornberry Poteet 02/11/2020,1:33 AM

## 2020-02-11 NOTE — Progress Notes (Addendum)
eLink Physician-Brief Progress Note Patient Name: Robin Moreno DOB: 30-Dec-1925 MRN: [id number]   Date of Service  02/11/2020  HPI/Events of Note  Troponin = 27 --> 63. EKG earlier revealed Junctional tachycardia with rate = 115. Low voltage, extremity leads. Repol abnrm suggests ischemia, anterolateral. Note that patient was on Hospice with DNR status that was recently reversed. Patient is on Norepinephrine IV infusion, therefore can't B-Block. INR = 2.8 earlier, therefore, patient is already anticoagulated.    eICU Interventions  Plan: 1. ASA 81 mg per tube now and Q day. 2. Continue to trend Troponin.     Intervention Category Major Interventions: Other:  Briona Korpela Dennard Nip 02/11/2020, 12:00 AM

## 2020-02-11 NOTE — Progress Notes (Signed)
eLink Physician-Brief Progress Note Patient Name: Robin Moreno DOB: 04-22-1925 MRN: [id number]   Date of Service  02/11/2020  HPI/Events of Note  INR = 2.8 --> 5.1 post starting Heparin. Patient is essentially anticoagulated.   eICU Interventions  Plan: 1. D/C Heparin IV infusion.  2. Transfuse 2 units FFP now. 3. Repeat PT/INR at 4 PM.      Intervention Category Major Interventions: Other:  Lenell Antu 02/11/2020, 4:11 AM

## 2020-02-11 NOTE — Procedures (Signed)
Central Venous Catheter Insertion Procedure Note CLOA BUSHONG [id number] 07-May-1925  Procedure: Insertion of Central Venous Catheter Indications: Drug and/or fluid administration  Procedure Details Consent: Risks of procedure as well as the alternatives and risks of each were explained to the (patient/caregiver).  Consent for procedure obtained. Time Out: Verified patient identification, verified procedure, site/side was marked, verified correct patient position, special equipment/implants available, medications/allergies/relevent history reviewed, required imaging and test results available.  Performed  Maximum sterile technique was used including antiseptics, cap, gloves, gown, hand hygiene, mask and sheet. Skin prep: Chlorhexidine; local anesthetic administered A antimicrobial bonded/coated triple lumen catheter was placed in the left internal jugular vein using the Seldinger technique. Catheter placed to 18 cm. Blood aspirated via all 3 ports and then flushed x 3. Line sutured x 4 and dressing applied.  Ultrasound guidance used.Yes.    Evaluation Blood flow good Complications: No apparent complications Patient did tolerate procedure well. Chest X-ray ordered to verify placement.  CXR: normal.  Tametha Banning A, DO 02/11/2020, 1:30 PM Pager: [phone]

## 2020-02-11 NOTE — Progress Notes (Signed)
Pharmacy Antibiotic Note  Robin Moreno is a 86 y.o. female admitted on 02/10/2020 with Strep and Staph species bacteremia and sepsis.  Pharmacy has been consulted for Vancomycin dosing. Already on Ceftriaxone. MRSA PCR positive.  WBC trending down from 31.6 to 27.7. SCr 1.55 (up from baseline ~0.87-1) with estimated CrCl ~ 19 mL/min. Afebrile. Remains on vasopressor therapy with Levophed at 8 and Vasopressin at 0.03.   Plan: Vancomycin 1250 mg IV x1 then 750mg  IV every 48 hours.  Continue Ceftriaxone 2g IV every 24 hours.  Monitor renal function, culture results, and clinical status.   Height: 5\' 6"  (167.6 cm) Weight: 54 kg (119 lb) IBW/kg (Calculated) : 59.3  Temp (24hrs), Avg:98.3 F (36.8 C), Min:96.8 F (36 C), Max:99.3 F (37.4 C)  Recent Labs  Lab 02/10/20 1511 02/10/20 1547 02/10/20 1637 02/10/20 1805 02/10/20 2323 02/11/20 0300 02/11/20 0550  WBC 21.8*  --   --   --  31.6* 27.7*  --   CREATININE  --  1.00  --  1.59*  --  1.55*  --   LATICACIDVEN  --   --  7.4* 8.6* 10.4*  --  10.5*    Estimated Creatinine Clearance: 19.7 mL/min (A) (by C-G formula based on SCr of 1.55 mg/dL (H)).    No Known Allergies  Antimicrobials this admission: CTX 11/14 >> Vanc 11/15 >>  Dose adjustments this admission:   Microbiology results: 11/14 MRSA PCR positive 11/14 UCx >> 11/14 BCx >> 1 set 2/2 Strep and Staph species 11/15 Resent BCx >>  Thank you for allowing pharmacy to be a part of this patient's care.  12/14, PharmD, BCPS, BCCCP Clinical Pharmacist Please refer to [facility] for [facility] numbers 02/11/2020 11:33 AM

## 2020-02-11 NOTE — Progress Notes (Signed)
PHARMACY - PHYSICIAN COMMUNICATION CRITICAL VALUE ALERT - BLOOD CULTURE IDENTIFICATION (BCID)  Robin Moreno is an 86 y.o. female who presented to [facility] on 02/10/2020 after being found down at home.  Assessment:  Pt intubated and on pressors and bicarb infusion, started on ABX for UTI, now w/ 2 of 2 blood cx bottles growing staph spp and strep spp, no resistance detected.  Name of physician (or Provider) Contacted: SSommer  Current antibiotics: Rocephin  Changes to prescribed antibiotics recommended:  Increase Rocephin to 2g IV Q24H.  Results for orders placed or performed during the hospital encounter of 02/10/20  Blood Culture ID Panel (Reflexed) (Collected: 02/10/2020  4:03 PM)  Result Value Ref Range   Enterococcus faecalis NOT DETECTED NOT DETECTED   Enterococcus Faecium NOT DETECTED NOT DETECTED   Listeria monocytogenes NOT DETECTED NOT DETECTED   Staphylococcus species DETECTED (A) NOT DETECTED   Staphylococcus aureus (BCID) NOT DETECTED NOT DETECTED   Staphylococcus epidermidis NOT DETECTED NOT DETECTED   Staphylococcus lugdunensis NOT DETECTED NOT DETECTED   Streptococcus species DETECTED (A) NOT DETECTED   Streptococcus agalactiae NOT DETECTED NOT DETECTED   Streptococcus pneumoniae NOT DETECTED NOT DETECTED   Streptococcus pyogenes NOT DETECTED NOT DETECTED   A.calcoaceticus-baumannii NOT DETECTED NOT DETECTED   Bacteroides fragilis NOT DETECTED NOT DETECTED   Enterobacterales NOT DETECTED NOT DETECTED   Enterobacter cloacae complex NOT DETECTED NOT DETECTED   Escherichia coli NOT DETECTED NOT DETECTED   Klebsiella aerogenes NOT DETECTED NOT DETECTED   Klebsiella oxytoca NOT DETECTED NOT DETECTED   Klebsiella pneumoniae NOT DETECTED NOT DETECTED   Proteus species NOT DETECTED NOT DETECTED   Salmonella species NOT DETECTED NOT DETECTED   Serratia marcescens NOT DETECTED NOT DETECTED   Haemophilus influenzae NOT DETECTED NOT DETECTED   Neisseria meningitidis NOT  DETECTED NOT DETECTED   Pseudomonas aeruginosa NOT DETECTED NOT DETECTED   Stenotrophomonas maltophilia NOT DETECTED NOT DETECTED   Candida albicans NOT DETECTED NOT DETECTED   Candida auris NOT DETECTED NOT DETECTED   Candida glabrata NOT DETECTED NOT DETECTED   Candida krusei NOT DETECTED NOT DETECTED   Candida parapsilosis NOT DETECTED NOT DETECTED   Candida tropicalis NOT DETECTED NOT DETECTED   Cryptococcus neoformans/gattii NOT DETECTED NOT DETECTED    Vernard Gambles, PharmD, BCPS  02/11/2020  6:01 AM

## 2020-02-11 NOTE — Progress Notes (Signed)
   02/11/20 1100  Clinical Encounter Type  Visited With Patient  Visit Type Critical Care  Referral From Family  Consult/Referral To Chaplain  Spiritual Encounters  Spiritual Needs Prayer;Emotional  Stress Factors  Patient Stress Factors Major life changes  Family Stress Factors Major life changes  Patient's son, Crissie Aloi asked if Chaplain could come see his mother. He was at bedside but was not present when Chaplain visited with patient. Patient was very sleepy but opened her eyes. Chaplain prayed and left. Chaplain available when needed.

## 2020-02-11 NOTE — Progress Notes (Signed)
[facility] 2M10- [facility]) hospitalized hospice patient.   This is a current [facility] hospice patient with a terminal diagnosis of Left frontoparietal cerebral infarction.  Patient was found down unresponsive by family with ineffective respirations requiring bag mask ventilation. EMS was activated. Patient was transported to [facility] where she was bradycardic, hypotensive, and severely hypoxic.  Patient had a OOF DNR but was treated as full code. She was intubated. She was admitted with a diagnosis of Respiratory arrest with profound hypoxia and circulatory shock. Per Dr. Patric Dykes with [facility] this is related hospital admission.   Attempted to visit patient at bedside but she was in procedure for CVC placement. Received report from bedside RN. No family present. Patient continues to be unresponsive except to pain. She is intubated on ventilator.  NG tube placed.   VS: 97.6 aux, 91, 35, SpO2 100% on Vent with Fi02% 40, MEWs score RED I/O:  4588.7/300 Abn labs:  02/11/2020 03:00 Chloride: 115 (H) CO2: 14 (L) Glucose: 228 (H) Creatinine: 1.55 (H) Calcium: 6.7 (L) Magnesium: 1.5 (L) Albumin: 1.6 (L) AST: 155 (H) ALT: 97 (H) Total Protein: 4.5 (L) Bilirubin, Direct: 0.4 (H) GFR, Estimated: 31 (L) WBC: 27.7 (H) RBC: 3.21 (L) Hemoglobin: 9.2 (L) HCT: 31.4 (L) MCHC: 29.3 (L) Prothrombin Time: 47.4 (H) INR: 5.3 (HH) 02/11/2020 14:58 Lactic Acid, Venous: >11.0 (HH) 02/10/2020 20:29 pCO2 arterial: 21.0 (L) pO2, Arterial: 96 TCO2: 14 (L) Acid-base deficit: 10.0 (H) Bicarbonate: 13.3 (L)  IV/PRN meds: Rocephin 2g IVPB QD, Precedex 400 MCG/156ml Rate: 5.4-16.2 mL/hr Dose: 0.4-1.2 mcg/kg/hr titrated.   Problem list:  Patient critically ill due to septic shock with multiorgan failure Plan-- Continue vent support, ceftriaxone - No further escalation of pressors - 48 trial of support per palliative discussion with son 11/14 - Continue to engage son as patient appears  to be suffering through her end of life process  Discharge planning: Undetermined, possible hospital death Family contact: Spoke with son by phone and offered support  IDG: Team updated Goals of Care: Ongoing conversation with son  Please call with any hospice related questions.   Elsie Saas, RN, CCM   [facility] Liaison (listed on [facility] under Hospice/[facility])     [phone]

## 2020-02-11 NOTE — Progress Notes (Signed)
eLink Physician-Brief Progress Note Patient Name: ANTONIA JICHA DOB: 03-11-1926 MRN: [id number]   Date of Service  02/11/2020  HPI/Events of Note  Lactic Acid = 10.4. Patient has femoral CVL, therefore, can't get CVP. Hgb = 10.2. CXR earlier does not reveal pulmonary congestion. LVEF = 55-60% Mild MR and MS. Troponin = 27 --> 63 --> 79. BP soft  At 93/58, therefore, will avoid B-Blocks.   eICU Interventions  Plan: 1. Bolus with 0.9 NaCl 1 liter IV over 1 hour now.  2. Continue to trend lactic Acid. 3. Heparin per pharmacy protocol      Intervention Category Major Interventions: Acid-Base disturbance - evaluation and management;Other:  Ustin Cruickshank Dennard Nip 02/11/2020, 1:21 AM

## 2020-02-12 DIAGNOSIS — I959 Hypotension, unspecified: Secondary | ICD-10-CM

## 2020-02-12 DIAGNOSIS — R092 Respiratory arrest: Secondary | ICD-10-CM | POA: Diagnosis not present

## 2020-02-12 DIAGNOSIS — Z515 Encounter for palliative care: Secondary | ICD-10-CM

## 2020-02-12 DIAGNOSIS — Z7189 Other specified counseling: Secondary | ICD-10-CM

## 2020-02-12 DIAGNOSIS — J9601 Acute respiratory failure with hypoxia: Secondary | ICD-10-CM | POA: Diagnosis not present

## 2020-02-12 LAB — COMPREHENSIVE METABOLIC PANEL
ALT: 775 U/L — ABNORMAL HIGH (ref 0–44)
AST: 1558 U/L — ABNORMAL HIGH (ref 15–41)
Albumin: 2 g/dL — ABNORMAL LOW (ref 3.5–5.0)
Alkaline Phosphatase: 110 U/L (ref 38–126)
Anion gap: 20 — ABNORMAL HIGH (ref 5–15)
BUN: 20 mg/dL (ref 8–23)
CO2: 24 mmol/L (ref 22–32)
Calcium: 7.3 mg/dL — ABNORMAL LOW (ref 8.9–10.3)
Chloride: 101 mmol/L (ref 98–111)
Creatinine, Ser: 1.42 mg/dL — ABNORMAL HIGH (ref 0.44–1.00)
GFR, Estimated: 35 mL/min — ABNORMAL LOW (ref >60)
Glucose, Bld: 170 mg/dL — ABNORMAL HIGH (ref 70–99)
Potassium: 2.8 mmol/L — ABNORMAL LOW (ref 3.5–5.1)
Sodium: 145 mmol/L (ref 135–145)
Total Bilirubin: 0.8 mg/dL (ref 0.3–1.2)
Total Protein: 5.4 g/dL — ABNORMAL LOW (ref 6.5–8.1)

## 2020-02-12 LAB — BASIC METABOLIC PANEL
Anion gap: 15 (ref 5–15)
BUN: 19 mg/dL (ref 8–23)
CO2: 23 mmol/L (ref 22–32)
Calcium: 6.9 mg/dL — ABNORMAL LOW (ref 8.9–10.3)
Chloride: 102 mmol/L (ref 98–111)
Creatinine, Ser: 1.21 mg/dL — ABNORMAL HIGH (ref 0.44–1.00)
GFR, Estimated: 42 mL/min — ABNORMAL LOW (ref >60)
Glucose, Bld: 116 mg/dL — ABNORMAL HIGH (ref 70–99)
Potassium: 2.5 mmol/L — CL (ref 3.5–5.1)
Sodium: 140 mmol/L (ref 135–145)

## 2020-02-12 LAB — BPAM FFP
Blood Product Expiration Date: 202111162359
Blood Product Expiration Date: 202111162359
ISSUE DATE / TIME: 202111150538
ISSUE DATE / TIME: 202111150538
Unit Type and Rh: 6200
Unit Type and Rh: 6200

## 2020-02-12 LAB — PREPARE FRESH FROZEN PLASMA
Unit division: 0
Unit division: 0

## 2020-02-12 LAB — GLUCOSE, CAPILLARY
Glucose-Capillary: 108 mg/dL — ABNORMAL HIGH (ref 70–99)
Glucose-Capillary: 114 mg/dL — ABNORMAL HIGH (ref 70–99)
Glucose-Capillary: 115 mg/dL — ABNORMAL HIGH (ref 70–99)
Glucose-Capillary: 147 mg/dL — ABNORMAL HIGH (ref 70–99)
Glucose-Capillary: 152 mg/dL — ABNORMAL HIGH (ref 70–99)

## 2020-02-12 LAB — CBC
HCT: 26.8 % — ABNORMAL LOW (ref 36.0–46.0)
Hemoglobin: 8.5 g/dL — ABNORMAL LOW (ref 12.0–15.0)
MCH: 29.5 pg (ref 26.0–34.0)
MCHC: 31.7 g/dL (ref 30.0–36.0)
MCV: 93.1 fL (ref 80.0–100.0)
Platelets: 82 10*3/uL — ABNORMAL LOW (ref 150–400)
RBC: 2.88 MIL/uL — ABNORMAL LOW (ref 3.87–5.11)
RDW: 15.7 % — ABNORMAL HIGH (ref 11.5–15.5)
WBC: 19.5 10*3/uL — ABNORMAL HIGH (ref 4.0–10.5)
nRBC: 0.1 % (ref 0.0–0.2)

## 2020-02-12 LAB — MAGNESIUM: Magnesium: 2 mg/dL (ref 1.7–2.4)

## 2020-02-12 LAB — PROTIME-INR
INR: 2.3 — ABNORMAL HIGH (ref 0.8–1.2)
Prothrombin Time: 24.7 seconds — ABNORMAL HIGH (ref 11.4–15.2)

## 2020-02-12 MED ORDER — MORPHINE SULFATE (PF) 2 MG/ML IV SOLN: 2.0000 mg | INTRAVENOUS | Status: DC | PRN

## 2020-02-12 MED ORDER — POTASSIUM CHLORIDE 10 MEQ/50ML IV SOLN
10.0000 meq | INTRAVENOUS | Status: AC
  Administered 2020-02-12 (×3): 10 meq via INTRAVENOUS
  Filled 2020-02-12 (×4): qty 50

## 2020-02-12 MED ORDER — LACTATED RINGERS IV BOLUS
500.0000 mL | Freq: Once | INTRAVENOUS | Status: AC
  Administered 2020-02-12: 500 mL via INTRAVENOUS

## 2020-02-12 MED ORDER — POTASSIUM CHLORIDE 20 MEQ PO PACK
40.0000 meq | PACK | Freq: Once | ORAL | Status: AC
  Administered 2020-02-12: 40 meq
  Filled 2020-02-12: qty 2

## 2020-02-12 MED ORDER — POTASSIUM CHLORIDE 20 MEQ PO PACK
40.0000 meq | PACK | ORAL | Status: AC
  Administered 2020-02-13 (×3): 40 meq
  Filled 2020-02-12 (×3): qty 2

## 2020-02-12 MED ORDER — MORPHINE SULFATE (PF) 2 MG/ML IV SOLN: 1.0000 mg | INTRAVENOUS | Status: DC | PRN

## 2020-02-12 NOTE — Progress Notes (Addendum)
PCCM Attending Note 02/12/2020  I saw and evaluated the patient. Discussed with resident and agree with resident's findings and plan as documented in the resident's note.  I have seen and evaluated the patient for shock with multiorgan failure.  S:  No events, remains comatose with no sedation. Family at bedside.  O: Blood pressure 115/81, pulse (!) 212, temperature 98 F (36.7 C), temperature source Axillary, resp. rate (!) 34, height 5\' 6"  (1.676 m), weight 55.1 kg, SpO2 (!) 80 %.  Frail elderly woman on ventilator Heart sounds regular, ext lukewarm Scattered lung rhonci, passive on vent Global mild anasarca GCS3  Worsening liver failure K low CBC stable  A:  Septic shock with multiorgan failure Persistent metabolic encephalopathy Frail elderly approaching end of life  P:  - Family to come in today - Trial of extubation, if does poorly, give morphine and allow to pass in hospital - If does okay without ventilator, work with family to get home with hospice  Patient critically ill due to septic shock with multiorgan failure Interventions to address this today mechanical ventilation titration, family discussions, pressor adjustments Risk of deterioration without these interventions is high  35 minutes critical care time  02/12/2020 02/14/2020 MD      NAME:  Robin Moreno, MRN:  Robin Moreno, DOB:  [birth year], LOS: 2 ADMISSION DATE:  02/10/2020, CONSULTATION DATE:  02/10/2020 REFERRING MD:  Dr. 02/12/2020, CHIEF COMPLAINT:  Respiratory arrest    Brief History   86 year old female found down at home by family with ineffective respirations requiring bag mask ventilation.  Per report patient never lost pulses.  GCS 3 on admission  History of present illness   Robin Moreno is a 86 year old female with a past medical history significant for stage IIIa CKD, hypertension, hyperlipidemia, osteoarthritis, prior CVA, and recent COVID-19 pneumonia who presented to the emergency  department today after being found down by family.  On arrival to ED patient was being assisted with bag mask ventilation with GCS of 3 requiring emergent intubation for airway protection.  Never lost pulses.  History obtained from chart review and verbal report as patient is currently encephalopathic and intubated.  On arrival patient was seen bradycardic, hypotensive, and severely hypoxic.  Of note patient was recently been treated at this facility for similar presentation with agonal breathing's and respiratory arrest on multiple occasions.  Most recent admission was 827 - 8/30 at which time patient evaluated by palliative care and decision was made to discharge home with outpatient palliative follow-up, son declined hospice care at that time. PCCM consulted for further management and admission.   Past Medical History  Stage 3a CKD HTN HLD DDD CVA Arthritis  Significant Hospital Events   11/14 > Admitted post resp arrest   Consults:  Palliative  Procedures:  Intubated on arrival 11/14 [facility] 11/15   Significant Diagnostic Tests:    Micro Data:  Blood culture 11/14 > Urine culture 11/14 >  Antimicrobials:  Ceftriaxone 11/14  Vanc 11/15  Interim history/subjective:  Moves to sternal rub and LE painful stimuli. Labs worsening overnight with signs of organ failure. Discussed with son this morning and will plan for extubation later today after family is able to arrive.   Objective   Blood pressure 100/87, pulse (!) 189, temperature 98 F (36.7 C), temperature source Oral, resp. rate 14, height 5\' 6"  (1.676 m), weight 55.1 kg, SpO2 97 %.    Vent Mode: PRVC FiO2 (%):  [40 %] 40 % Set Rate:  12/15  bmp-34 bmp] 34 bmp Vt Set:  [470 mL] 470 mL PEEP:  [5 cmH20] 5 cmH20 Plateau Pressure:  [18 cmH20-24 cmH20] 22 cmH20   Intake/Output Summary (Last 24 hours) at 02/12/2020 8786 Last data filed at 02/12/2020 0600 Gross per 24 hour  Intake 4887.19 ml  Output 516 ml  Net 4371.19 ml    Filed Weights   02/10/20 1600 02/12/20 0406  Weight: 54 kg 55.1 kg    Examination: General: Chronically ill appearing very frail deconditioned elderly female lying in bed on mechanical ventilation, in NAD HEENT: ETT, MM pink/moist, PERRL,  Neuro: Unresponsive with no gag or corneal reflex, withdraws to LE painful stimuli and sternal rub CV: s1s2 regular rate and rhythm, III/VI systolic murmur PULM:  Mechanical breath sounds, no added breath sounds, breathing over vent  GI: soft, bowel sounds active in all 4 quadrants, non-tender, non-distended Extremities: warm/dry, +1 LE edema  Skin: no rashes or lesions   Resolved Hospital Problem list     Assessment & Plan:  Intubated s/p Respiratory arrest with profound hypoxia on arrival  At risk for anoxic encephalopathy. -Per report patient never lost pulses, had agonal breathing for several hours, high concern for possible anoxic injury  EEG without seizure, diffuse encephalopathy. Continues to worsen with end organ damage. Discussed prognosis with son, concern for anoxic injury. He has previously discussed with her that she would not want to be on the vent long term, and would like to plan for one way extubation.  P: -cont. Pressor support for now -extubation after arrival of family  -Head of bed elevated 30 degrees. Plateau pressures less than 30 cm H20.  Follow intermittent chest x-ray and ABG.   -Ensure adequate pulmonary hygiene  -Follow cultures  -VAP bundle in place  -RASS goal 0  -prn versed and morphine  Septic shock with staph spp and strep bacteremia   -Cont. Ceftriaxone 2g -Palliative care consulted, appreciate assistance  -Continue pressor support for MAP goal > 65 - on levo/vasopressin  -Continue bicarb gtt -Cont. Stress dose steroids  Lateral Ischemia Coagulopathy Likely demand, troponin mildly elevated. Anticoagulated with INR 5.3, improved to 2.3 today -s/p 2U FFP -SCDs  -cont. Asa 81 mg   Failure to  thrive  -Long standing history of progressive decline P: See GOC discussion below   HX of stage 3a CKD per chart review  Minimal urine output. 500 cc o/n P: Follow renal function / urine output Trend Bmet Avoid nephrotoxins, ensure adequate renal perfusion   Electrolyte Disturbance repleting K this am   Best practice:  Diet: NPO Pain/Anxiety/Delirium protocol (if indicated): fentanyl, morphine VAP protocol (if indicated): In place  DVT prophylaxis: SCDs GI prophylaxis: PPI Glucose control: SSI Mobility: Bedrest  Code Status: LCV, no chest compressions  Family Communication: Son updated at bedside 11/16 Disposition: ICU  Labs   CBC: Recent Labs  Lab 02/10/20 1511 02/10/20 1511 02/10/20 1544 02/10/20 1547 02/10/20 2029 02/10/20 2323 02/11/20 0300  WBC 21.8*  --   --   --   --  31.6* 27.7*  NEUTROABS 16.8*  --   --   --   --   --   --   HGB 7.9*   < > 9.2* 8.8* 10.9* 10.2* 9.2*  HCT 28.2*   < > 27.0* 26.0* 32.0* 35.6* 31.4*  MCV 102.9*  --   --   --   --  99.7 97.8  PLT 146*  --   --   --   --  183 155   < > = values in this interval not displayed.    Basic Metabolic Panel: Recent Labs  Lab 02/10/20 1547 02/10/20 1805 02/10/20 2029 02/11/20 0300 02/11/20 1618 02/12/20 0343  NA 151* 146* 147* 143  --  145  K 2.9* 4.1 4.1 4.0  --  2.8*  CL 119* 115*  --  115*  --  101  CO2  --  16*  --  14*  --  24  GLUCOSE 139* 203*  --  228*  --  170*  BUN 14 18  --  19  --  20  CREATININE 1.00 1.59*  --  1.55*  --  1.42*  CALCIUM  --  7.6*  --  6.7*  --  7.3*  MG  --   --   --  1.5* 1.9  --   PHOS  --   --   --  3.4  --   --    GFR: Estimated Creatinine Clearance: 22 mL/min (A) (by C-G formula based on SCr of 1.42 mg/dL (H)). Recent Labs  Lab 02/10/20 1511 02/10/20 1637 02/10/20 1805 02/10/20 2323 02/11/20 0300 02/11/20 0550 02/11/20 1458  WBC 21.8*  --   --  31.6* 27.7*  --   --   LATICACIDVEN  --    < > 8.6* 10.4*  --  10.5* >11.0*   < > = values in this  interval not displayed.    Liver Function Tests: Recent Labs  Lab 02/10/20 1805 02/11/20 0300 02/12/20 0343  AST 94* 155* 1,558*  ALT 43 97* 775*  ALKPHOS 95 76 110  BILITOT 0.9 1.1 0.8  PROT 5.7* 4.5* 5.4*  ALBUMIN 1.8* 1.6* 2.0*   No results for input(s): LIPASE, AMYLASE in the last 168 hours. No results for input(s): AMMONIA in the last 168 hours.  ABG    Component Value Date/Time   PHART 7.409 02/10/2020 2029   PCO2ART 21.0 (L) 02/10/2020 2029   PO2ART 96 02/10/2020 2029   HCO3 13.3 (L) 02/10/2020 2029   TCO2 14 (L) 02/10/2020 2029   ACIDBASEDEF 10.0 (H) 02/10/2020 2029   O2SAT 98.0 02/10/2020 2029     Coagulation Profile: Recent Labs  Lab 02/10/20 2144 02/11/20 0300 02/12/20 0343  INR 2.8* 5.3* 2.3*    Cardiac Enzymes: No results for input(s): CKTOTAL, CKMB, CKMBINDEX, TROPONINI in the last 168 hours.  HbA1C: Hgb A1c MFr Bld  Date/Time Value Ref Range Status  11/18/2019 03:00 AM 5.6 4.8 - 5.6 % Final    Comment:    (NOTE) Pre diabetes:          5.7%-6.4%  Diabetes:              >6.4%  Glycemic control for   <7.0% adults with diabetes     CBG: Recent Labs  Lab 02/11/20 1121 02/11/20 1515 02/11/20 1919 02/11/20 2329 02/12/20 0338  GLUCAP 237* 205* 167* 146* 147*    Seawell, Jaimie A, DO 02/12/2020, 6:52 AM Pager: [phone]

## 2020-02-12 NOTE — Progress Notes (Signed)
Daily Progress Note   Patient Name: Robin Moreno       Date: 02/12/2020 DOB: [birth year]  Age: 86 y.o. MRN#: [id number] Attending Physician: Candee Furbish, MD Primary Care Physician: Jilda Panda, MD Admit Date: 02/10/2020  Reason for Follow-up: continued [place] discussions  Subjective:  Chart reviewed and received report from primary RN. Patient remains on vasopressors - levophed and vasopressin. Is not currently weaning on ventilator. Remains minimally responsive.   Son Robin Moreno is at bedside. He reports receiving an update this morning from the critical care team. He verbalizes understanding that patient is in multi-system organ failure.  He states "I know what I have to do". He also states he will not let her "lay here like this much longer". He expresses that he ultimately intends to honor her wishes, which was to allow for a natural death. We discussed one-way extubation and what that means. He verbalizes understanding, and requests to give her "one more day" to see if she will improve. Discussed the risk in the meantime that she may continue to decline and potentially die on the ventilator.   I allow for space and opportunity to Robin Moreno to lament the type of person his mother was. 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 He expresses regret over having his mother resuscitated, stating if he knew "it would turn out like this, I would have let her pass at home". He was hopeful she would turn improve as she had done in the past. Emotional support provided.   Length of Stay: 2  Current Medications: Scheduled Meds:  . aspirin  81 mg Per Tube Daily  . chlorhexidine gluconate (MEDLINE KIT)  15 mL Mouth Rinse BID  . Chlorhexidine Gluconate Cloth  6  each Topical Q0600  . hydrocortisone sod succinate (SOLU-CORTEF) inj  50 mg Intravenous Q6H  . insulin aspart  0-9 Units Subcutaneous Q4H  . mouth rinse  15 mL Mouth Rinse 10 times per day  . mupirocin ointment  1 application Nasal BID  . polyethylene glycol  17 g Per Tube Daily  . senna-docusate  1 tablet Per Tube QHS    Continuous Infusions: . cefTRIAXone (ROCEPHIN)  IV 2 g (02/12/20 1126)  . dexmedetomidine (PRECEDEX) IV infusion Stopped (02/10/20 1839)  . norepinephrine (  LEVOPHED) Adult infusion 3 mcg/min (02/12/20 1000)  . [START ON 02/13/2020] vancomycin    . vasopressin 0.03 Units/min (02/12/20 1000)    PRN Meds: docusate, fentaNYL (SUBLIMAZE) injection, fentaNYL (SUBLIMAZE) injection, midazolam, midazolam, morphine injection, ondansetron (ZOFRAN) IV  Physical Exam Vitals reviewed.  Constitutional:      General: She is not in acute distress.    Appearance: She is ill-appearing.  HENT:     Head: Normocephalic and atraumatic.  Cardiovascular:     Rate and Rhythm: Normal rate and regular rhythm.  Pulmonary:     Comments: Intubated, ventilated Neurological:     Comments: Eyes open spontaneously, does not track Does not follow commands             Vital Signs: BP 134/71   Pulse 88   Temp 98 F (36.7 C) (Axillary)   Resp (!) 34   Ht 5' 6"  (1.676 m)   Wt 55.1 kg   SpO2 100%   BMI 19.61 kg/m  SpO2: SpO2: 100 % O2 Device: O2 Device: Ventilator O2 Flow Rate:    Intake/output summary:   Intake/Output Summary (Last 24 hours) at 02/12/2020 1206 Last data filed at 02/12/2020 1000 Gross per 24 hour  Intake 2898.51 ml  Output 440 ml  Net 2458.51 ml   LBM: Last BM Date: 02/11/20 Baseline Weight: Weight: 54 kg Most recent weight: Weight: 55.1 kg       Palliative Assessment/Data: PPS 10%    Flowsheet Rows     Most Recent Value  Intake Tab  Referral Department Critical care  Unit at Time of Referral ER  Palliative Care Primary Diagnosis Pulmonary  Date  Notified 02/10/20  Palliative Care Type Return patient Palliative Care  Reason for referral Clarify Goals of Care  Date of Admission 02/10/20  Date first seen by Palliative Care 02/10/20  # of days Palliative referral response time 0 Day(s)  # of days IP prior to Palliative referral 0  Clinical Assessment  Psychosocial & Spiritual Assessment  Palliative Care Outcomes  Patient/Family meeting held? Yes  Who was at the meeting? son  Palliative Care Outcomes Clarified goals of care, Counseled regarding hospice        Palliative Care Assessment & Plan   HPI/Patient Profile: 86 y.o. female  with past medical history of CKD, hypertension, hyperlipidemia, osteoarthritis, prior CVA, and recent COVID-19 pneumonia presented to the ED on 02/10/20 for AMS and unresponsiveness at home. This patient has been with [facility] hospice services for the last 6 weeks; however, son/Robin Moreno requested full aggressive measures for patient on arrival to ED. Patient was intubated and had central line placed upon arrival.  PMT was involved in patient's previous admission in August 2021 for FTT and encephalopathy.  Of note, patient has had 4 admissions and 4 ED visits in the last 6 months. Her albumin on 02/10/20 was 1.8.  Assessment: - s/p respiratory arrest with significant hypoxia on arrival - at risk for anoxic encephalopathy - septic shock with multiorgan failure - metabolic encephalopathy - hx of stage 3 CKD - failure to thrive  Recommendations/Plan: - Partial code, no CPR - current current care for now - plan for one way extubation tomorrow (time to be determined) - I will be available for support tomorrow as needed from 10am--8pm  Goals of Care and Additional Recommendations: Limitations on Scope of Treatment: No Tracheostomy  Code Status: partial  Prognosis: poor  Discharge Planning: Anticipated Hospital Death  Care plan was discussed with Dr. Sharon Seller and primary RN  Thank you for  allowing the Palliative Medicine Team to assist in the care of this patient.   Total Time 35 minutes Prolonged Time Billed  no       Greater than 50%  of this time was spent counseling and coordinating care related to the above assessment and plan.  Lavena Bullion, NP  Please contact Palliative Medicine Team phone at [phone] for questions and concerns.

## 2020-02-12 NOTE — Progress Notes (Signed)
Nutrition Brief Note  Chart reviewed. Patient discussed in ICU rounds and with CCM today. Plans for 1-way extubation today or tomorrow. Patient is followed by Hospice at home. No nutrition interventions indicated at this time. Please consult RD if nutrition plans change.  Gabriel Rainwater, RD, LDN, CNSC Please refer to [facility] for contact information.

## 2020-02-12 NOTE — Progress Notes (Signed)
eLink Physician-Brief Progress Note Patient Name: Robin Moreno DOB: 04/26/1925 MRN: [id number]   Date of Service  02/12/2020  HPI/Events of Note  Repeat K 2.5 from 2.8 after 80 meqs K  eICU Interventions  Ordered K 40 meqs q 4 x 3 doses     Intervention Category Major Interventions: Electrolyte abnormality - evaluation and management  Darl Pikes 02/12/2020, 11:55 PM

## 2020-02-12 NOTE — Progress Notes (Signed)
[facility] 2M10- [facility]) hospitalized hospice patient.   This is a current [facility] hospice patient with a terminal diagnosis of Left frontoparietal cerebral infarction.  Patient was found down unresponsive by family with ineffective respirations requiring bag mask ventilation. EMS was activated. Patient was transported to [facility] where she was bradycardic, hypotensive, and severely hypoxic. Patient had a OOF DNR but was treated as full code. She was intubated. She was admitted with a diagnosis of Respiratoryarrestwith profound hypoxia and circulatory shock. Per Dr. Patric Dykes with [facility] this is related hospital admission.   Visited patient at bedside with son, Daryle present. Patient is intubated, has an NG tube and is unresponsive to voice and touch. She occasionally opens eyes but not focused on anything.   VS: 97.9, 90/71, 91, 34, 100% on vent with FIO2 40, MEWS score Red I/O: 761.9/0 Abn Labs:  02/12/2020 03:43 Potassium: 2.8 (L) Glucose: 170 (H) Creatinine: 1.42 (H) Calcium: 7.3 (L) Anion gap: 20 (H) Albumin: 2.0 (L) AST: 1,558 (H) ALT: 775 (H) Total Protein: 5.4 (L) GFR, Estimated: 35 (L) 02/12/2020 03:43 Prothrombin Time: 24.7 (H) INR: 2.3 (H) Glucose: 170 (H) 02/12/2020 05:00 WBC: 19.5 (H) RBC: 2.88 (L) Hemoglobin: 8.5 (L) HCT: 26.8 (L) RDW: 15.7 (H) Platelets: 82 (L)   IV/PRN meds: Rocephin 2g IVPB QD, Precedex 400 MCG/156ml Rate: 5.4-16.2 mL/hr Dose: 0.4-1.2 mcg/kg/hr titrated. Levophed 16mg /231ml titrated for SBP>90. Vasopressin 20u/141ml titrated for HR >60 to <120. No PRNs given.   Problem list: Septic shock with multiorgan failure Persistent metabolic encephalopathy Frail elderly approaching end of life  P:  - Trial of extubation, if does poorly, give morphine and allow to pass in hospital - If does okay without ventilator, work with family to get home with hospice  Discharge planning: Undetermined, possible hospital  death Family contact: Spoke with son at bedside. He states he is having a hard time and keeps looking for signs she will improve. He shared video of her with kitten he filled prior to this hospital admission.   IDG: Team updated Goals of Care: Ongoing conversation with son. Plan is to extubate patient 11/17.   Please call with any hospice related questions.   12/17, RN, CCM   [facility] Liaison (listed on [facility] under Hospice/[facility])     [phone]

## 2020-02-12 NOTE — Progress Notes (Signed)
K+2.8 Replaced per protocol  

## 2020-02-12 NOTE — Progress Notes (Signed)
eLink Physician-Brief Progress Note Patient Name: Robin Moreno DOB: [birth year] MRN: [id number]   Date of Service  02/12/2020  HPI/Events of Note  Notified of patient going into afib RVR 130s with subsequent decrease in BP. Noted plans of possible comfort care in am.  K 2.8 this morning  eICU Interventions  Ordered 500 cc LR bolus. Although already 8 liters positive since admission. Repeat electrolytes Consider amiodarone if persistently in afib RVR despite above intervention     Intervention Category Major Interventions: Arrhythmia - evaluation and management  Darl Pikes 02/12/2020, 10:04 PM

## 2020-02-13 ENCOUNTER — Other Ambulatory Visit (HOSPITAL_COMMUNITY)

## 2020-02-13 DIAGNOSIS — Z978 Presence of other specified devices: Secondary | ICD-10-CM

## 2020-02-13 DIAGNOSIS — J9601 Acute respiratory failure with hypoxia: Secondary | ICD-10-CM | POA: Diagnosis not present

## 2020-02-13 DIAGNOSIS — R627 Adult failure to thrive: Secondary | ICD-10-CM

## 2020-02-13 LAB — CBC
HCT: 27.1 % — ABNORMAL LOW (ref 36.0–46.0)
Hemoglobin: 8.3 g/dL — ABNORMAL LOW (ref 12.0–15.0)
MCH: 29 pg (ref 26.0–34.0)
MCHC: 30.6 g/dL (ref 30.0–36.0)
MCV: 94.8 fL (ref 80.0–100.0)
Platelets: 77 10*3/uL — ABNORMAL LOW (ref 150–400)
RBC: 2.86 MIL/uL — ABNORMAL LOW (ref 3.87–5.11)
RDW: 15.8 % — ABNORMAL HIGH (ref 11.5–15.5)
WBC: 14.6 10*3/uL — ABNORMAL HIGH (ref 4.0–10.5)
nRBC: 0.7 % — ABNORMAL HIGH (ref 0.0–0.2)

## 2020-02-13 LAB — HEPATIC FUNCTION PANEL
ALT: 682 U/L — ABNORMAL HIGH (ref 0–44)
AST: 750 U/L — ABNORMAL HIGH (ref 15–41)
Albumin: 2 g/dL — ABNORMAL LOW (ref 3.5–5.0)
Alkaline Phosphatase: 150 U/L — ABNORMAL HIGH (ref 38–126)
Bilirubin, Direct: 0.6 mg/dL — ABNORMAL HIGH (ref 0.0–0.2)
Indirect Bilirubin: 0.9 mg/dL (ref 0.3–0.9)
Total Bilirubin: 1.5 mg/dL — ABNORMAL HIGH (ref 0.3–1.2)
Total Protein: 5.8 g/dL — ABNORMAL LOW (ref 6.5–8.1)

## 2020-02-13 LAB — GLUCOSE, CAPILLARY
Glucose-Capillary: 118 mg/dL — ABNORMAL HIGH (ref 70–99)
Glucose-Capillary: 107 mg/dL — ABNORMAL HIGH (ref 70–99)
Glucose-Capillary: 98 mg/dL (ref 70–99)

## 2020-02-13 LAB — BASIC METABOLIC PANEL
Anion gap: 15 (ref 5–15)
BUN: 18 mg/dL (ref 8–23)
CO2: 23 mmol/L (ref 22–32)
Calcium: 7.3 mg/dL — ABNORMAL LOW (ref 8.9–10.3)
Chloride: 108 mmol/L (ref 98–111)
Creatinine, Ser: 1.26 mg/dL — ABNORMAL HIGH (ref 0.44–1.00)
GFR, Estimated: 40 mL/min — ABNORMAL LOW (ref >60)
Glucose, Bld: 141 mg/dL — ABNORMAL HIGH (ref 70–99)
Potassium: 2.9 mmol/L — ABNORMAL LOW (ref 3.5–5.1)
Sodium: 146 mmol/L — ABNORMAL HIGH (ref 135–145)

## 2020-02-13 LAB — MAGNESIUM: Magnesium: 2 mg/dL (ref 1.7–2.4)

## 2020-02-13 LAB — URINE CULTURE: Culture: >=100000 — AB

## 2020-02-13 LAB — PROTIME-INR
INR: 2.3 — ABNORMAL HIGH (ref 0.8–1.2)
Prothrombin Time: 24.4 seconds — ABNORMAL HIGH (ref 11.4–15.2)

## 2020-02-13 MED ORDER — SODIUM CHLORIDE 0.9% FLUSH
10.0000 mL | Freq: Two times a day (BID) | INTRAVENOUS | Status: DC
  Administered 2020-02-13: 10 mL

## 2020-02-13 MED ORDER — METHYLPREDNISOLONE 8 MG PO TABS: 8.0000 mg | ORAL_TABLET | Freq: Two times a day (BID) | ORAL | 0 refills | Status: DC

## 2020-02-13 MED ORDER — HYDROCORTISONE NA SUCCINATE PF 100 MG IJ SOLR: 50.0000 mg | Freq: Two times a day (BID) | INTRAMUSCULAR | Status: DC

## 2020-02-13 MED ORDER — METOPROLOL TARTRATE 25 MG PO TABS: 12.5000 mg | ORAL_TABLET | Freq: Two times a day (BID) | ORAL | 0 refills | Status: AC

## 2020-02-13 MED ORDER — FUROSEMIDE 10 MG/ML IJ SOLN
20.0000 mg | Freq: Once | INTRAMUSCULAR | Status: DC
  Filled 2020-02-13: qty 2

## 2020-02-13 MED ORDER — METOLAZONE 2.5 MG PO TABS: 5.0000 mg | ORAL_TABLET | Freq: Once | ORAL | Status: DC

## 2020-02-13 MED ORDER — SODIUM CHLORIDE 0.9% FLUSH: 10.0000 mL | INTRAVENOUS | Status: DC | PRN

## 2020-02-13 MED ORDER — ALBUMIN HUMAN 25 % IV SOLN: 25.0000 g | Freq: Four times a day (QID) | INTRAVENOUS | Status: DC

## 2020-02-13 MED ORDER — CEFDINIR 300 MG PO CAPS: 300.0000 mg | ORAL_CAPSULE | Freq: Every day | ORAL | 0 refills | Status: DC

## 2020-02-13 NOTE — TOC Transition Note (Addendum)
Transition of Care [facility]) - CM/SW Discharge Note   Patient Details  Name: Robin Moreno MRN: [id number] Date of Birth: January 19, 1926  [facility] CM/SW Contact:  Lawerance Sabal, RN Phone Number: 02/13/2020, 11:23 AM   Clinical Narrative:   Terminal extubation scheduled for now.  Spoke w MD, CM will arrange transport via [facility] at 3pm. Son [name] will meet O2 privder at home at 1:00. [facility] w [facility] ordering O2, and aware of DC to home today.  Discussed obtaining DNR form with Dr Cleaster Corin.    Final next level of care: Home w Hospice Care Barriers to Discharge: No Barriers Identified   Patient Goals and CMS Choice Patient states their goals for this hospitalization and ongoing recovery are:: to go home to die CMS Medicare.gov Compare Post Acute Care list provided to:: Other (Comment Required) Choice offered to / list presented to : Adult Children  Discharge Placement                       Discharge Plan and Services                DME Arranged: Oxygen       Representative spoke with at DME Agency: [facility] w [facility]            Social Determinants of Health (SDOH) Interventions     Readmission Risk Interventions No flowsheet data found.

## 2020-02-13 NOTE — Progress Notes (Addendum)
02/13/2020 I saw and evaluated the patient. Discussed with resident and agree with resident's findings and plan as documented in the resident's note.  I have seen and evaluated the patient for shock and multiorgan dysfunction.  S:  Waking up this AM, on wean.  O: Blood pressure 124/71, pulse 90, temperature (!) 97.5 F (36.4 C), temperature source Axillary, resp. rate (!) 27, height 5\' 6"  (1.676 m), weight 55.1 kg, SpO2 100 %.  More awake Lungs with crackles Ext 1+ edema +SEM, ext warm  Creatinine and liver indices improving  A:  Looking at her prior echo what ties everything together is urosepsis leading to dehydration and cardiogenic shock with mulitorgan failure from decompensated hypertrophic cardiomyopathy.  With aggressive hydration her Bps and end organ perfusion have improved.  P:  - Ceftriaxone x 7 days - SAT/SBT, extubate - Wean stress steroids - Clarify code status with family - Keep on the more hydrated side - Check limited echo - Disposition likely home with hospice   Patient critically ill due to ongoing shock, respiratory failure Interventions to address this today pressor weaning, ventilator weaning Risk of deterioration without these interventions is high  I personally spent 43 minutes providing critical care not including any separately billable procedures  MD [facility] 02/13/2020 9:30 AM Personal pager: [phone] If unanswered, please page CCM On-call: [phone]   02/13/2020 02/15/2020 MD     NAME:  Robin Moreno, MRN:  Robin Moreno, DOB:  [birth year], LOS: 3 ADMISSION DATE:  02/10/2020, CONSULTATION DATE:  02/10/2020 REFERRING MD:  Dr. 02/12/2020, CHIEF COMPLAINT:  Respiratory arrest    Brief History   86 year old female found down at home by family with ineffective respirations requiring bag mask ventilation.  Per report patient never lost pulses.  GCS 3 on admission  History of present illness   Robin Moreno is  a 86 year old female with a past medical history significant for stage IIIa CKD, hypertension, hyperlipidemia, osteoarthritis, prior CVA, and recent COVID-19 pneumonia who presented to the emergency department today after being found down by family.  On arrival to ED patient was being assisted with bag mask ventilation with GCS of 3 requiring emergent intubation for airway protection.  Never lost pulses.  History obtained from chart review and verbal report as patient is currently encephalopathic and intubated.  On arrival patient was seen bradycardic, hypotensive, and severely hypoxic.  Of note patient was recently been treated at this facility for similar presentation with agonal breathing's and respiratory arrest on multiple occasions.  Most recent admission was 827 - 8/30 at which time patient evaluated by palliative care and decision was made to discharge home with outpatient palliative follow-up, son declined hospice care at that time. PCCM consulted for further management and admission.   Past Medical History  Stage 3a CKD HTN HLD DDD CVA Arthritis  Significant Hospital Events   11/14 > Admitted post resp arrest   Consults:  Palliative  Procedures:  Intubated on arrival 11/14 [facility] 11/15   Significant Diagnostic Tests:    Micro Data:  Blood culture 11/14 > Urine culture 11/14 >  Antimicrobials:  Ceftriaxone 11/14  Vanc 11/15  Interim history/subjective:  Slightly more awake this morning, following some commands.   Objective   Blood pressure 124/71, pulse 90, temperature (!) 97.5 F (36.4 C), temperature source Oral, resp. rate (!) 27, height 5\' 6"  (1.676 m), weight 55.1 kg, SpO2 99 %.    Vent Mode: PRVC FiO2 (%):  [30 %-40 %] 30 %  Set Rate:  [34 bmp] 34 bmp Vt Set:  [470 mL] 470 mL PEEP:  [5 cmH20] 5 cmH20 Plateau Pressure:  [19 cmH20-21 cmH20] 21 cmH20   Intake/Output Summary (Last 24 hours) at 02/13/2020 0647 Last data filed at 02/13/2020 0200 Gross per 24 hour   Intake 782.93 ml  Output 750 ml  Net 32.93 ml   Filed Weights   02/10/20 1600 02/12/20 0406  Weight: 54 kg 55.1 kg    Examination: General: Chronically ill appearing very frail deconditioned elderly female lying in bed on mechanical ventilation, in NAD HEENT: ETT, MM pink/moist, PERRL Neuro: moving feet and shrugging shoulder to command, opens eyes to voice CV: s1s2 regular rate and rhythm, IV/VI systolic murmur PULM:  Mechanical breath sounds, no added breath sounds, breathing on PS/CPAP GI: soft, bowel sounds active in all 4 quadrants, non-tender, non-distended Extremities: warm/dry, +2 LE edema  Skin: no rashes or lesions   Resolved Hospital Problem list     Assessment & Plan:   Intubated s/p Respiratory arrest with profound hypoxia on arrival  At risk for anoxic encephalopathy. -Per report patient never lost pulses, had agonal breathing for several hours, high concern for possible anoxic injury  More interactive this morning, following commands and opening eyes to voice.  P: -cont. Pressor support for now, off levophed -breathing on PS/CPAP, plan for one way extubation.  -Head of bed elevated 30 degrees. Plateau pressures less than 30 cm H20.    -Ensure adequate pulmonary hygiene  -Follow cultures  -VAP bundle in place  -RASS goal 0  -prn versed and morphine  Septic shock with staph spp and strep bacteremia   -Cont. Ceftriaxone 2g -Palliative care consulted, appreciate assistance  -Continue pressor support for MAP goal > 65 - on vasopressin  -taper steroids   Lateral Ischemia Likely demand, troponin mildly elevated at admission. Has developed new systolic murmur during admission. Anticoagulated with INR 2.3 -cont. Asa 81 mg  -hold echo for now with plans for hospice after extubation   Coagulopathy Thrombocytopenia Secondary to shock liver failure. Plt 77 today  -s/p 2U FFP -SCDs  - add LFTs, INR  Failure to thrive  -Long standing history of progressive  decline P: See GOC discussion below   HX of stage 3a CKD per chart review  Hypernatremia Hypokalemia Minimal urine output. 750 cc o/n. Volume overloaded on exam.  P: -Follow renal function / urine output -Trend Bmet -Avoid nephrotoxins, ensure adequate renal perfusion  -5 mg metolazone once -replete K  Goals of Care: Patient more awake and interactive this morning, following commands and breathing well on PS. Discussed with son, and we will extubate today. If she is unable to support breathing we will plan to make her comfortable and not reintubate. Otherwise will plan for home hospice as he would like to take her home.    Best practice:  Diet: NPO Pain/Anxiety/Delirium protocol (if indicated): fentanyl, morphine VAP protocol (if indicated): In place  DVT prophylaxis: SCDs GI prophylaxis: PPI Glucose control: SSI Mobility: Bedrest  Code Status: LCV, no chest compressions  Family Communication: Son updated at bedside 11/17 Disposition: ICU  Labs   CBC: Recent Labs  Lab 02/10/20 1511 02/10/20 1544 02/10/20 2029 02/10/20 2323 02/11/20 0300 02/12/20 0500 02/13/20 0500  WBC 21.8*  --   --  31.6* 27.7* 19.5* 14.6*  NEUTROABS 16.8*  --   --   --   --   --   --   HGB 7.9*   < > 10.9*  10.2* 9.2* 8.5* 8.3*  HCT 28.2*   < > 32.0* 35.6* 31.4* 26.8* 27.1*  MCV 102.9*  --   --  99.7 97.8 93.1 94.8  PLT 146*  --   --  183 155 82* 77*   < > = values in this interval not displayed.    Basic Metabolic Panel: Recent Labs  Lab 02/10/20 1805 02/10/20 1805 02/10/20 2029 02/11/20 0300 02/11/20 1618 02/12/20 0343 02/12/20 2202 02/13/20 0500  NA 146*   < > 147* 143  --  145 140 146*  K 4.1   < > 4.1 4.0  --  2.8* 2.5* 2.9*  CL 115*  --   --  115*  --  101 102 108  CO2 16*  --   --  14*  --  24 23 23   GLUCOSE 203*  --   --  228*  --  170* 116* 141*  BUN 18  --   --  19  --  20 19 18   CREATININE 1.59*  --   --  1.55*  --  1.42* 1.21* 1.26*  CALCIUM 7.6*  --   --  6.7*  --   7.3* 6.9* 7.3*  MG  --   --   --  1.5* 1.9 2.0  --   --   PHOS  --   --   --  3.4  --   --   --   --    < > = values in this interval not displayed.   GFR: Estimated Creatinine Clearance: 24.8 mL/min (A) (by C-G formula based on SCr of 1.26 mg/dL (H)). Recent Labs  Lab 02/10/20 1511 02/10/20 1805 02/10/20 2323 02/11/20 0300 02/11/20 0550 02/11/20 1458 02/12/20 0500 02/13/20 0500  WBC   < >  --  31.6* 27.7*  --   --  19.5* 14.6*  LATICACIDVEN  --  8.6* 10.4*  --  10.5* >11.0*  --   --    < > = values in this interval not displayed.    Liver Function Tests: Recent Labs  Lab 02/10/20 1805 02/11/20 0300 02/12/20 0343  AST 94* 155* 1,558*  ALT 43 97* 775*  ALKPHOS 95 76 110  BILITOT 0.9 1.1 0.8  PROT 5.7* 4.5* 5.4*  ALBUMIN 1.8* 1.6* 2.0*   No results for input(s): LIPASE, AMYLASE in the last 168 hours. No results for input(s): AMMONIA in the last 168 hours.  ABG    Component Value Date/Time   PHART 7.409 02/10/2020 2029   PCO2ART 21.0 (L) 02/10/2020 2029   PO2ART 96 02/10/2020 2029   HCO3 13.3 (L) 02/10/2020 2029   TCO2 14 (L) 02/10/2020 2029   ACIDBASEDEF 10.0 (H) 02/10/2020 2029   O2SAT 98.0 02/10/2020 2029     Coagulation Profile: Recent Labs  Lab 02/10/20 2144 02/11/20 0300 02/12/20 0343  INR 2.8* 5.3* 2.3*    Cardiac Enzymes: No results for input(s): CKTOTAL, CKMB, CKMBINDEX, TROPONINI in the last 168 hours.  HbA1C: Hgb A1c MFr Bld  Date/Time Value Ref Range Status  11/18/2019 03:00 AM 5.6 4.8 - 5.6 % Final    Comment:    (NOTE) Pre diabetes:          5.7%-6.4%  Diabetes:              >6.4%  Glycemic control for   <7.0% adults with diabetes     CBG: Recent Labs  Lab 02/12/20 0756 02/12/20 1605 02/12/20 2137 02/12/20 2332 02/13/20 0330  GLUCAP 152*  115* 114* 108* 118*    Seawell, Jaimie A, DO 02/13/2020, 6:47 AM Pager: [phone]

## 2020-02-13 NOTE — Procedures (Signed)
Extubation Procedure Note  Patient Details:   Name: Robin Moreno DOB: May 23, 1925 MRN: [id number]   Airway Documentation:    Vent end date: 02/13/20 Vent end time: 1127   Evaluation  O2 sats: stable throughout Complications: No apparent complications Patient did tolerate procedure well. Bilateral Breath Sounds: Clear, Diminished   No   Patient was extubated to a 3L [place]. Cuff leak was heard. No stridor was noted. Patient tolerated well. RN, MD, and family at bedside during extubation.   Danella Maiers [facility] 02/13/2020, 11:28 AM

## 2020-02-13 NOTE — Discharge Summary (Addendum)
Physician Discharge Summary  Patient ID: Robin Moreno MRN: [id number] DOB/AGE: [id number] 84 y.o.  Admit date: 02/10/2020 Discharge date: 02/13/2020  Admission Diagnoses: Hypoxic Respiratory Failure   Septic Shock 2/2 Streptococcus Bacteremia Cardiogenic Shock 2/2 Decompensated Hypertrophic Cardiomyopathy  E. Coli and K. Oxytoca Urosepsis   Discharge Diagnoses:  Active Problems:   Respiratory arrest (HCC)   Acute respiratory failure with hypoxia (HCC)   Endotracheal tube present   Hypotension   Discharged Condition: tenuous  Hospital Course:  Robin Moreno is a 86 year old female with a past medical history significant for stage IIIa CKD, hypertension, hyperlipidemia, osteoarthritis, prior CVA, LVOT obstruction with cardiomyopathy and recent COVID-19 pneumonia who presented to the emergency department today after being found down by family.  On arrival to ED patient was being assisted with bag mask ventilation with GCS of 3 requiring emergent intubation for airway protection.  Never lost pulses.  History obtained from chart review and verbal report as patient is currently encephalopathic and intubated.  On arrival patient was seen bradycardic, hypotensive, and severely hypoxic.  Of note patient was recently been treated at this facility for similar presentation with agonal breathing's and respiratory arrest on multiple occasions.  Most recent admission was 827 - 8/30 at which time patient evaluated by palliative care and decision was made to discharge home with outpatient palliative follow-up, son declined hospice care at that time. PCCM consulted for further management and admission.   Patient developed worsening lactic acidosis and ECG changes with slightly elevated troponin in addition to liver failure secondary to septic and cardiogenic shock due to dehydration. Heparin was held for INR of 5, but she was started on asa. She was also found to have Streptococcus bacteremia and urine  positive for Klebsiella and E. Coli. She was started on vancomycin and ceftriaxone. Palliative care was consulted due to previous code status and hospice care. Patient's son decided to provide the patient several days to assess improvement in mentation. With fluids and antiobiotic treatment patient's labs and status improved. Son and family decided for one way extubation, which was successful, with plan to discharge to home hospice in order to keep her comfortable. She is currently stable and will finish 10 days of antibiotics and steroid taper. Diltiazem was stopped due to acute heart failure and metoprolol decreased to half dose.   Consults: palliative care  Significant Diagnostic Studies: microbiology: blood culture: positive for Group C Strep and urine culture: positive for Klebsiella oxytoca and E. Coli, troponin 63 > 73; Lactic acidosis >11  Treatments: IV hydration, antibiotics: vancomycin and ceftriaxone, anticoagulation: ASA and heparin, steroids: solu-cortef, respiratory therapy: ETT and procedures: internal jugular line  Disposition: Discharge disposition: 50-Hospice/Home       Discharge Instructions    Diet - low sodium heart healthy   Complete by: As directed    Discharge wound care:   Complete by: As directed    Apply Aquacel Advantage [place] # [phone]) to just inside the wound and cover with foam dressing. Change daily or as needed with soiling.   Increase activity slowly   Complete by: As directed      Allergies as of 02/13/2020   No Known Allergies     Medication List    STOP taking these medications   diltiazem 60 MG tablet Commonly known as: CARDIZEM   rosuvastatin 10 MG tablet Commonly known as: CRESTOR     TAKE these medications   acetaminophen 325 MG tablet Commonly known as: Tylenol Take 2 tablets (650 mg  total) by mouth every 6 (six) hours as needed.   albuterol 108 (90 Base) MCG/ACT inhaler Commonly known as: VENTOLIN HFA Inhale 1-2 puffs into  the lungs every 4 (four) hours as needed for wheezing or shortness of breath.   apixaban 5 MG Tabs tablet Commonly known as: ELIQUIS Please take 10 mg oral twice daily for next 5 days, then transition to 5 mg oral twice daily from 11/28/2019 What changed:   how much to take  how to take this  when to take this  additional instructions   baclofen 10 MG tablet Commonly known as: LIORESAL Take 10 mg by mouth 2 (two) times daily.   cefdinir 300 MG capsule Commonly known as: OMNICEF Take 1 capsule (300 mg total) by mouth daily.   fluticasone 50 MCG/ACT nasal spray Commonly known as: FLONASE Place 1 spray into both nostrils daily as needed for allergies or rhinitis.   gabapentin 100 MG capsule Commonly known as: Neurontin Take 1 capsule (100 mg total) by mouth 3 (three) times daily.   meclizine 12.5 MG tablet Commonly known as: ANTIVERT Take 1 tablet (12.5 mg total) by mouth 3 (three) times daily as needed for dizziness.   methylPREDNISolone 8 MG tablet Commonly known as: MEDROL Take 1 tablet (8 mg total) by mouth 2 (two) times daily.   metoprolol tartrate 25 MG tablet Commonly known as: LOPRESSOR Take 0.5 tablets (12.5 mg total) by mouth 2 (two) times daily. What changed: how much to take   pantoprazole 40 MG tablet Commonly known as: PROTONIX Take 1 tablet (40 mg total) by mouth daily.   PHENobarbital 64.8 MG tablet Commonly known as: LUMINAL Take 64.8 mg by mouth every 12 (twelve) hours as needed.   SYSTANE OP Place 1 drop into both eyes every morning.   VITAMIN C PO Take 1 tablet by mouth daily.   Vitamin D 50 MCG (2000 UT) tablet Take 2,000 Units by mouth daily.   ZINC PO Take 1 tablet by mouth daily.            Discharge Care Instructions  (From admission, onward)         Start     Ordered   02/13/20 0000  Discharge wound care:       Comments: Apply Aquacel Advantage [place] # [phone]) to just inside the wound and cover with foam dressing.  Change daily or as needed with soiling.   02/13/20 1347           Signed: Richmond Campbell A Emine Lopata 02/13/2020, 2:54 PM

## 2020-02-13 NOTE — Discharge Instructions (Signed)
Take Cefdinir 300 mg - take one tablet per day for 10 days  Methylprednisolone - take one tablet every 12 hours for two doses   Stop diltiazem until follow-up with hospice or primary care doctor Decrease metoprolol to 12.5 mg two times per day

## 2020-02-13 NOTE — Progress Notes (Signed)
eLink Physician-Brief Progress Note Patient Name: Robin Moreno DOB: June 03, 1925 MRN: [id number]   Date of Service  02/13/2020  HPI/Events of Note  Follow up HR mostly 110s with occasional increase to 130s  eICU Interventions  Continue K correction for now     Intervention Category Major Interventions: Arrhythmia - evaluation and management  Darl Pikes 02/13/2020, 1:35 AM

## 2020-02-13 NOTE — Progress Notes (Signed)
Daily Progress Note   Patient Name: Robin Moreno       Date: 02/13/2020 DOB: 1925-05-23  Age: 86 y.o. MRN#: [id number] Attending Physician: Candee Furbish, MD Primary Care Physician: Jilda Panda, MD Admit Date: 02/10/2020  Reason for Follow-up: continued [place] discussions  Subjective: Note patient was extubated earlier today. Received reports from primary RN. Patient has done well with extubation. She is off pressors. Neuro status remains poor and she does not follow commands.  Son has decided to take her home with hospice today. Transport has been arranged. I recommend to nursing to leave Foley in for EOL comfort.  I call son and let him know we are very glad the patient was able to be extubated and will be able to return home and pass comfortably at home when it is her time. Son expresses appreciation for palliative support.    Length of Stay: 3  Current Medications: Scheduled Meds:  . aspirin  81 mg Per Tube Daily  . chlorhexidine gluconate (MEDLINE KIT)  15 mL Mouth Rinse BID  . Chlorhexidine Gluconate Cloth  6 each Topical Q0600  . hydrocortisone sod succinate (SOLU-CORTEF) inj  50 mg Intravenous Q12H  . insulin aspart  0-9 Units Subcutaneous Q4H  . mouth rinse  15 mL Mouth Rinse 10 times per day  . metolazone  5 mg Per Tube Once  . mupirocin ointment  1 application Nasal BID  . polyethylene glycol  17 g Per Tube Daily  . senna-docusate  1 tablet Per Tube QHS  . sodium chloride flush  10-40 mL Intracatheter Q12H    Continuous Infusions: . cefTRIAXone (ROCEPHIN)  IV 2 g (02/13/20 0857)  . norepinephrine (LEVOPHED) Adult infusion 2 mcg/min (02/12/20 1600)  . vasopressin Stopped (02/13/20 0900)    PRN Meds: docusate, fentaNYL (SUBLIMAZE) injection, fentaNYL (SUBLIMAZE)  injection, midazolam, midazolam, morphine injection, ondansetron (ZOFRAN) IV, sodium chloride flush        Vital Signs: BP (!) 147/89   Pulse 98   Temp (!) 97.5 F (36.4 C) (Axillary)   Resp (!) 25   Ht 5' 6"  (1.676 m)   Wt 55.1 kg   SpO2 96%   BMI 19.61 kg/m  SpO2: SpO2: 96 % O2 Device: O2 Device: Nasal Cannula O2 Flow Rate: O2 Flow Rate (L/min): 3 L/min  Intake/output  summary:   Intake/Output Summary (Last 24 hours) at 02/13/2020 1448 Last data filed at 02/13/2020 1100 Gross per 24 hour  Intake 258.23 ml  Output 780 ml  Net -521.77 ml   LBM: Last BM Date: 02/12/20 Baseline Weight: Weight: 54 kg Most recent weight: Weight: 55.1 kg       Palliative Assessment/Data: PPS 10%      Palliative Care Assessment & Plan   HPI/Patient Profile:86 y.o.femalewith past medical history of CKD,hypertension, hyperlipidemia, osteoarthritis, prior CVA, and recent COVID-19 pneumoniapresented to the ED on 02/10/20 for AMS and unresponsiveness at home. This patient has been with [facility] hospice services for the last 6 weeks; however, son/Daryl requested full aggressive measures for patient on arrival to ED. Patient was intubated and had central line placed upon arrival.  PMT was involved in patient's previous admission in August 2021 for FTT and encephalopathy.  Of note, patient has had 4 admissions and 4 ED visits in the last 6 months. Her albumin on 02/10/20 was 1.8.  Assessment: - s/p respiratory arrest with significant hypoxia on arrival - at risk for anoxic encephalopathy - septic shock with multiorgan failure - metabolic encephalopathy - hx of stage 3 CKD - failure to thrive  Recommendations/Plan: DNR/DNI Discharge home with hospice Removed central line prior to discharge Continue Foley catheter for EOL comfort  Goals of Care and Additional Recommendations: Limitations on Scope of Treatment: Avoid Hospitalization  Prognosis:  < 2 weeks  Discharge  Planning: Home with Hospice  Care plan was discussed with nursing  Thank you for allowing the Palliative Medicine Team to assist in the care of this patient.   Total Time 15 minutes Prolonged Time Billed  no       Greater than 50%  of this time was spent counseling and coordinating care related to the above assessment and plan.  Lavena Bullion, NP  Please contact Palliative Medicine Team phone at [phone] for questions and concerns.

## 2020-02-13 NOTE — Progress Notes (Signed)
  Speech Language Pathology  Patient Details Name: KATEE WENTLAND MRN: [id number] DOB: [birth year] Today's Date: 02/13/2020 Time:  -     Pt extubated this morning and going home with hospice this afternoon. RN stated swallow assessment not needed at this time.                   [name] 02/13/2020, 3:11 PM   [name] [name] Face.Ed Nurse, children's [phone] Office [phone]

## 2020-02-14 LAB — CULTURE, BLOOD (ROUTINE X 2): Special Requests: ADEQUATE

## 2020-02-16 ENCOUNTER — Emergency Department (HOSPITAL_COMMUNITY)
Admission: EM | Admit: 2020-02-16 | Discharge: 2020-02-16 | Disposition: A | Discharge status: Hospice | Attending: Emergency Medicine | Admitting: Emergency Medicine

## 2020-02-16 ENCOUNTER — Emergency Department (HOSPITAL_COMMUNITY)

## 2020-02-16 ENCOUNTER — Encounter (HOSPITAL_COMMUNITY): Payer: Self-pay | Admitting: Emergency Medicine

## 2020-02-16 DIAGNOSIS — Z96649 Presence of unspecified artificial hip joint: Secondary | ICD-10-CM | POA: Insufficient documentation

## 2020-02-16 DIAGNOSIS — Z7901 Long term (current) use of anticoagulants: Secondary | ICD-10-CM | POA: Insufficient documentation

## 2020-02-16 DIAGNOSIS — J181 Lobar pneumonia, unspecified organism: Secondary | ICD-10-CM | POA: Insufficient documentation

## 2020-02-16 DIAGNOSIS — I129 Hypertensive chronic kidney disease with stage 1 through stage 4 chronic kidney disease, or unspecified chronic kidney disease: Secondary | ICD-10-CM | POA: Diagnosis not present

## 2020-02-16 DIAGNOSIS — J189 Pneumonia, unspecified organism: Secondary | ICD-10-CM

## 2020-02-16 DIAGNOSIS — N1831 Chronic kidney disease, stage 3a: Secondary | ICD-10-CM | POA: Diagnosis not present

## 2020-02-16 DIAGNOSIS — Z7951 Long term (current) use of inhaled steroids: Secondary | ICD-10-CM | POA: Diagnosis not present

## 2020-02-16 DIAGNOSIS — Z515 Encounter for palliative care: Secondary | ICD-10-CM | POA: Diagnosis not present

## 2020-02-16 DIAGNOSIS — R0602 Shortness of breath: Secondary | ICD-10-CM | POA: Diagnosis present

## 2020-02-16 HISTORY — DX: Encounter for palliative care: Z51.5

## 2020-02-16 HISTORY — DX: Unspecified dementia, unspecified severity, without behavioral disturbance, psychotic disturbance, mood disturbance, and anxiety: F03.90

## 2020-02-16 LAB — CULTURE, BLOOD (ROUTINE X 2)
Culture: NO GROWTH
Culture: NO GROWTH

## 2020-02-16 MED ORDER — METOPROLOL TARTRATE 25 MG PO TABS
12.5000 mg | ORAL_TABLET | Freq: Once | ORAL | Status: AC
  Administered 2020-02-16: 12.5 mg via ORAL
  Filled 2020-02-16: qty 1

## 2020-02-16 MED ORDER — SODIUM CHLORIDE 0.9 % IV SOLN
1.0000 g | Freq: Once | INTRAVENOUS | Status: AC
  Administered 2020-02-16: 1 g via INTRAVENOUS
  Filled 2020-02-16: qty 10

## 2020-02-16 NOTE — ED Provider Notes (Signed)
[facility]EMERGENCY DEPT Provider Note: Lowella Dell, MD, FACEP  CSN: [id number] MRN: [id number] ARRIVAL: 02/16/20 at 0250 ROOM: WA14/[facility]14   CHIEF COMPLAINT  Cough  Level 5 caveat: Dementia HISTORY OF PRESENT ILLNESS  02/16/20 3:14 AM Garry Heater Kuipers is a 86 y.o. female with dementia who was recently admitted for dependent respiratory failure.  She was extubated and discharged home on 02/13/2020 for home hospice care.  Discharge summary indicates the family understood that she would pass away at home.  She was discharged home on cefdinir and Medrol.  She returns this morning after her son reported a "phlegmy cough" and shortness of breath.  EMS found her to be in no distress, oxygen saturation 100% on 2 L nasal cannula (her baseline) and has not heard her cough during the entire transfer from home to the ED.  They note she was lying supine in bed which may have contributed to difficulty breathing but when they sat her up her breathing was unlabored.   Past Medical History:  Diagnosis Date  . Arthritis   . CVA (cerebral vascular accident) (HCC)   . Degenerative disk disease   . Dementia (HCC)   . Hospice care patient   . Hypercholesterolemia   . Hypertension   . Osteoarthritis of right knee   . Renal disorder   . Stage 3a chronic kidney disease (HCC) 09/01/2019    Past Surgical History:  Procedure Laterality Date  . ABDOMINAL HYSTERECTOMY    . TOTAL HIP ARTHROPLASTY      No family history on file.  Social History   Tobacco Use  . Smoking status: Never Smoker  . Smokeless tobacco: Never Used  Vaping Use  . Vaping Use: Never used  Substance Use Topics  . Alcohol use: No  . Drug use: No    Prior to Admission medications   Medication Sig Start Date End Date Taking? Authorizing Provider  acetaminophen (TYLENOL) 325 MG tablet Take 2 tablets (650 mg total) by mouth every 6 (six) hours as needed. 01/04/19   Derwood Kaplan, MD  albuterol (VENTOLIN HFA) 108 (90 Base) MCG/ACT  inhaler Inhale 1-2 puffs into the lungs every 4 (four) hours as needed for wheezing or shortness of breath.     [provider]  apixaban (ELIQUIS) 5 MG TABS tablet Please take 10 mg oral twice daily for next 5 days, then transition to 5 mg oral twice daily from 11/28/2019 Patient taking differently: Take 5 mg by mouth 2 (two) times daily.  11/22/19   Elgergawy, Leana Roe, MD  Ascorbic Acid (VITAMIN C PO) Take 1 tablet by mouth daily.    [provider]  baclofen (LIORESAL) 10 MG tablet Take 10 mg by mouth 2 (two) times daily. 11/16/19   [provider]  cefdinir (OMNICEF) 300 MG capsule Take 1 capsule (300 mg total) by mouth daily. 02/13/20   Seawell, Jaimie A, DO  Cholecalciferol (VITAMIN D) 2000 UNITS tablet Take 2,000 Units by mouth daily.      [provider]  fluticasone (FLONASE) 50 MCG/ACT nasal spray Place 1 spray into both nostrils daily as needed for allergies or rhinitis.  11/04/19   [provider]  gabapentin (NEURONTIN) 100 MG capsule Take 1 capsule (100 mg total) by mouth 3 (three) times daily. 09/02/19   Burnadette Pop, MD  meclizine (ANTIVERT) 12.5 MG tablet Take 1 tablet (12.5 mg total) by mouth 3 (three) times daily as needed for dizziness. 07/23/18   Pricilla Loveless, MD  methylPREDNISolone (MEDROL)  8 MG tablet Take 1 tablet (8 mg total) by mouth 2 (two) times daily. 02/13/20   Seawell, Jaimie A, DO  metoprolol tartrate (LOPRESSOR) 25 MG tablet Take 0.5 tablets (12.5 mg total) by mouth 2 (two) times daily. 02/13/20   Seawell, Jaimie A, DO  Multiple Vitamins-Minerals (ZINC PO) Take 1 tablet by mouth daily.    [provider]  pantoprazole (PROTONIX) 40 MG tablet Take 1 tablet (40 mg total) by mouth daily. 11/23/19   Elgergawy, Leana Roe, MD  PHENobarbital (LUMINAL) 64.8 MG tablet Take 64.8 mg by mouth every 12 (twelve) hours as needed. 01/08/20   [provider]  Polyethyl Glycol-Propyl Glycol (SYSTANE OP) Place 1 drop into both  eyes every morning.    [provider]    Allergies Patient has no known allergies.   REVIEW OF SYSTEMS  Negative except as noted here or in the History of Present Illness.   PHYSICAL EXAMINATION  Initial Vital Signs Blood pressure (!) 177/102, pulse 70, temperature (!) 97.4 F (36.3 C), temperature source Rectal, resp. rate 16, SpO2 100 %.  Examination General: Well-developed, well-nourished female in no acute distress; appearance consistent with age of record HENT: normocephalic; atraumatic Eyes: pupils equal, round and reactive to light; arcus senilis bilaterally Neck: supple Heart: regular rate and rhythm Lungs: clear to auscultation bilaterally Abdomen: soft; nondistended; nontender; bowel sounds present Extremities: No deformity; trace edema of lower legs Neurologic: Somnolent but will open eyes to stimuli; minimal movement Skin: Warm and dry   RESULTS  Summary of this visit's results, reviewed and interpreted by myself:   EKG Interpretation  Date/Time:    Ventricular Rate:    PR Interval:    QRS Duration:   QT Interval:    QTC Calculation:   R Axis:     Text Interpretation:        Laboratory Studies: No results found for this or any previous visit (from the past 24 hour(s)). Imaging Studies: DG Chest Port 1 View  Result Date: 02/16/2020 CLINICAL DATA:  Difficulty breathing. EXAM: PORTABLE CHEST 1 VIEW COMPARISON:  February 11, 2020 FINDINGS: The endotracheal tube, nasogastric tube and left internal jugular venous catheter seen on the prior study have been removed. Low lung volumes are seen which is likely, in part, secondary to the degree of patient inspiration. Mild, hazy atelectasis and/or early infiltrate is seen within the left lung base. There is no evidence of a pleural effusion or pneumothorax. The heart size and mediastinal contours are within normal limits. The visualized skeletal structures are unremarkable. IMPRESSION: Mild, hazy left  basilar atelectasis and/or early infiltrate. Electronically Signed   By: Aram Candela M.D.   On: 02/16/2020 04:00    ED COURSE and MDM  Nursing notes, initial and subsequent vitals signs, including pulse oximetry, reviewed and interpreted by myself.  Vitals:   02/16/20 0311 02/16/20 0314 02/16/20 0320  BP: (!) 177/102    Pulse: 70    Resp: 16    Temp:  (!) 97.4 F (36.3 C)   TempSrc:  Rectal   SpO2: 100%    Weight:   59 kg  Height:   5\' 6"  (1.676 m)   Medications  cefTRIAXone (ROCEPHIN) 1 g in sodium chloride 0.9 % 100 mL IVPB (has no administration in time range)    4:14 AM The patient's chest x-ray is consistent with pneumonia.  This is not surprising given her recent hospitalization for respiratory failure and intubation.  She is on an antibiotic at home (  cefdinir) and her son was advised that she must continue taking this.  We will give her a dose of IV Rocephin in the ED prior to discharge.  As she is a hospice patient with death anticipated, and oxygen saturation 100% and in no distress, I do not believe admission to the hospital is indicated.  PROCEDURES  Procedures   ED DIAGNOSES     ICD-10-CM   1. Community acquired pneumonia of left lower lobe of lung  J18.9   2. Hospice care patient  Z51.5        Paula Libra, MD 02/16/20 [id number]

## 2020-02-16 NOTE — ED Notes (Signed)
Pt paperwork given to [facility] staff

## 2020-02-16 NOTE — ED Notes (Signed)
[facility] called for pt transfer back home 

## 2020-02-16 NOTE — ED Triage Notes (Signed)
Pt came in via EMS from home. Pt is on palliative care and hospice. Her son states that she was having trouble breathing. She normally wears 2L at home. She is 100% on her normal 2L. Pt has hx of dementia

## 2020-05-01 ENCOUNTER — Encounter (HOSPITAL_COMMUNITY): Payer: Self-pay | Admitting: *Deleted

## 2020-05-01 ENCOUNTER — Emergency Department (HOSPITAL_COMMUNITY)

## 2020-05-01 ENCOUNTER — Observation Stay (HOSPITAL_COMMUNITY)
Admission: EM | Admit: 2020-05-01 | Discharge: 2020-05-02 | Disposition: A | Discharge status: Home / Self Care | Attending: Internal Medicine | Admitting: Internal Medicine

## 2020-05-01 DIAGNOSIS — R531 Weakness: Secondary | ICD-10-CM | POA: Diagnosis not present

## 2020-05-01 DIAGNOSIS — I10 Essential (primary) hypertension: Secondary | ICD-10-CM | POA: Diagnosis present

## 2020-05-01 DIAGNOSIS — I129 Hypertensive chronic kidney disease with stage 1 through stage 4 chronic kidney disease, or unspecified chronic kidney disease: Secondary | ICD-10-CM | POA: Diagnosis not present

## 2020-05-01 DIAGNOSIS — Z7901 Long term (current) use of anticoagulants: Secondary | ICD-10-CM | POA: Insufficient documentation

## 2020-05-01 DIAGNOSIS — Z79899 Other long term (current) drug therapy: Secondary | ICD-10-CM | POA: Insufficient documentation

## 2020-05-01 DIAGNOSIS — T83511A Infection and inflammatory reaction due to indwelling urethral catheter, initial encounter: Secondary | ICD-10-CM | POA: Insufficient documentation

## 2020-05-01 DIAGNOSIS — Z86711 Personal history of pulmonary embolism: Secondary | ICD-10-CM | POA: Insufficient documentation

## 2020-05-01 DIAGNOSIS — N189 Chronic kidney disease, unspecified: Secondary | ICD-10-CM | POA: Insufficient documentation

## 2020-05-01 DIAGNOSIS — I480 Paroxysmal atrial fibrillation: Secondary | ICD-10-CM | POA: Insufficient documentation

## 2020-05-01 DIAGNOSIS — G9349 Other encephalopathy: Secondary | ICD-10-CM | POA: Diagnosis not present

## 2020-05-01 DIAGNOSIS — Z87440 Personal history of urinary (tract) infections: Secondary | ICD-10-CM | POA: Diagnosis not present

## 2020-05-01 DIAGNOSIS — Z8674 Personal history of sudden cardiac arrest: Secondary | ICD-10-CM | POA: Insufficient documentation

## 2020-05-01 DIAGNOSIS — N390 Urinary tract infection, site not specified: Secondary | ICD-10-CM | POA: Insufficient documentation

## 2020-05-01 DIAGNOSIS — N3000 Acute cystitis without hematuria: Secondary | ICD-10-CM

## 2020-05-01 DIAGNOSIS — R4182 Altered mental status, unspecified: Secondary | ICD-10-CM | POA: Diagnosis present

## 2020-05-01 DIAGNOSIS — E785 Hyperlipidemia, unspecified: Secondary | ICD-10-CM | POA: Insufficient documentation

## 2020-05-01 DIAGNOSIS — Z20822 Contact with and (suspected) exposure to covid-19: Secondary | ICD-10-CM | POA: Diagnosis not present

## 2020-05-01 DIAGNOSIS — G9340 Encephalopathy, unspecified: Secondary | ICD-10-CM

## 2020-05-01 DIAGNOSIS — N3281 Overactive bladder: Secondary | ICD-10-CM | POA: Insufficient documentation

## 2020-05-01 LAB — COMPREHENSIVE METABOLIC PANEL
ALT: 10 U/L (ref 0–44)
AST: 25 U/L (ref 15–41)
Albumin: 2.1 g/dL — ABNORMAL LOW (ref 3.5–5.0)
Alkaline Phosphatase: 69 U/L (ref 38–126)
Anion gap: 8 (ref 5–15)
BUN: 9 mg/dL (ref 8–23)
CO2: 23 mmol/L (ref 22–32)
Calcium: 8.5 mg/dL — ABNORMAL LOW (ref 8.9–10.3)
Chloride: 105 mmol/L (ref 98–111)
Creatinine, Ser: 0.76 mg/dL (ref 0.44–1.00)
GFR, Estimated: >60 mL/min (ref >60)
Glucose, Bld: 95 mg/dL (ref 70–99)
Potassium: 4.5 mmol/L (ref 3.5–5.1)
Sodium: 136 mmol/L (ref 135–145)
Total Bilirubin: 0.6 mg/dL (ref 0.3–1.2)
Total Protein: 6.7 g/dL (ref 6.5–8.1)

## 2020-05-01 LAB — CBC WITH DIFFERENTIAL/PLATELET
Abs Immature Granulocytes: 0.03 10*3/uL (ref 0.00–0.07)
Basophils Absolute: 0 10*3/uL (ref 0.0–0.1)
Basophils Relative: 1 %
Eosinophils Absolute: 0.1 10*3/uL (ref 0.0–0.5)
Eosinophils Relative: 3 %
HCT: 30 % — ABNORMAL LOW (ref 36.0–46.0)
Hemoglobin: 9.1 g/dL — ABNORMAL LOW (ref 12.0–15.0)
Immature Granulocytes: 1 %
Lymphocytes Relative: 36 %
Lymphs Abs: 1.4 10*3/uL (ref 0.7–4.0)
MCH: 27.6 pg (ref 26.0–34.0)
MCHC: 30.3 g/dL (ref 30.0–36.0)
MCV: 90.9 fL (ref 80.0–100.0)
Monocytes Absolute: 0.1 10*3/uL (ref 0.1–1.0)
Monocytes Relative: 3 %
Neutro Abs: 2.2 10*3/uL (ref 1.7–7.7)
Neutrophils Relative %: 56 %
Platelets: 203 10*3/uL (ref 150–400)
RBC: 3.3 MIL/uL — ABNORMAL LOW (ref 3.87–5.11)
RDW: 16.8 % — ABNORMAL HIGH (ref 11.5–15.5)
WBC: 3.9 10*3/uL — ABNORMAL LOW (ref 4.0–10.5)
nRBC: 0 % (ref 0.0–0.2)

## 2020-05-01 LAB — URINALYSIS, ROUTINE W REFLEX MICROSCOPIC
Bilirubin Urine: NEGATIVE
Glucose, UA: NEGATIVE mg/dL
Hgb urine dipstick: NEGATIVE
Ketones, ur: NEGATIVE mg/dL
Nitrite: NEGATIVE
Protein, ur: NEGATIVE mg/dL
Specific Gravity, Urine: 1.013 (ref 1.005–1.030)
WBC, UA: >50 WBC/hpf — ABNORMAL HIGH (ref 0–5)
pH: 5 (ref 5.0–8.0)

## 2020-05-01 LAB — PROTIME-INR
INR: 2.1 — ABNORMAL HIGH (ref 0.8–1.2)
Prothrombin Time: 22.8 seconds — ABNORMAL HIGH (ref 11.4–15.2)

## 2020-05-01 LAB — LACTIC ACID, PLASMA: Lactic Acid, Venous: 0.9 mmol/L (ref 0.5–1.9)

## 2020-05-01 LAB — APTT: aPTT: 44 seconds — ABNORMAL HIGH (ref 24–36)

## 2020-05-01 MED ORDER — LACTATED RINGERS IV BOLUS (SEPSIS)
1000.0000 mL | Freq: Once | INTRAVENOUS | Status: AC
  Administered 2020-05-01: 1000 mL via INTRAVENOUS

## 2020-05-01 MED ORDER — ACETAMINOPHEN 325 MG PO TABS: 650.0000 mg | ORAL_TABLET | Freq: Four times a day (QID) | ORAL | Status: DC | PRN

## 2020-05-01 MED ORDER — ROSUVASTATIN CALCIUM 10 MG PO TABS
10.0000 mg | ORAL_TABLET | Freq: Every day | ORAL | Status: DC
  Administered 2020-05-02: 10 mg via ORAL
  Filled 2020-05-01: qty 1

## 2020-05-01 MED ORDER — SODIUM CHLORIDE 0.9 % IV SOLN
2.0000 g | INTRAVENOUS | Status: DC
  Administered 2020-05-02: 2 g via INTRAVENOUS
  Filled 2020-05-01: qty 20

## 2020-05-01 MED ORDER — ACETAMINOPHEN 650 MG RE SUPP: 650.0000 mg | Freq: Four times a day (QID) | RECTAL | Status: DC | PRN

## 2020-05-01 MED ORDER — APIXABAN 5 MG PO TABS
5.0000 mg | ORAL_TABLET | Freq: Two times a day (BID) | ORAL | Status: DC
  Administered 2020-05-02 (×2): 5 mg via ORAL
  Filled 2020-05-01 (×2): qty 1

## 2020-05-01 MED ORDER — GABAPENTIN 100 MG PO CAPS
100.0000 mg | ORAL_CAPSULE | Freq: Three times a day (TID) | ORAL | Status: DC
  Administered 2020-05-02: 100 mg via ORAL
  Filled 2020-05-01: qty 1

## 2020-05-01 MED ORDER — OXYBUTYNIN CHLORIDE ER 5 MG PO TB24
5.0000 mg | ORAL_TABLET | Freq: Two times a day (BID) | ORAL | Status: DC
  Administered 2020-05-02: 5 mg via ORAL
  Filled 2020-05-01 (×3): qty 1

## 2020-05-01 MED ORDER — LACTATED RINGERS IV SOLN: INTRAVENOUS | Status: DC

## 2020-05-01 MED ORDER — PANTOPRAZOLE SODIUM 40 MG PO TBEC
40.0000 mg | DELAYED_RELEASE_TABLET | Freq: Every day | ORAL | Status: DC
  Administered 2020-05-02: 40 mg via ORAL
  Filled 2020-05-01: qty 1

## 2020-05-01 NOTE — H&P (Signed)
History and Physical    Robin Moreno SWN:[id number] DOB: 11-Mar-1926 DOA: 05/01/2020  PCP: Ralene Ok, MD  Patient coming from: Home.  History obtained from patient's son.  Chief Complaint: Altered mental status.  HPI: Robin Moreno is a 87 y.o. female with history of previous CVA, A. fib, pulmonary embolism hyperlipidemia was brought to the ER after patient became less responsive since this morning.  As per the patient's son who takes care of the patient patient woke up and had a morning medication including gabapentin and metoprolol following which patient became more drowsy and less responsive as the day went.  Did not have any nausea vomiting or diarrhea or any chest pain or shortness of breath.  ED Course: In the ER patient's temperature 100.5 F with initial blood pressure in the low normal's.  Lactic acid was normal UA is consistent with UTI.  Patient was given fluid bolus and started on ceftriaxone following which patient became more alert and started following commands.  Admit for further observation.  Review of Systems: As per HPI, rest all negative.   Past Medical History:  Diagnosis Date  . Arthritis   . CVA (cerebral vascular accident) (HCC)   . Degenerative disk disease   . Dementia (HCC)   . Hospice care patient   . Hypercholesterolemia   . Hypertension   . Osteoarthritis of right knee   . Renal disorder   . Stage 3a chronic kidney disease (HCC) 09/01/2019    Past Surgical History:  Procedure Laterality Date  . ABDOMINAL HYSTERECTOMY    . TOTAL HIP ARTHROPLASTY       reports that she has never smoked. She has never used smokeless tobacco. She reports that she does not drink alcohol and does not use drugs.  No Known Allergies  Family History  Family history unknown: Yes    Prior to Admission medications   Medication Sig Start Date End Date Taking? Authorizing Provider  albuterol (VENTOLIN HFA) 108 (90 Base) MCG/ACT inhaler Inhale 1-2 puffs into the lungs  every 4 (four) hours as needed for wheezing or shortness of breath.    Yes [provider]  apixaban (ELIQUIS) 5 MG TABS tablet Please take 10 mg oral twice daily for next 5 days, then transition to 5 mg oral twice daily from 11/28/2019 Patient taking differently: Take 5 mg by mouth 2 (two) times daily. 11/22/19  Yes Elgergawy, Leana Roe, MD  Ascorbic Acid (VITAMIN C PO) Take 1 tablet by mouth daily.   Yes [provider]  Cholecalciferol (VITAMIN D) 2000 UNITS tablet Take 2,000 Units by mouth daily.   Yes [provider]  gabapentin (NEURONTIN) 100 MG capsule Take 1 capsule (100 mg total) by mouth 3 (three) times daily. 09/02/19  Yes Burnadette Pop, MD  meclizine (ANTIVERT) 12.5 MG tablet Take 1 tablet (12.5 mg total) by mouth 3 (three) times daily as needed for dizziness. 07/23/18  Yes Pricilla Loveless, MD  metoprolol tartrate (LOPRESSOR) 25 MG tablet Take 0.5 tablets (12.5 mg total) by mouth 2 (two) times daily. Patient taking differently: Take 25 mg by mouth 2 (two) times daily. 02/13/20  Yes Seawell, Jaimie A, DO  morphine (ROXANOL) 20 MG/ML concentrated solution Take 10 mg by mouth daily as needed for severe pain.   Yes [provider]  morphine 20 MG/5ML solution Take by mouth every 2 (two) hours as needed for pain.   Yes [provider]  Multiple Vitamins-Minerals (ZINC PO) Take 1 tablet by mouth  daily.   Yes [provider]  oxybutynin (DITROPAN-XL) 5 MG 24 hr tablet Take 5 mg by mouth 2 (two) times daily.   Yes [provider]  pantoprazole (PROTONIX) 40 MG tablet Take 1 tablet (40 mg total) by mouth daily. 11/23/19  Yes Elgergawy, Leana Roe, MD  PHENobarbital (LUMINAL) 64.8 MG tablet Take 64.8 mg by mouth every 12 (twelve) hours as needed (anxiety). 01/08/20  Yes [provider]  Polyethyl Glycol-Propyl Glycol (SYSTANE OP) Place 1 drop into both eyes every morning.   Yes [provider]  rosuvastatin (CRESTOR) 10 MG  tablet Take 10 mg by mouth daily.   Yes [provider]  sulfamethoxazole-trimethoprim (BACTRIM DS) 800-160 MG tablet Take 1 tablet by mouth 2 (two) times daily. Start date : 04/28/20   Yes [provider]  acetaminophen (TYLENOL) 325 MG tablet Take 2 tablets (650 mg total) by mouth every 6 (six) hours as needed. Patient not taking: No sig reported 01/04/19   Derwood Kaplan, MD  cefdinir (OMNICEF) 300 MG capsule Take 1 capsule (300 mg total) by mouth daily. Patient not taking: No sig reported 02/13/20   Seawell, Jaimie A, DO  methylPREDNISolone (MEDROL) 8 MG tablet Take 1 tablet (8 mg total) by mouth 2 (two) times daily. Patient not taking: No sig reported 02/13/20   Versie Starks, DO    Physical Exam: Constitutional: Moderately built and nourished. Vitals:   05/01/20 2200 05/01/20 2215 05/01/20 2230 05/01/20 2245  BP: (!) 110/58 117/61 113/63 121/81  Pulse:      Resp: 18 (!) 24 (!) 26 (!) 25  Temp:      TempSrc:      SpO2:       Eyes: Anicteric no pallor. ENMT: No discharge from the ears eyes nose or mouth. Neck: No mass felt.  No neck rigidity. Respiratory: No rhonchi or crepitations. Cardiovascular: S1-S2 heard. Abdomen: Soft nontender bowel sounds present. Musculoskeletal: No edema. Skin: No rash. Neurologic: Alert awake responds to her name pupils equal and reactive light. Psychiatric: Responds to name and alert awake.   Labs on Admission: I have personally reviewed following labs and imaging studies  CBC: Recent Labs  Lab 05/01/20 1903  WBC 3.9*  NEUTROABS 2.2  HGB 9.1*  HCT 30.0*  MCV 90.9  PLT 203   Basic Metabolic Panel: Recent Labs  Lab 05/01/20 1903  NA 136  K 4.5  CL 105  CO2 23  GLUCOSE 95  BUN 9  CREATININE 0.76  CALCIUM 8.5*   GFR: CrCl cannot be calculated (Unknown ideal weight.). Liver Function Tests: Recent Labs  Lab 05/01/20 1903  AST 25  ALT 10  ALKPHOS 69  BILITOT 0.6  PROT 6.7  ALBUMIN 2.1*   No  results for input(s): LIPASE, AMYLASE in the last 168 hours. No results for input(s): AMMONIA in the last 168 hours. Coagulation Profile: Recent Labs  Lab 05/01/20 1903  INR 2.1*   Cardiac Enzymes: No results for input(s): CKTOTAL, CKMB, CKMBINDEX, TROPONINI in the last 168 hours. BNP (last 3 results) No results for input(s): PROBNP in the last 8760 hours. HbA1C: No results for input(s): HGBA1C in the last 72 hours. CBG: No results for input(s): GLUCAP in the last 168 hours. Lipid Profile: No results for input(s): CHOL, HDL, LDLCALC, TRIG, CHOLHDL, LDLDIRECT in the last 72 hours. Thyroid Function Tests: No results for input(s): TSH, T4TOTAL, FREET4, T3FREE, THYROIDAB in the last 72 hours. Anemia Panel: No results for input(s): VITAMINB12, FOLATE, FERRITIN, TIBC,  IRON, RETICCTPCT in the last 72 hours. Urine analysis:    Component Value Date/Time   COLORURINE YELLOW 05/01/2020 1903   APPEARANCEUR HAZY (A) 05/01/2020 1903   LABSPEC 1.013 05/01/2020 1903   PHURINE 5.0 05/01/2020 1903   GLUCOSEU NEGATIVE 05/01/2020 1903   HGBUR NEGATIVE 05/01/2020 1903   BILIRUBINUR NEGATIVE 05/01/2020 1903   KETONESUR NEGATIVE 05/01/2020 1903   PROTEINUR NEGATIVE 05/01/2020 1903   NITRITE NEGATIVE 05/01/2020 1903   LEUKOCYTESUR LARGE (A) 05/01/2020 1903   Sepsis Labs: @LABRCNTIP (procalcitonin:4,lacticidven:4) )No results found for this or any previous visit (from the past 240 hour(s)).   Radiological Exams on Admission: DG Chest Port 1 View  Result Date: 05/01/2020 CLINICAL DATA:  Insert epic diagnosis and indication recent urinary tract infection. EXAM: PORTABLE CHEST 1 VIEW COMPARISON:  Radiograph 02/16/2020 FINDINGS: Patient is rotated. The heart is normal in size. Aortic atherosclerosis. No focal airspace disease. No pulmonary edema. No pleural fluid. No pneumothorax. Chronic change about the right shoulder. No acute osseous abnormalities are seen. IMPRESSION: No active disease. Aortic  Atherosclerosis (ICD10-I70.0). Electronically Signed   By: 02/18/2020 M.D.   On: 05/01/2020 20:15    EKG: Independently reviewed.  Normal sinus rhythm.  Assessment/Plan Principal Problem:   Acute encephalopathy Active Problems:   Urinary tract infection   Essential hypertension   Weakness   PAF (paroxysmal atrial fibrillation) (HCC)    1. Acute metabolic encephalopathy likely from urine tract infection.  We will keep patient antibiotics follow cultures.  Get CT head. 2. History of A. fib presently rate controlled on Eliquis.  Takes metoprolol.  Continue if blood pressure improved. 3. Chronic anemia follow CBC. 4. Hyperlipidemia on statins.  Patient's son has rescinded her DNR and is full code now.   DVT prophylaxis: Eliquis. Code Status: Full code.  This is a change from previous. Family Communication: Patient's son. Disposition Plan: Home when stable. Consults called: Palliative care. Admission status: Observation.   06/29/2020 MD [facility] Pager [phone].  If 7PM-7AM, please contact night-coverage [email or web address] Password [email or web address]  05/01/2020, 11:14 PM

## 2020-05-01 NOTE — ED Notes (Signed)
Pt has multiple pressure ulcers on sacrum.

## 2020-05-01 NOTE — ED Triage Notes (Signed)
[facility] transferred pt from home and reports the following:  Pt son said pt has a recent hx of UTI and cardiac arrest. Her son lives with her. He said she is typically alert and speaks, but her level of consciousness is altered. He called EMS because starting this morning she was only responsive to pain. Hot to touch. No theromoter. DNR at bedside.

## 2020-05-01 NOTE — ED Provider Notes (Signed)
[facility]-EMERGENCY DEPT Provider Note   CSN: [id number] Arrival date & time: 05/01/20  1832     History CC: AMS, weakness  Robin Moreno is a 87 y.o. female.  HPI   Patient presented to the ED for evaluation decline in mental status.  Patient has history of recent urinary tract infections as well as cardiac arrest.  Patient is currently DNR.  She lives with her son.  According to the EMS report typically the son states patient is alert and speaks.  However this morning the patient was only responsive to pain.  She has felt feverish.  Patient mumbles to me but is unable to answer any questions or provide any history.  Prior medical records reviewed.  Patient last admitted to the hospital in November.  Patient is DNr and has hospice following her  Past Medical History:  Diagnosis Date  . Arthritis   . CVA (cerebral vascular accident) (HCC)   . Degenerative disk disease   . Dementia (HCC)   . Hospice care patient   . Hypercholesterolemia   . Hypertension   . Osteoarthritis of right knee   . Renal disorder   . Stage 3a chronic kidney disease (HCC) 09/01/2019    Patient Active Problem List   Diagnosis Date Noted  . Acute respiratory failure with hypoxia (HCC)   . Endotracheal tube present   . Hypotension   . Failure to thrive in adult 11/24/2019  . Palliative care by specialist   . Goals of care, counseling/discussion   . Advanced directives, counseling/discussion   . DNI (do not intubate)   . Concern about end of life   . Encounter for hospice care discussion   . Somnolence 11/23/2019  . Pulmonary embolism on right (HCC) 11/20/2019  . Pressure injury of skin 11/18/2019  . Respiratory distress 11/17/2019  . Altered mental status   . Respiratory arrest (HCC)   . Facial twitching 09/01/2019  . Stage 3a chronic kidney disease (HCC) 09/01/2019  . DNR (do not resuscitate) 09/01/2019  . COVID-19 07/30/2019  . Essential hypertension 07/30/2019  .  Dyslipidemia 07/30/2019  . Pre-syncope 07/30/2019  . Viral gastroenteritis due to [place] agent 09/19/2018  . Acute kidney injury superimposed on CKD (HCC) 09/18/2018  . Diarrhea 09/18/2018  . Urinary tract infection 09/18/2018  . Generalized weakness 09/18/2018  . Arthritis 09/18/2018    Past Surgical History:  Procedure Laterality Date  . ABDOMINAL HYSTERECTOMY    . TOTAL HIP ARTHROPLASTY       OB History   No obstetric history on file.     No family history on file.  Social History   Tobacco Use  . Smoking status: Never Smoker  . Smokeless tobacco: Never Used  Vaping Use  . Vaping Use: Never used  Substance Use Topics  . Alcohol use: No  . Drug use: No    Home Medications Prior to Admission medications   Medication Sig Start Date End Date Taking? Authorizing Provider  acetaminophen (TYLENOL) 325 MG tablet Take 2 tablets (650 mg total) by mouth every 6 (six) hours as needed. 01/04/19   Derwood Kaplan, MD  albuterol (VENTOLIN HFA) 108 (90 Base) MCG/ACT inhaler Inhale 1-2 puffs into the lungs every 4 (four) hours as needed for wheezing or shortness of breath.     [provider]  apixaban (ELIQUIS) 5 MG TABS tablet Please take 10 mg oral twice daily for next 5 days, then transition to 5 mg oral twice daily from 11/28/2019  Patient taking differently: Take 5 mg by mouth 2 (two) times daily.  11/22/19   Elgergawy, Leana Roe, MD  Ascorbic Acid (VITAMIN C PO) Take 1 tablet by mouth daily.    [provider]  baclofen (LIORESAL) 10 MG tablet Take 10 mg by mouth 2 (two) times daily. 11/16/19   [provider]  cefdinir (OMNICEF) 300 MG capsule Take 1 capsule (300 mg total) by mouth daily. 02/13/20   Seawell, Jaimie A, DO  Cholecalciferol (VITAMIN D) 2000 UNITS tablet Take 2,000 Units by mouth daily.      [provider]  fluticasone (FLONASE) 50 MCG/ACT nasal spray Place 1 spray into both nostrils daily as needed for allergies or rhinitis.  11/04/19    [provider]  gabapentin (NEURONTIN) 100 MG capsule Take 1 capsule (100 mg total) by mouth 3 (three) times daily. 09/02/19   Burnadette Pop, MD  meclizine (ANTIVERT) 12.5 MG tablet Take 1 tablet (12.5 mg total) by mouth 3 (three) times daily as needed for dizziness. 07/23/18   Pricilla Loveless, MD  methylPREDNISolone (MEDROL) 8 MG tablet Take 1 tablet (8 mg total) by mouth 2 (two) times daily. 02/13/20   Seawell, Jaimie A, DO  metoprolol tartrate (LOPRESSOR) 25 MG tablet Take 0.5 tablets (12.5 mg total) by mouth 2 (two) times daily. 02/13/20   Seawell, Jaimie A, DO  Multiple Vitamins-Minerals (ZINC PO) Take 1 tablet by mouth daily.    [provider]  pantoprazole (PROTONIX) 40 MG tablet Take 1 tablet (40 mg total) by mouth daily. 11/23/19   Elgergawy, Leana Roe, MD  PHENobarbital (LUMINAL) 64.8 MG tablet Take 64.8 mg by mouth every 12 (twelve) hours as needed. 01/08/20   [provider]  Polyethyl Glycol-Propyl Glycol (SYSTANE OP) Place 1 drop into both eyes every morning.    [provider]    Allergies    Patient has no known allergies.  Review of Systems   Review of Systems  Unable to perform ROS: Mental status change    Physical Exam Updated Vital Signs BP 125/60   Pulse 79   Temp (!) 100.5 F (38.1 C) (Rectal)   Resp (!) 26   SpO2 98%   Physical Exam Vitals and nursing note reviewed.  Constitutional:      Appearance: She is well-developed and well-nourished. She is ill-appearing.  HENT:     Head: Normocephalic and atraumatic.     Right Ear: External ear normal.     Left Ear: External ear normal.     Mouth/Throat:     Mouth: Mucous membranes are dry.  Eyes:     General: No scleral icterus.       Right eye: No discharge.        Left eye: No discharge.     Conjunctiva/sclera: Conjunctivae normal.  Neck:     Trachea: No tracheal deviation.  Cardiovascular:     Rate and Rhythm: Normal rate and regular rhythm.     Pulses: Intact  distal pulses.  Pulmonary:     Effort: Pulmonary effort is normal. No respiratory distress.     Breath sounds: Normal breath sounds. No stridor. No wheezing or rales.  Abdominal:     General: Bowel sounds are normal. There is no distension.     Palpations: Abdomen is soft.     Tenderness: There is no abdominal tenderness. There is no guarding or rebound.  Musculoskeletal:        General: No tenderness or edema.  Cervical back: Neck supple.     Comments: Decubitus ulcer on the sacrum, slight breakdown of skin, no involvement of fat, small decubitus on the left heel  Skin:    General: Skin is warm and dry.     Coloration: Skin is not jaundiced or pale.     Findings: No rash.  Neurological:     Mental Status: She is disoriented.     Cranial Nerves: No cranial nerve deficit (no facial droop, , mumbling speech).     Motor: No abnormal muscle tone or seizure activity.     Deep Tendon Reflexes: Strength normal.     Comments: Not following commands, does not lift arms or legs off the bed  Psychiatric:        Mood and Affect: Mood and affect normal.     ED Results / Procedures / Treatments   Labs (all labs ordered are listed, but only abnormal results are displayed) Labs Reviewed  COMPREHENSIVE METABOLIC PANEL - Abnormal; Notable for the following components:      Result Value   Calcium 8.5 (*)    Albumin 2.1 (*)    All other components within normal limits  CBC WITH DIFFERENTIAL/PLATELET - Abnormal; Notable for the following components:   WBC 3.9 (*)    RBC 3.30 (*)    Hemoglobin 9.1 (*)    HCT 30.0 (*)    RDW 16.8 (*)    All other components within normal limits  PROTIME-INR - Abnormal; Notable for the following components:   Prothrombin Time 22.8 (*)    INR 2.1 (*)    All other components within normal limits  APTT - Abnormal; Notable for the following components:   aPTT 44 (*)    All other components within normal limits  URINALYSIS, ROUTINE W REFLEX MICROSCOPIC -  Abnormal; Notable for the following components:   APPearance HAZY (*)    Leukocytes,Ua LARGE (*)    WBC, UA >50 (*)    Bacteria, UA RARE (*)    All other components within normal limits  URINE CULTURE  CULTURE, BLOOD (ROUTINE X 2)  CULTURE, BLOOD (ROUTINE X 2)  SARS CORONAVIRUS 2 (TAT 6-24 HRS)  LACTIC ACID, PLASMA  LACTIC ACID, PLASMA    EKG EKG Interpretation  Date/Time:  Thursday May 01 2020 19:13:08 EST Ventricular Rate:  77 PR Interval:    QRS Duration: 101 QT Interval:  378 QTC Calculation: 428 R Axis:   29 Text Interpretation: Sinus rhythm nonspecific t wave changes inferiorly No significant change since last tracing Confirmed by Linwood Dibbles [phone]) on 05/01/2020 8:10:28 PM   Radiology DG Chest Port 1 View  Result Date: 05/01/2020 CLINICAL DATA:  Insert epic diagnosis and indication recent urinary tract infection. EXAM: PORTABLE CHEST 1 VIEW COMPARISON:  Radiograph 02/16/2020 FINDINGS: Patient is rotated. The heart is normal in size. Aortic atherosclerosis. No focal airspace disease. No pulmonary edema. No pleural fluid. No pneumothorax. Chronic change about the right shoulder. No acute osseous abnormalities are seen. IMPRESSION: No active disease. Aortic Atherosclerosis (ICD10-I70.0). Electronically Signed   By: Narda Rutherford M.D.   On: 05/01/2020 20:15    Procedures Procedures   Medications Ordered in ED Medications  lactated ringers bolus 1,000 mL (0 mLs Intravenous Stopped 05/01/20 2126)    ED Course  I have reviewed the triage vital signs and the nursing notes.  Pertinent labs & imaging results that were available during my care of the patient were reviewed by me and considered in my medical  decision making (see chart for details).  Clinical Course as of 05/01/20 2130  Thu May 01, 2020  2017 Urinalysis suggestive of UTI. [JK]  2017   Urinalysis suggestive of UTI. [JK]  2017 CBC shows stable anemia [JK]  2042 Initial lactic acid level normal.  Metabolic  panel normal [JK]    Clinical Course User Index [JK] Linwood Dibbles, MD   MDM Rules/Calculators/A&P                          Patient presented to the emergency room for evaluation of weakness and fever.  Patient is currently living at home with her son.  She is on home hospice.  Patient developed a fever today up to 100.5.  She has had a decline in her mental status.  Patient improved with IV fluids however blood pressure still borderline at 94/57.  Have ordered IV fluids and antibiotics.  We will not proceed with aggressive care but I will consult the medical service for admission, palliative service. Final Clinical Impression(s) / ED Diagnoses Final diagnoses:  Acute cystitis without hematuria      Linwood Dibbles, MD 05/01/20 2131

## 2020-05-02 ENCOUNTER — Observation Stay (HOSPITAL_COMMUNITY)

## 2020-05-02 ENCOUNTER — Encounter (HOSPITAL_COMMUNITY): Payer: Self-pay | Admitting: Internal Medicine

## 2020-05-02 DIAGNOSIS — G9340 Encephalopathy, unspecified: Secondary | ICD-10-CM | POA: Diagnosis not present

## 2020-05-02 LAB — CBC WITH DIFFERENTIAL/PLATELET
Abs Immature Granulocytes: 0.03 10*3/uL (ref 0.00–0.07)
Basophils Absolute: 0 10*3/uL (ref 0.0–0.1)
Basophils Relative: 1 %
Eosinophils Absolute: 0.1 10*3/uL (ref 0.0–0.5)
Eosinophils Relative: 2 %
HCT: 30.1 % — ABNORMAL LOW (ref 36.0–46.0)
Hemoglobin: 9 g/dL — ABNORMAL LOW (ref 12.0–15.0)
Immature Granulocytes: 1 %
Lymphocytes Relative: 35 %
Lymphs Abs: 1.5 10*3/uL (ref 0.7–4.0)
MCH: 27.9 pg (ref 26.0–34.0)
MCHC: 29.9 g/dL — ABNORMAL LOW (ref 30.0–36.0)
MCV: 93.2 fL (ref 80.0–100.0)
Monocytes Absolute: 0.1 10*3/uL (ref 0.1–1.0)
Monocytes Relative: 3 %
Neutro Abs: 2.5 10*3/uL (ref 1.7–7.7)
Neutrophils Relative %: 58 %
Platelets: 190 10*3/uL (ref 150–400)
RBC: 3.23 MIL/uL — ABNORMAL LOW (ref 3.87–5.11)
RDW: 17 % — ABNORMAL HIGH (ref 11.5–15.5)
WBC: 4.2 10*3/uL (ref 4.0–10.5)
nRBC: 0 % (ref 0.0–0.2)

## 2020-05-02 LAB — COMPREHENSIVE METABOLIC PANEL
ALT: 10 U/L (ref 0–44)
AST: 30 U/L (ref 15–41)
Albumin: 2.2 g/dL — ABNORMAL LOW (ref 3.5–5.0)
Alkaline Phosphatase: 65 U/L (ref 38–126)
Anion gap: 12 (ref 5–15)
BUN: 10 mg/dL (ref 8–23)
CO2: 22 mmol/L (ref 22–32)
Calcium: 8.7 mg/dL — ABNORMAL LOW (ref 8.9–10.3)
Chloride: 101 mmol/L (ref 98–111)
Creatinine, Ser: 0.84 mg/dL (ref 0.44–1.00)
GFR, Estimated: >60 mL/min (ref >60)
Glucose, Bld: 91 mg/dL (ref 70–99)
Potassium: 4.9 mmol/L (ref 3.5–5.1)
Sodium: 135 mmol/L (ref 135–145)
Total Bilirubin: 0.6 mg/dL (ref 0.3–1.2)
Total Protein: 6.5 g/dL (ref 6.5–8.1)

## 2020-05-02 LAB — PHENOBARBITAL LEVEL: Phenobarbital: <5 ug/mL — ABNORMAL LOW (ref 15.0–30.0)

## 2020-05-02 LAB — SARS CORONAVIRUS 2 (TAT 6-24 HRS): SARS Coronavirus 2: NEGATIVE

## 2020-05-02 MED ORDER — SULFAMETHOXAZOLE-TRIMETHOPRIM 800-160 MG PO TABS: 1.0000 | ORAL_TABLET | Freq: Two times a day (BID) | ORAL | Status: DC

## 2020-05-02 MED ORDER — CEPHALEXIN 500 MG PO CAPS: 500.0000 mg | ORAL_CAPSULE | Freq: Three times a day (TID) | ORAL | 0 refills | Status: AC

## 2020-05-02 MED ORDER — CEPHALEXIN 500 MG PO CAPS
500.0000 mg | ORAL_CAPSULE | Freq: Three times a day (TID) | ORAL | Status: DC
  Administered 2020-05-02: 500 mg via ORAL
  Filled 2020-05-02: qty 1

## 2020-05-02 NOTE — ED Notes (Signed)
Son at bedside, verbalized understanding of dc innstructions. [facility] notified of transport, unknown ETA

## 2020-05-02 NOTE — Discharge Instructions (Signed)
Urinary Tract Infection, Adult  A urinary tract infection (UTI) is an infection of any part of the urinary tract. The urinary tract includes the kidneys, ureters, bladder, and urethra. These organs make, store, and get rid of urine in the body. An upper UTI affects the ureters and kidneys. A lower UTI affects the bladder and urethra. What are the causes? Most urinary tract infections are caused by bacteria in your genital area around your urethra, where urine leaves your body. These bacteria grow and cause inflammation of your urinary tract. What increases the risk? You are more likely to develop this condition if:  You have a urinary catheter that stays in place.  You are not able to control when you urinate or have a bowel movement (incontinence).  You are female and you: ? Use a spermicide or diaphragm for birth control. ? Have low estrogen levels. ? Are pregnant.  You have certain genes that increase your risk.  You are sexually active.  You take antibiotic medicines.  You have a condition that causes your flow of urine to slow down, such as: ? An enlarged prostate, if you are female. ? Blockage in your urethra. ? A kidney stone. ? A nerve condition that affects your bladder control (neurogenic bladder). ? Not getting enough to drink, or not urinating often.  You have certain medical conditions, such as: ? Diabetes. ? A weak disease-fighting system (immunesystem). ? Sickle cell disease. ? Gout. ? Spinal cord injury. What are the signs or symptoms? Symptoms of this condition include:  Needing to urinate right away (urgency).  Frequent urination. This may include small amounts of urine each time you urinate.  Pain or burning with urination.  Blood in the urine.  Urine that smells bad or unusual.  Trouble urinating.  Cloudy urine.  Vaginal discharge, if you are female.  Pain in the abdomen or the lower back. You may also have:  Vomiting or a decreased  appetite.  Confusion.  Irritability or tiredness.  A fever or chills.  Diarrhea. The first symptom in older adults may be confusion. In some cases, they may not have any symptoms until the infection has worsened. How is this diagnosed? This condition is diagnosed based on your medical history and a physical exam. You may also have other tests, including:  Urine tests.  Blood tests.  Tests for STIs (sexually transmitted infections). If you have had more than one UTI, a cystoscopy or imaging studies may be done to determine the cause of the infections. How is this treated? Treatment for this condition includes:  Antibiotic medicine.  Over-the-counter medicines to treat discomfort.  Drinking enough water to stay hydrated. If you have frequent infections or have other conditions such as a kidney stone, you may need to see a health care provider who specializes in the urinary tract (urologist). In rare cases, urinary tract infections can cause sepsis. Sepsis is a life-threatening condition that occurs when the body responds to an infection. Sepsis is treated in the hospital with IV antibiotics, fluids, and other medicines. Follow these instructions at home: Medicines  Take over-the-counter and prescription medicines only as told by your health care provider.  If you were prescribed an antibiotic medicine, take it as told by your health care provider. Do not stop using the antibiotic even if you start to feel better. General instructions  Make sure you: ? Empty your bladder often and completely. Do not hold urine for long periods of time. ? Empty your bladder after   sex. ? Wipe from front to back after urinating or having a bowel movement if you are female. Use each tissue only one time when you wipe.  Drink enough fluid to keep your urine pale yellow.  Keep all follow-up visits. This is important.   Contact a health care provider if:  Your symptoms do not get better after 1-2  days.  Your symptoms go away and then return. Get help right away if:  You have severe pain in your back or your lower abdomen.  You have a fever or chills.  You have nausea or vomiting. Summary  A urinary tract infection (UTI) is an infection of any part of the urinary tract, which includes the kidneys, ureters, bladder, and urethra.  Most urinary tract infections are caused by bacteria in your genital area.  Treatment for this condition often includes antibiotic medicines.  If you were prescribed an antibiotic medicine, take it as told by your health care provider. Do not stop using the antibiotic even if you start to feel better.  Keep all follow-up visits. This is important. This information is not intended to replace advice given to you by your health care provider. Make sure you discuss any questions you have with your health care provider. Document Revised: 10/26/2019 Document Reviewed: 10/26/2019 Elsevier Patient Education  2021 [employer]   Urinary Tract Infection, Adult A urinary tract infection (UTI) is an infection of any part of the urinary tract. The urinary tract includes:  The kidneys.  The ureters.  The bladder.  The urethra. These organs make, store, and get rid of pee (urine) in the body. What are the causes? This infection is caused by germs (bacteria) in your genital area. These germs grow and cause swelling (inflammation) of your urinary tract. What increases the risk? The following factors may make you more likely to develop this condition:  Using a small, thin tube (catheter) to drain pee.  Not being able to control when you pee or poop (incontinence).  Being female. If you are female, these things can increase the risk: ? Using these methods to prevent pregnancy:  A medicine that kills sperm (spermicide).  A device that blocks sperm (diaphragm). ? Having low levels of a female hormone (estrogen). ? Being pregnant. You are more likely  to develop this condition if:  You have genes that add to your risk.  You are sexually active.  You take antibiotic medicines.  You have trouble peeing because of: ? A prostate that is bigger than normal, if you are female. ? A blockage in the part of your body that drains pee from the bladder. ? A kidney stone. ? A nerve condition that affects your bladder. ? Not getting enough to drink. ? Not peeing often enough.  You have other conditions, such as: ? Diabetes. ? A weak disease-fighting system (immune system). ? Sickle cell disease. ? Gout. ? Injury of the spine. What are the signs or symptoms? Symptoms of this condition include:  Needing to pee right away.  Peeing small amounts often.  Pain or burning when peeing.  Blood in the pee.  Pee that smells bad or not like normal.  Trouble peeing.  Pee that is cloudy.  Fluid coming from the vagina, if you are female.  Pain in the belly or lower back. Other symptoms include:  Vomiting.  Not feeling hungry.  Feeling mixed up (confused). This may be the first symptom in older adults.  Being tired and grouchy (irritable).  A fever.  Watery poop (diarrhea). How is this treated?  Taking antibiotic medicine.  Taking other medicines.  Drinking enough water. In some cases, you may need to see a specialist. Follow these instructions at home: Medicines  Take over-the-counter and prescription medicines only as told by your doctor.  If you were prescribed an antibiotic medicine, take it as told by your doctor. Do not stop taking it even if you start to feel better. General instructions  Make sure you: ? Pee until your bladder is empty. ? Do not hold pee for a long time. ? Empty your bladder after sex. ? Wipe from front to back after peeing or pooping if you are a female. Use each tissue one time when you wipe.  Drink enough fluid to keep your pee pale yellow.  Keep all follow-up visits.   Contact a doctor  if:  You do not get better after 1-2 days.  Your symptoms go away and then come back. Get help right away if:  You have very bad back pain.  You have very bad pain in your lower belly.  You have a fever.  You have chills.  You feeling like you will vomit or you vomit. Summary  A urinary tract infection (UTI) is an infection of any part of the urinary tract.  This condition is caused by germs in your genital area.  There are many risk factors for a UTI.  Treatment includes antibiotic medicines.  Drink enough fluid to keep your pee pale yellow. This information is not intended to replace advice given to you by your health care provider. Make sure you discuss any questions you have with your health care provider. Document Revised: 10/26/2019 Document Reviewed: 10/26/2019 Elsevier Patient Education  2021 [employer].

## 2020-05-02 NOTE — Discharge Summary (Signed)
Discharge Summary  Robin Moreno:[id number] DOB: 04/20/1925  PCP: Ralene Ok, MD  Admit date: 05/01/2020 Discharge date: 05/02/2020  Time spent: 35 minutes  Recommendations for Outpatient Follow-up:  1. Follow-up with hospice care.  Discharge Diagnoses:  Active Hospital Problems   Diagnosis Date Noted  . Acute encephalopathy 05/01/2020  . Weakness 05/01/2020  . PAF (paroxysmal atrial fibrillation) (HCC) 05/01/2020  . Essential hypertension 07/30/2019  . Urinary tract infection 09/18/2018    Resolved Hospital Problems  No resolved problems to display.    Discharge Condition: Stable.  Diet recommendation: Resume previous diet.  Vitals:   05/02/20 1000 05/02/20 1117  BP: 124/65 116/63  Pulse: 76 78  Resp: (!) 21 (!) 23  Temp:    SpO2: 97% 97%    History of present illness:  HPI: Robin Moreno is a 87 y.o. female with history of previous CVA, A. fib, pulmonary embolism hyperlipidemia was brought to the ER after patient became less responsive since this morning.  As per the patient's son who takes care of the patient patient woke up and had a morning medication including gabapentin and metoprolol following which patient became more drowsy and less responsive as the day went.  Did not have any nausea vomiting or diarrhea or any chest pain or shortness of breath.  ED Course: In the ER patient's temperature 100.5 F with initial blood pressure in the low normal's.  Lactic acid was normal UA is consistent with UTI.  Patient was given fluid bolus and started on ceftriaxone following which patient became more alert and started following commands.  Admit for further observation.  05/02/20: Seen and examined at her bedside in the ED with her son present.  Per her son she is back to her baseline mentation.  Vital signs and labs reviewed and are stable.  He is eager to bring her home.  Based upon previous urine culture results started Keflex 500 mg 3 times daily.  Hospital Course:   Principal Problem:   Acute encephalopathy Active Problems:   Urinary tract infection   Essential hypertension   Weakness   PAF (paroxysmal atrial fibrillation) (HCC)  Resolved acute metabolic encephalopathy secondary to urinary tract infection in the setting of indwelling Foley catheter. Presented with lethargy, UA positive for pyuria CT head unremarkable for any acute intracranial findings. Received Rocephin and IV fluid with improvement of symptomatology. Currently back to her baseline mentation per her son at bedside. Continue to treat underlying condition  Presumed UTI in the setting of indwelling Foley catheter Presented with urine analysis positive for pyuria Urine culture in process She is much improved after starting Rocephin Based upon previous urine culture results started Keflex 500 mg 3 times daily, to complete 10 days total of antibiotics. Son is eager to take her home. Afebrile no leukocytosis.  Paroxysmal A. fib on Eliquis Rate is controlled Resume home regimen  GERD/hyperlipidemia/Overactive bladder/indwelling Foley catheter Resume home regimen  Hospice care Resume    Procedures:  None.  Consultations:  None.  Discharge Exam: BP 116/63   Pulse 78   Temp 98.5 F (36.9 C)   Resp (!) 23   SpO2 97%  . General: 87 y.o. year-old female chronically ill-appearing in no acute distress.  Alert. . Cardiovascular: Regular rate and rhythm with no rubs or gallops.  Marland Kitchen Respiratory: Clear to auscultation with no wheezes or rales.  . Abdomen: Soft nontender nondistended with normal bowel sounds x4 quadrants. . Musculoskeletal: No lower extremity edema bilaterally. Marland Kitchen Psychiatry: Mood  is appropriate for condition and setting  Discharge Instructions You were cared for by a hospitalist during your hospital stay. If you have any questions about your discharge medications or the care you received while you were in the hospital after you are discharged, you can call  the unit and asked to speak with the hospitalist on call if the hospitalist that took care of you is not available. Once you are discharged, your primary care physician will handle any further medical issues. Please note that NO REFILLS for any discharge medications will be authorized once you are discharged, as it is imperative that you return to your primary care physician (or establish a relationship with a primary care physician if you do not have one) for your aftercare needs so that they can reassess your need for medications and monitor your lab values.   Allergies as of 05/02/2020   No Known Allergies     Medication List    STOP taking these medications   acetaminophen 325 MG tablet Commonly known as: Tylenol   cefdinir 300 MG capsule Commonly known as: OMNICEF   methylPREDNISolone 8 MG tablet Commonly known as: MEDROL   sulfamethoxazole-trimethoprim 800-160 MG tablet Commonly known as: BACTRIM DS     TAKE these medications   albuterol 108 (90 Base) MCG/ACT inhaler Commonly known as: VENTOLIN HFA Inhale 1-2 puffs into the lungs every 4 (four) hours as needed for wheezing or shortness of breath.   apixaban 5 MG Tabs tablet Commonly known as: ELIQUIS Please take 10 mg oral twice daily for next 5 days, then transition to 5 mg oral twice daily from 11/28/2019 What changed:   how much to take  how to take this  when to take this  additional instructions   cephALEXin 500 MG capsule Commonly known as: KEFLEX Take 1 capsule (500 mg total) by mouth 3 (three) times daily for 9 days.   gabapentin 100 MG capsule Commonly known as: Neurontin Take 1 capsule (100 mg total) by mouth 3 (three) times daily.   meclizine 12.5 MG tablet Commonly known as: ANTIVERT Take 1 tablet (12.5 mg total) by mouth 3 (three) times daily as needed for dizziness.   metoprolol tartrate 25 MG tablet Commonly known as: LOPRESSOR Take 0.5 tablets (12.5 mg total) by mouth 2 (two) times daily. What  changed: how much to take   morphine 20 MG/5ML solution Take by mouth every 2 (two) hours as needed for pain.   morphine 20 MG/ML concentrated solution Commonly known as: ROXANOL Take 10 mg by mouth daily as needed for severe pain.   oxybutynin 5 MG 24 hr tablet Commonly known as: DITROPAN-XL Take 5 mg by mouth 2 (two) times daily.   pantoprazole 40 MG tablet Commonly known as: PROTONIX Take 1 tablet (40 mg total) by mouth daily.   PHENobarbital 64.8 MG tablet Commonly known as: LUMINAL Take 64.8 mg by mouth every 12 (twelve) hours as needed (anxiety).   rosuvastatin 10 MG tablet Commonly known as: CRESTOR Take 10 mg by mouth daily.   SYSTANE OP Place 1 drop into both eyes every morning.   VITAMIN C PO Take 1 tablet by mouth daily.   Vitamin D 50 MCG (2000 UT) tablet Take 2,000 Units by mouth daily.   ZINC PO Take 1 tablet by mouth daily.      No Known Allergies  Follow-up Information    Ralene Ok, MD. Call in 1 day(s).   Specialty: Internal Medicine Why: Please call for a  post hospital follow-up appointment. Contact information: [address] [place] [address] [phone]                The results of significant diagnostics from this hospitalization (including imaging, microbiology, ancillary and laboratory) are listed below for reference.    Significant Diagnostic Studies: CT HEAD WO CONTRAST  Result Date: 05/02/2020 CLINICAL DATA:  Mental status change EXAM: CT HEAD WITHOUT CONTRAST TECHNIQUE: Contiguous axial images were obtained from the base of the skull through the vertex without intravenous contrast. COMPARISON:  01/02/2020 FINDINGS: Brain: No evidence of acute infarction, hemorrhage, hydrocephalus, extra-axial collection or mass lesion/mass effect. Encephalomalacia of the posterior left frontal lobe consistent with remote infarct. Confluent periventricular white matter hypodensity is most likely the result of chronic ischemic small  vessel disease. Vascular: No hyperdense vessel or unexpected calcification. Skull: Normal. Negative for fracture or focal lesion. Sinuses/Orbits: No acute finding. Other: None. IMPRESSION: No acute intracranial abnormality. Electronically Signed   By: Acquanetta Belling M.D.   On: 05/02/2020 07:50   DG Chest Port 1 View  Result Date: 05/01/2020 CLINICAL DATA:  Insert epic diagnosis and indication recent urinary tract infection. EXAM: PORTABLE CHEST 1 VIEW COMPARISON:  Radiograph 02/16/2020 FINDINGS: Patient is rotated. The heart is normal in size. Aortic atherosclerosis. No focal airspace disease. No pulmonary edema. No pleural fluid. No pneumothorax. Chronic change about the right shoulder. No acute osseous abnormalities are seen. IMPRESSION: No active disease. Aortic Atherosclerosis (ICD10-I70.0). Electronically Signed   By: Narda Rutherford M.D.   On: 05/01/2020 20:15    Microbiology: Recent Results (from the past 240 hour(s))  SARS CORONAVIRUS 2 (TAT 6-24 HRS) Nasopharyngeal In/Out Cath Urine     Status: None   Collection Time: 05/01/20  7:03 PM   Specimen: In/Out Cath Urine; Nasopharyngeal  Result Value Ref Range Status   SARS Coronavirus 2 NEGATIVE NEGATIVE Final    Comment: (NOTE) SARS-CoV-2 target nucleic acids are NOT DETECTED.  The SARS-CoV-2 RNA is generally detectable in upper and lower respiratory specimens during the acute phase of infection. Negative results do not preclude SARS-CoV-2 infection, do not rule out co-infections with other pathogens, and should not be used as the sole basis for treatment or other patient management decisions. Negative results must be combined with clinical observations, patient history, and epidemiological information. The expected result is Negative.  Fact Sheet for Patients: HairSlick.no  Fact Sheet for Healthcare Providers: quierodirigir.com  This test is not yet approved or cleared by the  [place] FDA and  has been authorized for detection and/or diagnosis of SARS-CoV-2 by FDA under an Emergency Use Authorization (EUA). This EUA will remain  in effect (meaning this test can be used) for the duration of the COVID-19 declaration under Se ction 564(b)(1) of the Act, 21 U.S.C. section 360bbb-3(b)(1), unless the authorization is terminated or revoked sooner.  Performed at [facility], [address] [place], [place] [address]   Blood Culture (routine x 2)     Status: None (Preliminary result)   Collection Time: 05/01/20  7:08 PM   Specimen: BLOOD  Result Value Ref Range Status   Specimen Description   Final    BLOOD RIGHT ANTECUBITAL Performed at [facility], [address], [place], [place] [address]    Special Requests   Final    BOTTLES DRAWN AEROBIC AND ANAEROBIC Blood Culture adequate volume Performed at [facility], [address], [place], [place] [address]    Culture  Final    NO GROWTH < 12 HOURS Performed at [facility], [address] [place], [place] [address]    Report Status PENDING  Incomplete     Labs: Basic Metabolic Panel: Recent Labs  Lab 05/01/20 1903 05/02/20 0400  NA 136 135  K 4.5 4.9  CL 105 101  CO2 23 22  GLUCOSE 95 91  BUN 9 10  CREATININE 0.76 0.84  CALCIUM 8.5* 8.7*   Liver Function Tests: Recent Labs  Lab 05/01/20 1903 05/02/20 0400  AST 25 30  ALT 10 10  ALKPHOS 69 65  BILITOT 0.6 0.6  PROT 6.7 6.5  ALBUMIN 2.1* 2.2*   No results for input(s): LIPASE, AMYLASE in the last 168 hours. No results for input(s): AMMONIA in the last 168 hours. CBC: Recent Labs  Lab 05/01/20 1903 05/02/20 0400  WBC 3.9* 4.2  NEUTROABS 2.2 2.5  HGB 9.1* 9.0*  HCT 30.0* 30.1*  MCV 90.9 93.2  PLT 203 190   Cardiac Enzymes: No results for input(s): CKTOTAL, CKMB, CKMBINDEX, TROPONINI in the last 168 hours. BNP: BNP (last 3 results) Recent Labs    07/30/19 2255  11/21/19 0726 11/22/19 0214  BNP 30.9 343.3* 175.9*    ProBNP (last 3 results) No results for input(s): PROBNP in the last 8760 hours.  CBG: No results for input(s): GLUCAP in the last 168 hours.     Signed:  Darlin Drop, MD [facility] 05/02/2020, 12:20 PM

## 2020-05-03 LAB — URINE CULTURE

## 2020-05-06 LAB — CULTURE, BLOOD (ROUTINE X 2)
Culture: NO GROWTH
Culture: NO GROWTH
Special Requests: ADEQUATE
Special Requests: ADEQUATE

## 2020-07-03 ENCOUNTER — Emergency Department (HOSPITAL_COMMUNITY)

## 2020-07-03 ENCOUNTER — Emergency Department (HOSPITAL_COMMUNITY)
Admission: EM | Admit: 2020-07-03 | Discharge: 2020-07-04 | Disposition: A | Discharge status: Home / Self Care | Attending: Emergency Medicine | Admitting: Emergency Medicine

## 2020-07-03 DIAGNOSIS — R0602 Shortness of breath: Secondary | ICD-10-CM | POA: Diagnosis present

## 2020-07-03 DIAGNOSIS — I129 Hypertensive chronic kidney disease with stage 1 through stage 4 chronic kidney disease, or unspecified chronic kidney disease: Secondary | ICD-10-CM | POA: Diagnosis not present

## 2020-07-03 DIAGNOSIS — Z8673 Personal history of transient ischemic attack (TIA), and cerebral infarction without residual deficits: Secondary | ICD-10-CM | POA: Diagnosis not present

## 2020-07-03 DIAGNOSIS — F0390 Unspecified dementia without behavioral disturbance: Secondary | ICD-10-CM | POA: Insufficient documentation

## 2020-07-03 DIAGNOSIS — R8290 Unspecified abnormal findings in urine: Secondary | ICD-10-CM | POA: Diagnosis not present

## 2020-07-03 DIAGNOSIS — Z96649 Presence of unspecified artificial hip joint: Secondary | ICD-10-CM | POA: Insufficient documentation

## 2020-07-03 DIAGNOSIS — N1831 Chronic kidney disease, stage 3a: Secondary | ICD-10-CM | POA: Diagnosis not present

## 2020-07-03 DIAGNOSIS — Z7901 Long term (current) use of anticoagulants: Secondary | ICD-10-CM | POA: Insufficient documentation

## 2020-07-03 DIAGNOSIS — Z8616 Personal history of COVID-19: Secondary | ICD-10-CM | POA: Insufficient documentation

## 2020-07-03 DIAGNOSIS — Z79899 Other long term (current) drug therapy: Secondary | ICD-10-CM | POA: Insufficient documentation

## 2020-07-03 LAB — CBC WITH DIFFERENTIAL/PLATELET
Abs Immature Granulocytes: 0.06 10*3/uL (ref 0.00–0.07)
Basophils Absolute: 0 10*3/uL (ref 0.0–0.1)
Basophils Relative: 0 %
Eosinophils Absolute: 0 10*3/uL (ref 0.0–0.5)
Eosinophils Relative: 0 %
HCT: 36.9 % (ref 36.0–46.0)
Hemoglobin: 11 g/dL — ABNORMAL LOW (ref 12.0–15.0)
Immature Granulocytes: 1 %
Lymphocytes Relative: 12 %
Lymphs Abs: 1.3 10*3/uL (ref 0.7–4.0)
MCH: 29 pg (ref 26.0–34.0)
MCHC: 29.8 g/dL — ABNORMAL LOW (ref 30.0–36.0)
MCV: 97.4 fL (ref 80.0–100.0)
Monocytes Absolute: 0.9 10*3/uL (ref 0.1–1.0)
Monocytes Relative: 8 %
Neutro Abs: 8.2 10*3/uL — ABNORMAL HIGH (ref 1.7–7.7)
Neutrophils Relative %: 79 %
Platelets: 238 10*3/uL (ref 150–400)
RBC: 3.79 MIL/uL — ABNORMAL LOW (ref 3.87–5.11)
RDW: 16.4 % — ABNORMAL HIGH (ref 11.5–15.5)
WBC: 10.5 10*3/uL (ref 4.0–10.5)
nRBC: 0 % (ref 0.0–0.2)

## 2020-07-03 LAB — COMPREHENSIVE METABOLIC PANEL
ALT: 8 U/L (ref 0–44)
AST: 21 U/L (ref 15–41)
Albumin: 2.2 g/dL — ABNORMAL LOW (ref 3.5–5.0)
Alkaline Phosphatase: 71 U/L (ref 38–126)
Anion gap: 9 (ref 5–15)
BUN: 11 mg/dL (ref 8–23)
CO2: 23 mmol/L (ref 22–32)
Calcium: 8.8 mg/dL — ABNORMAL LOW (ref 8.9–10.3)
Chloride: 111 mmol/L (ref 98–111)
Creatinine, Ser: 0.69 mg/dL (ref 0.44–1.00)
GFR, Estimated: >60 mL/min (ref >60)
Glucose, Bld: 126 mg/dL — ABNORMAL HIGH (ref 70–99)
Potassium: 4.2 mmol/L (ref 3.5–5.1)
Sodium: 143 mmol/L (ref 135–145)
Total Bilirubin: 0.7 mg/dL (ref 0.3–1.2)
Total Protein: 6.4 g/dL — ABNORMAL LOW (ref 6.5–8.1)

## 2020-07-03 LAB — URINALYSIS, ROUTINE W REFLEX MICROSCOPIC
Bilirubin Urine: NEGATIVE
Glucose, UA: NEGATIVE mg/dL
Hgb urine dipstick: NEGATIVE
Ketones, ur: NEGATIVE mg/dL
Nitrite: NEGATIVE
Protein, ur: NEGATIVE mg/dL
RBC / HPF: >50 RBC/hpf — ABNORMAL HIGH (ref 0–5)
Specific Gravity, Urine: 1.014 (ref 1.005–1.030)
WBC, UA: >50 WBC/hpf — ABNORMAL HIGH (ref 0–5)
pH: 5 (ref 5.0–8.0)

## 2020-07-03 MED ORDER — CEPHALEXIN 500 MG PO CAPS: 500.0000 mg | ORAL_CAPSULE | Freq: Three times a day (TID) | ORAL | 0 refills | Status: AC

## 2020-07-03 MED ORDER — SODIUM CHLORIDE 0.9 % IV BOLUS
1000.0000 mL | Freq: Once | INTRAVENOUS | Status: AC
  Administered 2020-07-03: 1000 mL via INTRAVENOUS

## 2020-07-03 MED ORDER — FLUCONAZOLE 150 MG PO TABS
150.0000 mg | ORAL_TABLET | Freq: Once | ORAL | Status: AC
  Administered 2020-07-04: 150 mg via ORAL
  Filled 2020-07-03: qty 1

## 2020-07-03 MED ORDER — CEPHALEXIN 500 MG PO CAPS
500.0000 mg | ORAL_CAPSULE | Freq: Once | ORAL | Status: AC
  Administered 2020-07-04: 500 mg via ORAL
  Filled 2020-07-03: qty 1

## 2020-07-03 NOTE — ED Notes (Signed)
Upon multiple attempts, it does not appear that Robin Moreno is able to swallow water.

## 2020-07-03 NOTE — ED Notes (Signed)
PA at bedside for [place] IV. Attempted w/o success. RN also attempted peripheral w/o success. IV team consult will be placed

## 2020-07-03 NOTE — ED Provider Notes (Signed)
[facility]-EMERGENCY DEPT Provider Note   CSN: [id number] Arrival date & time: 07/03/20  1551     History Chief Complaint  Patient presents with  . Shortness of Breath    Robin Moreno is a 87 y.o. female.  HPI Patient is a 87 year old female past medical history detailed below significant for stroke with paresis and dementia, she is a hospice patient and has a DNR, has a history of HTN, CKD, DDD, arthritis  Patient is presented today with son at bedside primarily concerned about gurgling sound that she seems to have in her throat.  He states that she is on her home oxygen 24 to 6 L nasal cannula does not seem to be more short of breath or confused and does not seem to be in any significant pain.  Level 5 caveat due to dementia  Patient shakes her head no to chest pain, shortness of breath, abdominal pain or difficulty pain.  She does have a Foley in place however.     Past Medical History:  Diagnosis Date  . Arthritis   . CVA (cerebral vascular accident) (HCC)   . Degenerative disk disease   . Dementia (HCC)   . Hospice care patient   . Hypercholesterolemia   . Hypertension   . Osteoarthritis of right knee   . Renal disorder   . Stage 3a chronic kidney disease (HCC) 09/01/2019    Patient Active Problem List   Diagnosis Date Noted  . Weakness 05/01/2020  . Acute encephalopathy 05/01/2020  . PAF (paroxysmal atrial fibrillation) (HCC) 05/01/2020  . Acute respiratory failure with hypoxia (HCC)   . Endotracheal tube present   . Hypotension   . Failure to thrive in adult 11/24/2019  . Palliative care by specialist   . Goals of care, counseling/discussion   . Advanced directives, counseling/discussion   . DNI (do not intubate)   . Concern about end of life   . Encounter for hospice care discussion   . Somnolence 11/23/2019  . Pulmonary embolism on right (HCC) 11/20/2019  . Pressure injury of skin 11/18/2019  . Respiratory distress 11/17/2019  .  Altered mental status   . Respiratory arrest (HCC)   . Facial twitching 09/01/2019  . Stage 3a chronic kidney disease (HCC) 09/01/2019  . DNR (do not resuscitate) 09/01/2019  . COVID-19 07/30/2019  . Essential hypertension 07/30/2019  . Dyslipidemia 07/30/2019  . Pre-syncope 07/30/2019  . Viral gastroenteritis due to [place] agent 09/19/2018  . Acute kidney injury superimposed on CKD (HCC) 09/18/2018  . Diarrhea 09/18/2018  . Urinary tract infection 09/18/2018  . Generalized weakness 09/18/2018  . Arthritis 09/18/2018    Past Surgical History:  Procedure Laterality Date  . ABDOMINAL HYSTERECTOMY    . TOTAL HIP ARTHROPLASTY       OB History   No obstetric history on file.     Family History  Family history unknown: Yes    Social History   Tobacco Use  . Smoking status: Never Smoker  . Smokeless tobacco: Never Used  Vaping Use  . Vaping Use: Never used  Substance Use Topics  . Alcohol use: No  . Drug use: No    Home Medications Prior to Admission medications   Medication Sig Start Date End Date Taking? Authorizing Provider  albuterol (VENTOLIN HFA) 108 (90 Base) MCG/ACT inhaler Inhale 1-2 puffs into the lungs every 4 (four) hours as needed for wheezing or shortness of breath.    Yes [provider]  apixaban (  ELIQUIS) 5 MG TABS tablet Please take 10 mg oral twice daily for next 5 days, then transition to 5 mg oral twice daily from 11/28/2019 Patient taking differently: Take 5 mg by mouth 2 (two) times daily. 11/22/19  Yes Elgergawy, Leana Roe, MD  Ascorbic Acid (VITAMIN C PO) Take 1 tablet by mouth daily.   Yes [provider]  cephALEXin (KEFLEX) 500 MG capsule Take 1 capsule (500 mg total) by mouth 3 (three) times daily for 5 days. 07/03/20 07/08/20 Yes Khiree Bukhari, Stevphen Meuse S, PA  Cholecalciferol (VITAMIN D) 2000 UNITS tablet Take 2,000 Units by mouth daily.   Yes [provider]  gabapentin (NEURONTIN) 100 MG capsule Take 1 capsule (100 mg total) by  mouth 3 (three) times daily. 09/02/19  Yes Burnadette Pop, MD  meclizine (ANTIVERT) 12.5 MG tablet Take 1 tablet (12.5 mg total) by mouth 3 (three) times daily as needed for dizziness. 07/23/18  Yes Pricilla Loveless, MD  metoprolol tartrate (LOPRESSOR) 25 MG tablet Take 0.5 tablets (12.5 mg total) by mouth 2 (two) times daily. Patient taking differently: Take 25 mg by mouth 2 (two) times daily. 02/13/20  Yes Seawell, Jaimie A, DO  pantoprazole (PROTONIX) 40 MG tablet Take 1 tablet (40 mg total) by mouth daily. 11/23/19  Yes Elgergawy, Leana Roe, MD  Polyethyl Glycol-Propyl Glycol (SYSTANE OP) Place 1 drop into both eyes every morning.   Yes [provider]    Allergies    Patient has no known allergies.  Review of Systems   Review of Systems  Unable to perform ROS: Patient nonverbal    Physical Exam Updated Vital Signs BP (!) 110/95   Pulse 74   Temp 98.7 F (37.1 C) (Rectal)   Resp (!) 22   SpO2 98%   Physical Exam Vitals and nursing note reviewed.  Constitutional:      General: She is not in acute distress.    Comments: Patient is 87 year old female does not appear to be in any acute distress.  Nonverbal. Covered in stool.  HENT:     Head: Normocephalic and atraumatic.     Nose: Nose normal.  Eyes:     General: No scleral icterus. Cardiovascular:     Rate and Rhythm: Normal rate and regular rhythm.     Pulses: Normal pulses.     Heart sounds: Normal heart sounds.  Pulmonary:     Effort: Pulmonary effort is normal. No respiratory distress.     Breath sounds: No wheezing.     Comments: Lungs are clear to auscultation all fields.  Soft moaning.  Otherwise no abnormal sounds. Abdominal:     Palpations: Abdomen is soft.     Tenderness: There is no abdominal tenderness. There is no guarding or rebound.  Genitourinary:    Comments: Foley catheter in place Musculoskeletal:     Cervical back: Normal range of motion.     Right lower leg: No edema.     Left lower leg:  No edema.  Skin:    General: Skin is warm and dry.     Capillary Refill: Capillary refill takes less than 2 seconds.     Comments: Some skin breakdown in the sacral area.  There is no fully formed ulcer. Some skin breakdown to the dorsum of bilateral feet.  Right shoulder with some skin breakdown as well.  These wounds were covered with bandages.  They appear clean apart from sacral skin breakdown area which has stool smeared in this area.  Neurological:  Mental Status: She is alert. Mental status is at baseline.     Comments: Per EMS patient is at baseline. Mumbling incoherently. Withdraws from nailbed pressure in all 4 extremities but does not follow commands. No facial droop  Psychiatric:        Mood and Affect: Mood normal.        Behavior: Behavior normal.     ED Results / Procedures / Treatments   Labs (all labs ordered are listed, but only abnormal results are displayed) Labs Reviewed  COMPREHENSIVE METABOLIC PANEL - Abnormal; Notable for the following components:      Result Value   Glucose, Bld 126 (*)    Calcium 8.8 (*)    Total Protein 6.4 (*)    Albumin 2.2 (*)    All other components within normal limits  CBC WITH DIFFERENTIAL/PLATELET - Abnormal; Notable for the following components:   RBC 3.79 (*)    Hemoglobin 11.0 (*)    MCHC 29.8 (*)    RDW 16.4 (*)    Neutro Abs 8.2 (*)    All other components within normal limits  URINALYSIS, ROUTINE W REFLEX MICROSCOPIC - Abnormal; Notable for the following components:   APPearance CLOUDY (*)    Leukocytes,Ua LARGE (*)    RBC / HPF >50 (*)    WBC, UA >50 (*)    Bacteria, UA MANY (*)    All other components within normal limits  URINE CULTURE    EKG EKG Interpretation  Date/Time:  Thursday July 03 2020 16:28:31 EDT Ventricular Rate:  86 PR Interval:  177 QRS Duration: 84 QT Interval:  500 QTC Calculation: 599 R Axis:   28 Text Interpretation: Sinus rhythm Abnormal R-wave progression, early transition  Nonspecific T abnormalities, lateral leads Prolonged QT interval some artifact but overall similar to previous Confirmed by Frederick Peers [id number]) on 07/03/2020 7:13:09 PM   Radiology DG Chest Port 1 View  Result Date: 07/03/2020 CLINICAL DATA:  Altered mental status EXAM: PORTABLE CHEST 1 VIEW COMPARISON:  05/01/2020 FINDINGS: Heart is normal size. Tortuous aorta. No confluent opacities or effusions. No acute bony abnormality. IMPRESSION: No active disease. Electronically Signed   By: Charlett Nose M.D.   On: 07/03/2020 17:11    Procedures Procedures   Medications Ordered in ED Medications  fluconazole (DIFLUCAN) tablet 150 mg (has no administration in time range)  cephALEXin (KEFLEX) capsule 500 mg (has no administration in time range)  sodium chloride 0.9 % bolus 1,000 mL (1,000 mLs Intravenous New Bag/Given 07/03/20 2057)    ED Course  I have reviewed the triage vital signs and the nursing notes.  Pertinent labs & imaging results that were available during my care of the patient were reviewed by me and considered in my medical decision making (see chart for details).  Patient is a 87 year old female presented today because of some concern for gurgling sounds that she has been making.  On physical exam she is well-appearing seems to be chronically demented and on I am unable to conduct much history.  Son at bedside states that she is unchanged from prior.  Does shake her head no to simple questions while he is in the room.  CBC with chronic anemia no other significant abnormalities.  CMP without any significant electrolyte dyscrasias.  Urinalysis relatively unchanged from prior.  Urine culture pending.  Notably patient was treated for urinary tract infection last time she was in ER with similar looking urinalysis.  Clinical Course as of 07/03/20 2240  Thu  Jul 03, 2020  1616 Contacted by Gillian Scarce  "Good afternoon!  Mrs Divelbiss is an active hospice patient with [facility], on service  since Sept 2021 with a terminal dx of cerebral infarct.  I put a note in with some info about a recent UTI that was tx with cipro x 5 days.  Please feel free to call with any questions or concerns- [phone] is our 24h on call line.  8-5 you can reach me or another liaison at [phone].  Thank you all for the care you're providing!" [WF]    Clinical Course User Index [WF] Gailen Shelter, PA   I discussed this case with my attending physician who cosigned this note including patient's presenting symptoms, physical exam, and planned diagnostics and interventions. Attending physician stated agreement with plan or made changes to plan which were implemented.   Attending physician assessed patient at bedside.  Low suspicion for UTI however given patient's indwelling Foley catheter, propensity to UTIs and history of abnormal culture will treat with Keflex.  She has had Keflex sensitive UTI in the past.  She also has yeast in her urine.  Will treat with Diflucan.  PCP will follow up with her.  Foley catheter was also discontinued and reinserted by nursing.  It was apparently last replaced 1 month ago.  MDM Rules/Calculators/A&P                          Final Clinical Impression(s) / ED Diagnoses Final diagnoses:  Dementia without behavioral disturbance, unspecified dementia type (HCC)  Abnormal urinalysis    Rx / DC Orders ED Discharge Orders         Ordered    cephALEXin (KEFLEX) 500 MG capsule  3 times daily        07/03/20 2235           Solon [place], [place] 07/04/20 0026    Laurence Spates, MD 07/07/20 [phone]

## 2020-07-03 NOTE — Discharge Instructions (Addendum)
Please have your hospice provider reassessed patient.    Her urine was without any significant changes from prior however given that she has a Foley catheter it is wise to change the Foley catheter and placed on a course of antibiotics. She also had some yeast which I have given her treatment for in the ER. She can be rechecked by her primary care provider.

## 2020-07-03 NOTE — ED Triage Notes (Signed)
Pt lives at home under the care of her son and is on hospice care. Home health nurse comes to home to check her. DNR was not provided to EMS. Pt is reported to be more SOB. Pt is alert and oriented per norm which is to painful stimuli.

## 2020-07-03 NOTE — Progress Notes (Addendum)
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  Pt was recently treated for a UTI with Cipro 500mg  x 5 days, ordered on 06/23/20.  [facility] on call nurse made visit at son;s request today to assess for "gurgling" breathing, but when nurse arrived EMS was present.    [facility] will continue to follow for any discharge planning needs and to coordinate continuation of hospice care.    Thank you for the opportunity to participate in this patient's care.     06/25/20, BSN, RN [facility] Liaison  [phone]  [phone] (24h on call)

## 2020-07-04 NOTE — ED Notes (Signed)
Old Foley removed. New catheter, 48 French, inserted. Pt tolerated well.

## 2020-07-05 LAB — URINE CULTURE: Culture: >=100000 — AB

## 2020-08-04 ENCOUNTER — Encounter (HOSPITAL_COMMUNITY): Payer: Self-pay | Admitting: Emergency Medicine

## 2020-08-04 ENCOUNTER — Emergency Department (HOSPITAL_COMMUNITY)
Admission: EM | Admit: 2020-08-04 | Discharge: 2020-08-04 | Disposition: A | Discharge status: Home / Self Care | Attending: Emergency Medicine | Admitting: Emergency Medicine

## 2020-08-04 ENCOUNTER — Other Ambulatory Visit: Payer: Self-pay

## 2020-08-04 DIAGNOSIS — R464 Slowness and poor responsiveness: Secondary | ICD-10-CM | POA: Diagnosis present

## 2020-08-04 DIAGNOSIS — Z7901 Long term (current) use of anticoagulants: Secondary | ICD-10-CM | POA: Diagnosis not present

## 2020-08-04 DIAGNOSIS — Z96649 Presence of unspecified artificial hip joint: Secondary | ICD-10-CM | POA: Diagnosis not present

## 2020-08-04 DIAGNOSIS — Z515 Encounter for palliative care: Secondary | ICD-10-CM | POA: Insufficient documentation

## 2020-08-04 DIAGNOSIS — N1831 Chronic kidney disease, stage 3a: Secondary | ICD-10-CM | POA: Diagnosis not present

## 2020-08-04 DIAGNOSIS — Z79899 Other long term (current) drug therapy: Secondary | ICD-10-CM | POA: Diagnosis not present

## 2020-08-04 DIAGNOSIS — F0390 Unspecified dementia without behavioral disturbance: Secondary | ICD-10-CM | POA: Diagnosis not present

## 2020-08-04 DIAGNOSIS — I129 Hypertensive chronic kidney disease with stage 1 through stage 4 chronic kidney disease, or unspecified chronic kidney disease: Secondary | ICD-10-CM | POA: Diagnosis not present

## 2020-08-04 DIAGNOSIS — Z8616 Personal history of COVID-19: Secondary | ICD-10-CM | POA: Insufficient documentation

## 2020-08-04 NOTE — ED Notes (Addendum)
Spoke with Britta Mccreedy - hospice RN [phone]  Called at 1800 wanting to call EMS, RN called out to house  Patient seen by nurse at 1900 after being called by the son reporteing lethargy and mumbling 98 temp 104 HR 18 RR 130/68 BP Hypoactive bowels Urine dark yellow and clear  Current care plan per patient and family per the certificate of terminal illness written by Dr. Garner Nash (PCP), patient is comfort care only on 5/2  Called on 5/4 reporting same symptoms, no changes since then. Son was supposed to call hospice if there were any changes, hospice did not receive a call. On 5/4, doctor put in certificate of terminal illness and discussed that with the son who is her only family.   Per hospice, patient is also no longer swallowing.  Son instructed to empty bag regularly, verbal order for 500 mg cipro BID given 5/4 and ending today

## 2020-08-04 NOTE — ED Notes (Signed)
[facility] contacted for transport 

## 2020-08-04 NOTE — ED Provider Notes (Signed)
[facility]-EMERGENCY DEPT Provider Note   CSN: [id number] Arrival date & time: 08/04/20  0240     History Chief Complaint  Patient presents with  . Failure To Thrive    Robin Moreno is a 87 y.o. female.  HPI Level 5 caveat due to dementia. Brought in by EMS.  Patient's son called 911 after he found her less responsive.  States she was not doing much.  States it seems as if she is more awake now.  Patient is a hospice patient.  Per hospice nurse patient is no longer swallowing.  Decreased oral intake.  Per son patient states that the patient has been eating well though.  As of a week ago reportedly on comfort care only but also started on antibiotics 2 days after that.  Son states that the patient is more awake now and more at her baseline.  Thinks she may have just been sleeping.    Past Medical History:  Diagnosis Date  . Arthritis   . CVA (cerebral vascular accident) (HCC)   . Degenerative disk disease   . Dementia (HCC)   . Hospice care patient   . Hypercholesterolemia   . Hypertension   . Osteoarthritis of right knee   . Renal disorder   . Stage 3a chronic kidney disease (HCC) 09/01/2019    Patient Active Problem List   Diagnosis Date Noted  . Weakness 05/01/2020  . Acute encephalopathy 05/01/2020  . PAF (paroxysmal atrial fibrillation) (HCC) 05/01/2020  . Acute respiratory failure with hypoxia (HCC)   . Endotracheal tube present   . Hypotension   . Failure to thrive in adult 11/24/2019  . Palliative care by specialist   . Goals of care, counseling/discussion   . Advanced directives, counseling/discussion   . DNI (do not intubate)   . Concern about end of life   . Encounter for hospice care discussion   . Somnolence 11/23/2019  . Pulmonary embolism on right (HCC) 11/20/2019  . Pressure injury of skin 11/18/2019  . Respiratory distress 11/17/2019  . Altered mental status   . Respiratory arrest (HCC)   . Facial twitching 09/01/2019  .  Stage 3a chronic kidney disease (HCC) 09/01/2019  . DNR (do not resuscitate) 09/01/2019  . COVID-19 07/30/2019  . Essential hypertension 07/30/2019  . Dyslipidemia 07/30/2019  . Pre-syncope 07/30/2019  . Viral gastroenteritis due to [place] agent 09/19/2018  . Acute kidney injury superimposed on CKD (HCC) 09/18/2018  . Diarrhea 09/18/2018  . Urinary tract infection 09/18/2018  . Generalized weakness 09/18/2018  . Arthritis 09/18/2018    Past Surgical History:  Procedure Laterality Date  . ABDOMINAL HYSTERECTOMY    . TOTAL HIP ARTHROPLASTY       OB History   No obstetric history on file.     Family History  Family history unknown: Yes    Social History   Tobacco Use  . Smoking status: Never Smoker  . Smokeless tobacco: Never Used  Vaping Use  . Vaping Use: Never used  Substance Use Topics  . Alcohol use: No  . Drug use: No    Home Medications Prior to Admission medications   Medication Sig Start Date End Date Taking? Authorizing Provider  albuterol (VENTOLIN HFA) 108 (90 Base) MCG/ACT inhaler Inhale 1-2 puffs into the lungs every 4 (four) hours as needed for wheezing or shortness of breath.     [provider]  apixaban (ELIQUIS) 5 MG TABS tablet Please take 10 mg oral twice daily for  next 5 days, then transition to 5 mg oral twice daily from 11/28/2019 Patient taking differently: Take 5 mg by mouth 2 (two) times daily. 11/22/19   Elgergawy, Leana Roe, MD  Ascorbic Acid (VITAMIN C PO) Take 1 tablet by mouth daily.    [provider]  Cholecalciferol (VITAMIN D) 2000 UNITS tablet Take 2,000 Units by mouth daily.    [provider]  gabapentin (NEURONTIN) 100 MG capsule Take 1 capsule (100 mg total) by mouth 3 (three) times daily. 09/02/19   Burnadette Pop, MD  meclizine (ANTIVERT) 12.5 MG tablet Take 1 tablet (12.5 mg total) by mouth 3 (three) times daily as needed for dizziness. 07/23/18   Pricilla Loveless, MD  metoprolol tartrate (LOPRESSOR) 25  MG tablet Take 0.5 tablets (12.5 mg total) by mouth 2 (two) times daily. Patient taking differently: Take 25 mg by mouth 2 (two) times daily. 02/13/20   Seawell, Jaimie A, DO  pantoprazole (PROTONIX) 40 MG tablet Take 1 tablet (40 mg total) by mouth daily. 11/23/19   Elgergawy, Leana Roe, MD  Polyethyl Glycol-Propyl Glycol (SYSTANE OP) Place 1 drop into both eyes every morning.    [provider]    Allergies    Patient has no known allergies.  Review of Systems   Review of Systems  Unable to perform ROS: Dementia    Physical Exam Updated Vital Signs BP 127/79   Pulse 71   Temp 98.1 F (36.7 C)   Resp (!) 22   SpO2 98%   Physical Exam Vitals and nursing note reviewed.  Constitutional:      Appearance: She is not ill-appearing.  Eyes:     General: No scleral icterus. Cardiovascular:     Rate and Rhythm: Regular rhythm.  Pulmonary:     Breath sounds: No wheezing or rhonchi.  Abdominal:     Tenderness: There is no guarding.  Musculoskeletal:        General: No tenderness.     Cervical back: Neck supple.  Skin:    Comments: Dressings on top of bilateral feet.  Neurological:     Mental Status: Mental status is at baseline.     Comments: Nonverbal for me.  Sitting in bed.  Will occasionally moan.     ED Results / Procedures / Treatments   Labs (all labs ordered are listed, but only abnormal results are displayed) Labs Reviewed - No data to display  EKG None  Radiology No results found.  Procedures Procedures   Medications Ordered in ED Medications - No data to display  ED Course  I have reviewed the triage vital signs and the nursing notes.  Pertinent labs & imaging results that were available during my care of the patient were reviewed by me and considered in my medical decision making (see chart for details).    MDM Rules/Calculators/A&P                          Patient presents from home.  Is on hospice and reportedly comfort care only.   Reportedly had mental status change at home that is increased.  We have discussed with hospice and states that she is comfort care only and has been eating less.  Made comfort care on the second however reportedly started antibiotics on the fourth.  At this point do not think she needs further work-up if she is comfort care only.  Mental status improved.  Will discharge back home and family needs  more discussion about goals of care Final Clinical Impression(s) / ED Diagnoses Final diagnoses:  Palliative care status    Rx / DC Orders ED Discharge Orders    None       Benjiman Core, MD 08/04/20 [phone]

## 2020-08-04 NOTE — ED Notes (Addendum)
Hospice contacted regarding patient and patient's care/wishes, waiting for call back from on-call RN

## 2020-08-04 NOTE — ED Triage Notes (Signed)
Patient arrives via EMS. Patient is under hospice care with end stage dementia. Per family, patient over the last 3 days has been less verbal. Hospice saw patient this morning and attempted to explain the decline to family.

## 2020-08-04 NOTE — ED Notes (Signed)
Patients father, Robin Moreno, was waiting in the hallway asking for the nurse. This RN informed patient that Huntley Dec was busy in another patients room, but I would be happy to help. He said NO I need HER nurse now. This RN asked again what the patient needed because I can help. He said his nurse has not been in the room in 2 hours and that we needed to watch his mom. This RN told patient that we are monitoring her on the vitals screen. He said ok but he needed her to have the [facility] here immediately. RN told patient that it would be some time before [facility] arrived and that he can go home, we will watch the patient and monitor her vitals. Patients father slammed the door.

## 2020-08-04 NOTE — ED Notes (Signed)
[facility] at bedside for transport.
